# Patient Record
Sex: Male | Born: 1935 | Race: White | Hispanic: No | Marital: Married | Smoking: Former smoker
Health system: Southern US, Community
[De-identification: ages and names within clinical notes are randomized; demographics above are authoritative.]

## PROBLEM LIST (undated history)

## (undated) DIAGNOSIS — C439 Malignant melanoma of skin, unspecified: Secondary | ICD-10-CM

## (undated) DIAGNOSIS — IMO0001 Reserved for inherently not codable concepts without codable children: Secondary | ICD-10-CM

## (undated) DIAGNOSIS — E119 Type 2 diabetes mellitus without complications: Secondary | ICD-10-CM

## (undated) DIAGNOSIS — K219 Gastro-esophageal reflux disease without esophagitis: Secondary | ICD-10-CM

## (undated) DIAGNOSIS — H547 Unspecified visual loss: Secondary | ICD-10-CM

## (undated) DIAGNOSIS — R600 Localized edema: Secondary | ICD-10-CM

## (undated) DIAGNOSIS — I10 Essential (primary) hypertension: Secondary | ICD-10-CM

## (undated) HISTORY — PX: LUNG SURGERY: SHX703

## (undated) HISTORY — PX: INGUINAL HERNIA REPAIR: SUR1180

## (undated) HISTORY — PX: EYE SURGERY: SHX253

---

## 1999-01-02 ENCOUNTER — Ambulatory Visit (HOSPITAL_COMMUNITY): Admission: RE | Admit: 1999-01-02 | Discharge: 1999-01-02 | Payer: Self-pay

## 1999-01-30 ENCOUNTER — Encounter: Payer: Self-pay | Admitting: Thoracic Surgery

## 1999-01-31 ENCOUNTER — Inpatient Hospital Stay (HOSPITAL_COMMUNITY): Admission: RE | Admit: 1999-01-31 | Discharge: 1999-02-04 | Payer: Self-pay | Admitting: Thoracic Surgery

## 1999-01-31 ENCOUNTER — Encounter: Payer: Self-pay | Admitting: Thoracic Surgery

## 1999-02-01 ENCOUNTER — Encounter: Payer: Self-pay | Admitting: Thoracic Surgery

## 1999-02-02 ENCOUNTER — Encounter: Payer: Self-pay | Admitting: Thoracic Surgery

## 1999-02-03 ENCOUNTER — Encounter: Payer: Self-pay | Admitting: Thoracic Surgery

## 1999-02-07 ENCOUNTER — Encounter: Payer: Self-pay | Admitting: Thoracic Surgery

## 1999-02-07 ENCOUNTER — Encounter: Admission: RE | Admit: 1999-02-07 | Discharge: 1999-02-07 | Payer: Self-pay | Admitting: Thoracic Surgery

## 1999-03-14 ENCOUNTER — Encounter: Payer: Self-pay | Admitting: Thoracic Surgery

## 1999-03-14 ENCOUNTER — Encounter: Admission: RE | Admit: 1999-03-14 | Discharge: 1999-03-14 | Payer: Self-pay | Admitting: Thoracic Surgery

## 1999-05-16 ENCOUNTER — Encounter: Payer: Self-pay | Admitting: Thoracic Surgery

## 1999-05-16 ENCOUNTER — Encounter: Admission: RE | Admit: 1999-05-16 | Discharge: 1999-05-16 | Payer: Self-pay | Admitting: Thoracic Surgery

## 1999-09-14 ENCOUNTER — Encounter: Payer: Self-pay | Admitting: Thoracic Surgery

## 1999-09-14 ENCOUNTER — Encounter: Admission: RE | Admit: 1999-09-14 | Discharge: 1999-09-14 | Payer: Self-pay | Admitting: Thoracic Surgery

## 2000-03-14 ENCOUNTER — Ambulatory Visit (HOSPITAL_BASED_OUTPATIENT_CLINIC_OR_DEPARTMENT_OTHER): Admission: RE | Admit: 2000-03-14 | Discharge: 2000-03-14 | Payer: Self-pay | Admitting: Surgery

## 2000-03-18 ENCOUNTER — Encounter: Payer: Self-pay | Admitting: Thoracic Surgery

## 2000-03-18 ENCOUNTER — Encounter: Admission: RE | Admit: 2000-03-18 | Discharge: 2000-03-18 | Payer: Self-pay | Admitting: Thoracic Surgery

## 2000-09-16 ENCOUNTER — Encounter: Payer: Self-pay | Admitting: Thoracic Surgery

## 2000-09-16 ENCOUNTER — Encounter: Admission: RE | Admit: 2000-09-16 | Discharge: 2000-09-16 | Payer: Self-pay | Admitting: Thoracic Surgery

## 2001-03-18 ENCOUNTER — Encounter: Payer: Self-pay | Admitting: Thoracic Surgery

## 2001-03-18 ENCOUNTER — Encounter: Admission: RE | Admit: 2001-03-18 | Discharge: 2001-03-18 | Payer: Self-pay | Admitting: Thoracic Surgery

## 2001-09-15 ENCOUNTER — Encounter: Payer: Self-pay | Admitting: Thoracic Surgery

## 2001-09-15 ENCOUNTER — Encounter: Admission: RE | Admit: 2001-09-15 | Discharge: 2001-09-15 | Payer: Self-pay | Admitting: Thoracic Surgery

## 2002-03-11 ENCOUNTER — Encounter: Payer: Self-pay | Admitting: Thoracic Surgery

## 2002-03-11 ENCOUNTER — Encounter: Admission: RE | Admit: 2002-03-11 | Discharge: 2002-03-11 | Payer: Self-pay | Admitting: Thoracic Surgery

## 2003-03-15 ENCOUNTER — Encounter: Admission: RE | Admit: 2003-03-15 | Discharge: 2003-03-15 | Payer: Self-pay | Admitting: Thoracic Surgery

## 2004-02-28 ENCOUNTER — Encounter: Admission: RE | Admit: 2004-02-28 | Discharge: 2004-02-28 | Payer: Self-pay | Admitting: Thoracic Surgery

## 2005-03-13 ENCOUNTER — Encounter: Admission: RE | Admit: 2005-03-13 | Discharge: 2005-03-13 | Payer: Self-pay | Admitting: Thoracic Surgery

## 2006-03-18 ENCOUNTER — Ambulatory Visit: Payer: Self-pay | Admitting: Thoracic Surgery

## 2006-03-18 ENCOUNTER — Encounter: Admission: RE | Admit: 2006-03-18 | Discharge: 2006-03-18 | Payer: Self-pay | Admitting: Thoracic Surgery

## 2010-04-30 ENCOUNTER — Other Ambulatory Visit: Payer: Self-pay | Admitting: Dermatology

## 2010-10-04 ENCOUNTER — Other Ambulatory Visit: Payer: Self-pay | Admitting: Dermatology

## 2013-02-16 ENCOUNTER — Other Ambulatory Visit: Payer: Self-pay | Admitting: Dermatology

## 2014-02-16 ENCOUNTER — Other Ambulatory Visit: Payer: Self-pay | Admitting: Dermatology

## 2014-05-12 ENCOUNTER — Other Ambulatory Visit: Payer: Self-pay | Admitting: Dermatology

## 2015-07-17 ENCOUNTER — Other Ambulatory Visit: Payer: Self-pay | Admitting: Urology

## 2015-07-24 ENCOUNTER — Encounter (HOSPITAL_COMMUNITY): Payer: Self-pay | Admitting: Emergency Medicine

## 2015-07-24 ENCOUNTER — Emergency Department (HOSPITAL_COMMUNITY): Payer: Medicare Other

## 2015-07-24 ENCOUNTER — Observation Stay (HOSPITAL_COMMUNITY)
Admission: EM | Admit: 2015-07-24 | Discharge: 2015-07-26 | Disposition: A | Discharge status: Home / Self Care | Payer: Medicare Other | Attending: Internal Medicine | Admitting: Internal Medicine

## 2015-07-24 DIAGNOSIS — I10 Essential (primary) hypertension: Secondary | ICD-10-CM | POA: Diagnosis not present

## 2015-07-24 DIAGNOSIS — I2510 Atherosclerotic heart disease of native coronary artery without angina pectoris: Secondary | ICD-10-CM | POA: Insufficient documentation

## 2015-07-24 DIAGNOSIS — E119 Type 2 diabetes mellitus without complications: Secondary | ICD-10-CM | POA: Insufficient documentation

## 2015-07-24 DIAGNOSIS — Z85820 Personal history of malignant melanoma of skin: Secondary | ICD-10-CM | POA: Diagnosis not present

## 2015-07-24 DIAGNOSIS — R0602 Shortness of breath: Secondary | ICD-10-CM | POA: Diagnosis present

## 2015-07-24 DIAGNOSIS — K5900 Constipation, unspecified: Secondary | ICD-10-CM | POA: Diagnosis not present

## 2015-07-24 DIAGNOSIS — Z87891 Personal history of nicotine dependence: Secondary | ICD-10-CM | POA: Diagnosis not present

## 2015-07-24 DIAGNOSIS — R0600 Dyspnea, unspecified: Secondary | ICD-10-CM | POA: Diagnosis present

## 2015-07-24 DIAGNOSIS — D509 Iron deficiency anemia, unspecified: Principal | ICD-10-CM | POA: Insufficient documentation

## 2015-07-24 DIAGNOSIS — D649 Anemia, unspecified: Secondary | ICD-10-CM | POA: Insufficient documentation

## 2015-07-24 HISTORY — DX: Type 2 diabetes mellitus without complications: E11.9

## 2015-07-24 HISTORY — DX: Malignant melanoma of skin, unspecified: C43.9

## 2015-07-24 HISTORY — DX: Essential (primary) hypertension: I10

## 2015-07-24 LAB — CBC WITH DIFFERENTIAL/PLATELET
BASOS PCT: 0 %
Basophils Absolute: 0 10*3/uL (ref 0.0–0.1)
EOS PCT: 2 %
Eosinophils Absolute: 0.2 10*3/uL (ref 0.0–0.7)
HEMATOCRIT: 27.6 % — AB (ref 39.0–52.0)
HEMOGLOBIN: 8 g/dL — AB (ref 13.0–17.0)
LYMPHS PCT: 18 %
Lymphs Abs: 1.7 10*3/uL (ref 0.7–4.0)
MCH: 21.6 pg — ABNORMAL LOW (ref 26.0–34.0)
MCHC: 29 g/dL — ABNORMAL LOW (ref 30.0–36.0)
MCV: 74.4 fL — AB (ref 78.0–100.0)
MONOS PCT: 8 %
Monocytes Absolute: 0.8 10*3/uL (ref 0.1–1.0)
NEUTROS PCT: 72 %
Neutro Abs: 6.8 10*3/uL (ref 1.7–7.7)
PLATELETS: 269 10*3/uL (ref 150–400)
RBC: 3.71 MIL/uL — AB (ref 4.22–5.81)
RDW: 16.7 % — ABNORMAL HIGH (ref 11.5–15.5)
WBC: 9.5 10*3/uL (ref 4.0–10.5)

## 2015-07-24 LAB — POC OCCULT BLOOD, ED: FECAL OCCULT BLD: NEGATIVE

## 2015-07-24 LAB — LIPID PANEL
CHOL/HDL RATIO: 3.5 ratio
CHOLESTEROL: 150 mg/dL (ref 0–200)
HDL: 43 mg/dL (ref >40)
LDL Cholesterol: 93 mg/dL (ref 0–99)
Triglycerides: 68 mg/dL (ref <150)
VLDL: 14 mg/dL (ref 0–40)

## 2015-07-24 LAB — COMPREHENSIVE METABOLIC PANEL
ALBUMIN: 3.2 g/dL — AB (ref 3.5–5.0)
ALT: 18 U/L (ref 17–63)
ANION GAP: 6 (ref 5–15)
AST: 24 U/L (ref 15–41)
Alkaline Phosphatase: 88 U/L (ref 38–126)
BUN: 17 mg/dL (ref 6–20)
CHLORIDE: 110 mmol/L (ref 101–111)
CO2: 22 mmol/L (ref 22–32)
Calcium: 9.6 mg/dL (ref 8.9–10.3)
Creatinine, Ser: 0.92 mg/dL (ref 0.61–1.24)
GFR calc non Af Amer: >60 mL/min (ref >60)
GLUCOSE: 117 mg/dL — AB (ref 65–99)
Potassium: 4 mmol/L (ref 3.5–5.1)
SODIUM: 138 mmol/L (ref 135–145)
Total Bilirubin: 0.4 mg/dL (ref 0.3–1.2)
Total Protein: 6.7 g/dL (ref 6.5–8.1)

## 2015-07-24 LAB — GLUCOSE, CAPILLARY: GLUCOSE-CAPILLARY: 143 mg/dL — AB (ref 65–99)

## 2015-07-24 LAB — TROPONIN I
Troponin I: <0.03 ng/mL (ref <0.03)
Troponin I: <0.03 ng/mL (ref <0.03)

## 2015-07-24 LAB — FERRITIN: Ferritin: 5 ng/mL — ABNORMAL LOW (ref 24–336)

## 2015-07-24 LAB — BRAIN NATRIURETIC PEPTIDE: B Natriuretic Peptide: 293 pg/mL — ABNORMAL HIGH (ref 0.0–100.0)

## 2015-07-24 LAB — D-DIMER, QUANTITATIVE: D-Dimer, Quant: 2.16 ug/mL-FEU — ABNORMAL HIGH (ref 0.00–0.50)

## 2015-07-24 MED ORDER — PANTOPRAZOLE SODIUM 40 MG PO TBEC
40.0000 mg | DELAYED_RELEASE_TABLET | Freq: Every day | ORAL | Status: DC
  Administered 2015-07-25 – 2015-07-26 (×2): 40 mg via ORAL
  Filled 2015-07-24 (×2): qty 1

## 2015-07-24 MED ORDER — MOMETASONE FURO-FORMOTEROL FUM 200-5 MCG/ACT IN AERO
2.0000 | INHALATION_SPRAY | Freq: Two times a day (BID) | RESPIRATORY_TRACT | Status: DC
  Administered 2015-07-25: 2 via RESPIRATORY_TRACT
  Filled 2015-07-24 (×3): qty 8.8

## 2015-07-24 MED ORDER — DOXAZOSIN MESYLATE 8 MG PO TABS
4.0000 mg | ORAL_TABLET | Freq: Every day | ORAL | Status: DC
  Administered 2015-07-25 – 2015-07-26 (×2): 4 mg via ORAL
  Filled 2015-07-24 (×2): qty 1

## 2015-07-24 MED ORDER — SODIUM CHLORIDE 0.9% FLUSH
3.0000 mL | Freq: Two times a day (BID) | INTRAVENOUS | Status: DC
  Administered 2015-07-24 – 2015-07-26 (×5): 3 mL via INTRAVENOUS

## 2015-07-24 MED ORDER — LOSARTAN POTASSIUM 50 MG PO TABS
100.0000 mg | ORAL_TABLET | Freq: Every day | ORAL | Status: DC
Start: 2015-07-25 — End: 2015-07-26
  Administered 2015-07-25 – 2015-07-26 (×2): 100 mg via ORAL
  Filled 2015-07-24 (×2): qty 2

## 2015-07-24 MED ORDER — INSULIN GLARGINE 100 UNIT/ML ~~LOC~~ SOLN
10.0000 [IU] | Freq: Every day | SUBCUTANEOUS | Status: DC
  Administered 2015-07-24: 10 [IU] via SUBCUTANEOUS
  Filled 2015-07-24 (×2): qty 0.1

## 2015-07-24 MED ORDER — IOPAMIDOL (ISOVUE-370) INJECTION 76%
INTRAVENOUS | Status: AC
  Administered 2015-07-24: 75 mL via INTRAVENOUS
  Filled 2015-07-24: qty 100

## 2015-07-24 MED ORDER — ATORVASTATIN CALCIUM 40 MG PO TABS
40.0000 mg | ORAL_TABLET | Freq: Every day | ORAL | Status: DC
  Administered 2015-07-24 – 2015-07-25 (×2): 40 mg via ORAL
  Filled 2015-07-24 (×2): qty 1

## 2015-07-24 MED ORDER — FUROSEMIDE 10 MG/ML IJ SOLN: 20.0000 mg | Freq: Once | INTRAMUSCULAR | Status: DC

## 2015-07-24 MED ORDER — ENOXAPARIN SODIUM 40 MG/0.4ML ~~LOC~~ SOLN
40.0000 mg | SUBCUTANEOUS | Status: DC
  Administered 2015-07-24 – 2015-07-25 (×2): 40 mg via SUBCUTANEOUS
  Filled 2015-07-24 (×2): qty 0.4

## 2015-07-24 MED ORDER — INSULIN ASPART 100 UNIT/ML ~~LOC~~ SOLN
0.0000 [IU] | Freq: Three times a day (TID) | SUBCUTANEOUS | Status: DC
Start: 2015-07-24 — End: 2015-07-26
  Administered 2015-07-25: 5 [IU] via SUBCUTANEOUS
  Administered 2015-07-25: 2 [IU] via SUBCUTANEOUS
  Administered 2015-07-25 – 2015-07-26 (×2): 5 [IU] via SUBCUTANEOUS
  Administered 2015-07-26: 3 [IU] via SUBCUTANEOUS

## 2015-07-24 MED ORDER — AMLODIPINE BESYLATE 10 MG PO TABS
10.0000 mg | ORAL_TABLET | Freq: Every day | ORAL | Status: DC
  Administered 2015-07-25 – 2015-07-26 (×2): 10 mg via ORAL
  Filled 2015-07-24 (×2): qty 1

## 2015-07-24 MED ORDER — INSULIN ASPART 100 UNIT/ML ~~LOC~~ SOLN
0.0000 [IU] | Freq: Every day | SUBCUTANEOUS | Status: DC
Start: 2015-07-24 — End: 2015-07-26
  Administered 2015-07-24 – 2015-07-25 (×2): 3 [IU] via SUBCUTANEOUS

## 2015-07-24 NOTE — ED Notes (Signed)
Chaperoned Carmelina Paddock', RN with a rectal examination and collection of stool on patient; daughter stepped outside of room

## 2015-07-24 NOTE — H&P (Signed)
Date: 07/24/2015               Patient Name:  Martin Leblanc MRN: [id number]  DOB: 1929-05-04 Age / Sex: 82 y.o., male   PCP: No primary care provider on file.         Medical Service: Internal Medicine Teaching Service         Attending Physician: Dr. Oval Linsey, MD    First Contact: Melissa Montane, MS4 Pager: [phone]  Second Contact: Dr. Arcelia Jew Pager: [phone]       After Hours (After 5p/  First Contact Pager: [phone]  weekends / holidays): Second Contact Pager: [phone]   Chief Complaint: "I feel short of breath"  History of Present Illness: [name] is an 82yo man with PMHx of HTN, type 2 DM, and malignant melanoma treated 25 years ago who presents today with shortness of breath. He reports worsening SOB over the last 1.5 years and that he has noticed he has to limit his activity level. He describes that even taking a bath recently caused him to have SOB and that "it was a lot of work." He also reports a "fullness" in his chest that occurred at 3AM the night before, which woke him from sleep. He describes this discomfort as more pressure-like than pain, 3/10 in severity, substernal, and non-radiating. He notes experiencing this chest fullness intermittently over the past 6 months. He reports he took aspirin the night before, which he believes helped alleviate the pain.  He states that he currently feels completely fine and is no longer experiencing this discomfort.  He denies associated nausea or diaphoresis. He denies any orthopnea.  Meds: No current facility-administered medications for this encounter.   Current Outpatient Prescriptions  Medication Sig Dispense Refill  . amLODipine-benazepril (LOTREL) 10-20 MG capsule Take 1 capsule by mouth daily.    Marland Kitchen antiseptic oral rinse (BIOTENE) LIQD 15 mLs by Mouth Rinse route as needed for dry mouth.    Marland Kitchen atorvastatin (LIPITOR) 20 MG tablet Take 20 mg by mouth daily.    Marland Kitchen doxazosin (CARDURA) 4 MG tablet Take 4 mg by mouth daily.      Marland Kitchen HUMALOG MIX 50/50 KWIKPEN (50-50) 100 UNIT/ML Kwikpen Inject 40-50 Units into the skin See admin instructions. Pt uses 40 units at breakfast and dinner and if BS is high, uses 50 units at bedtime    . Hypromellose (ARTIFICIAL TEARS OP) Place 1 drop into both eyes daily as needed (dry eyes).    Marland Kitchen losartan (COZAAR) 100 MG tablet Take 100 mg by mouth daily.    Marland Kitchen omeprazole (PRILOSEC) 20 MG capsule Take 20 mg by mouth daily.    . solifenacin (VESICARE) 10 MG tablet Take 10 mg by mouth daily.    Marland Kitchen Specialty Vitamins Products (PROSTATE PO) Take 1 capsule by mouth daily.    . SYMBICORT 160-4.5 MCG/ACT inhaler Inhale 1-2 puffs into the lungs 2 (two) times daily.  4    Allergies: Allergies as of 07/24/2015  . (No Known Allergies)   Past Medical History  Diagnosis Date  . Diabetes mellitus without complication ([place])   . Hypertension   . Malignant melanoma ([place])     s/p treatment 25 yrs ago    Family History:  Diabetes in maternal grandmother. Emphysema in father.  Social History:  Lives at home by himself. Retired, used to work as a Dealer. Previous smoker, 0.5 ppd for 20 years, quit 45 years ago. Denies any alcohol use, last drank  40+ years ago. Denies any illicit drug use.  Review of Systems: A complete ROS was negative except as per HPI.   Physical Exam: Blood pressure 119/73, pulse 89, temperature 98.8 F (37.1 C), temperature source Oral, resp. rate 18, SpO2 97 %. General: pleasant elderly man sitting up in bed, NAD HEENT: [place]/AT, EOMI, sclera anicteric, erythematous pharynx, mucus membranes dry CV: heart sounds distant, RRR, no m/g/r Pulm: mild crackles at bases, breaths non-labored on room air Abd: BS+, soft, obese, non-tender Ext: warm, 2+ pitting edema in lower extremities bilaterally Neuro: alert and oriented x 3  Labs:  Hgb 8.0, MCV 74 Cr 0.92 BNP 293 D-dimer 2.16 Trop neg x 1 FOBT neg Ferritin 5  EKG: Normal sinus rhythm. No ST or T wave changes.   CXR:  Increased masslike thickening along the right chest wall  CT Angio Chest: No PE. Right lower scarring, atelectasis, and small effusion. Based on prior x-rays most likely chronic. 3 cm rounded low-attenuation lesion in RLL, possible loculated pleural fluid. Emphysematous changes. Borderline mediastinal and hilar lymph nodes. CT chest in 3 months to re-evaluate lymph nodes and RLL lesion recommended.   Assessment & Plan by Problem:  Dyspnea on Exertion with Chest Discomfort: Mr. Polek presents with a 1.5 year history of worsening DOE that has acutely worsened over the past few weeks. Additionally, he is having chest discomfort and PND. He has 2+ edema in his lower extremities and mild crackles at his lung bases, but does not seem overtly volume overloaded. BNP only 293 and chest x-ray without pulmonary edema. I am concerned that his symptoms may be related to a cardiac etiology. He has cardiac risk factors including HTN and type 2 DM. His chest discomfort seems more pressure-like in nature which is concerning for CAD. Initial troponin negative and EKG without ischemic changes. His DOE could represent new onset heart failure as well. Will trend serial troponins and repeat an EKG in the morning. His symptoms could also be related to his iron deficiency anemia. I do not have any baseline hemoglobin on him to determine if he has dropped acutely but he denied any bleeding.  - Trend troponins - Repeat EKG in AM - Obtain Echocardiogram to assess LV function - Cardiac monitoring - Can consider giving Lasix 20 mg IV x 1 if becomes SOB - Continue home Amlodipine and Losartan - Continue home Lipitor - Trend weights and I&Os - Risk factor modification: check HbA1c and lipid panel   Iron Deficiency Anemia: Hemoglobin 8.0 on admission with MCV 74. His ferritin is 5. Reports he had "anemia as a teenager but then it went away." He reports previously being on iron supplementation but is not on any currently. He  reports he had a colonoscopy 5-6 years ago and had a few polyps removed which he believes were benign. Denies any melena, hematochezia, hematemesis, hemoptysis, or hematuria. Could be contributing to his DOE.  - Would benefit from repeat colonoscopy as outpatient - Can consider restarting oral iron therapy - CBC in AM  HTN: BPs stable in [id number] systolic. He is on Amlodipine-Benazepril combination pill and Losartan at home. - Will continue home Amlodipine and Losartan for tomorrow - Stop Benazepril. Unclear why on ACE and ARB.   Type 2 DM: Last A1c unknown. Glucose 117 on admission. He reports taking Humalog 50/50 mix 40 units BID with meals. He will occasionally take 50 units if blood sugar elevated.  - Check A1c - Place on Lantus 10 units QHS and sensitive  ISS  - Monitor blood sugars and adjust insulin accordingly   Diet: Heart healthy  VTE PPx: Lovenox SQ Dispo: Admit patient to Observation with expected length of stay less than 2 midnights.  Signed: Juliet Rude, MD 07/24/2015, 3:05 PM  Pager: [phone]

## 2015-07-24 NOTE — ED Notes (Addendum)
Patients Daughter Marisa Cyphers [phone]

## 2015-07-24 NOTE — ED Notes (Signed)
Patient present today with complaints of SOB. States he its been going on for 1 year and has been getting progressively worst. Patient states seen PCP32 months ago  and was given medications and no help. Patient speaking in full sentences. States feels a like SOB on arrival.

## 2015-07-24 NOTE — ED Notes (Signed)
Patient being transported at this time to Radiology Department

## 2015-07-24 NOTE — ED Provider Notes (Signed)
CSN: [id number]     Arrival date & time 07/24/15  0715 History   First MD Initiated Contact with Patient 07/24/15 [phone]     Chief Complaint  Patient presents with  . 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 The patient reports a one year history of progressively worsening shortness of breath which seems to be getting worse recently. His shortness of breath is worse with exertion. He endorses paroxysmal nocturnal dyspnea and some orthopnea. No significant lower extremity edema or changes in his weight. No fevers. Mild, chronic cough. He reports some chest "fullness" that occurred around 3 AM this morning, for which he took a full aspirin. No chest pain currently. He reports a distant history of smoking. He reports a chronic history of dry mouth. He denies any black or bloody stools, or any hematuria.  Patient is a 82 y.o. male presenting with shortness of breath. The history is provided by the patient.  Shortness of Breath   Past Medical History  Diagnosis Date  . Diabetes mellitus without complication ([place])   . Hypertension    No past surgical history on file. No family history on file. Social History  Substance Use Topics  . Smoking status: Former Research scientist (life sciences)  . Smokeless tobacco: None  . Alcohol Use: Yes    Review of Systems  Respiratory: Positive for shortness of breath.    10 Systems reviewed and are negative for acute change except as noted in the HPI.    Allergies  Review of patient's allergies indicates no known allergies.  Home Medications   Prior to Admission medications   Not on File   BP 114/64 mmHg  Pulse 86  Temp(Src) 98.8 F (37.1 C) (Oral)  Resp 17  SpO2 95% Physical Exam  Constitutional: He is  oriented to person, place, and time. He appears well-developed and well-nourished. No distress.  HENT:  Head: Normocephalic and atraumatic.  Very dry mouth and red soft palate  Eyes: Conjunctivae are normal.  L pupil irregular, R pupil round and reactive to light  Neck: Neck supple.  Cardiovascular: Normal rate and regular rhythm.   Murmur heard.  Systolic murmur is present with a grade of 3/6  Pulmonary/Chest: Effort normal and breath sounds normal. He has no wheezes.  Abdominal: Soft. Bowel sounds are normal. He exhibits no distension. There is no tenderness.  Musculoskeletal: He exhibits no edema.  Neurological: He is alert and oriented to person, place, and time.  Fluent speech  Skin: Skin is warm and dry.  Psychiatric: He has a normal mood and affect. Judgment normal.  Nursing note and vitals reviewed.   ED Course  Procedures (including critical care time) Labs Review Labs Reviewed  COMPREHENSIVE METABOLIC PANEL - Abnormal; Notable for the following:    Glucose, Bld 117 (*)    Albumin 3.2 (*)    All other components within normal limits  BRAIN NATRIURETIC PEPTIDE - Abnormal; Notable for the following:    B Natriuretic Peptide 293.0 (*)    All other components within normal limits  CBC WITH DIFFERENTIAL/PLATELET - Abnormal; Notable for the following:    RBC 3.71 (*)    Hemoglobin 8.0 (*)    HCT 27.6 (*)    MCV 74.4 (*)    MCH 21.6 (*)    MCHC 29.0 (*)  RDW 16.7 (*)    All other components within normal limits  D-DIMER, QUANTITATIVE (NOT AT [facility]) - Abnormal; Notable for the following:    D-Dimer, Quant 2.16 (*)    All other components within normal limits  TROPONIN I  POC OCCULT BLOOD, ED    Imaging Review Dg Chest 2 View  07/24/2015  CLINICAL DATA:  Shortness of breath. EXAM: CHEST  2 VIEW COMPARISON:  Chest radiograph 03/18/2006 FINDINGS: Along the RIGHT chest wall there is increase masslike thickening at site of prior post surgical change measuring 7.3 cm  compared to 2.7 cm. Small 2 cm locule of subcutaneous gas inferior to this chest wall expansion. Mild blunting of the RIGHT costophrenic angle. Cardiac silhouette is normal. Central hiatal hernia. Chronic bronchitic markings and basilar atelectasis. No pneumothorax. IMPRESSION: Increased masslike thickening along the RIGHT chest wall. Recommend CT of thorax with contrast for further characterization. Electronically Signed   By: Suzy Bouchard M.D.   On: 07/24/2015 08:21   Ct Angio Chest Pe W/cm &/or Wo Cm  07/24/2015  CLINICAL DATA:  Sudden onset of severe midline chest pain at 3:30 this morning. Chronic shortness of breath. History of melanoma. History of previous right-sided thoracic surgery. EXAM: CT ANGIOGRAPHY CHEST WITH CONTRAST TECHNIQUE: Multidetector CT imaging of the chest was performed using the standard protocol during bolus administration of intravenous contrast. Multiplanar CT image reconstructions and MIPs were obtained to evaluate the vascular anatomy. CONTRAST:  70 cc Isovue 370 COMPARISON:  Multiple prior chest x-rays. Prior chest CT from 01/02/1999 is not available. FINDINGS: Cardiovascular: The heart is normal in size for age. No pericardial effusion. There is tortuosity, ectasia and dense atherosclerotic calcification involving the thoracic aorta. No dissection or focal aneurysm. Calcifications also noted at the aortic valve and mitral valve annulus. Scattered coronary artery calcifications. The pulmonary arterial tree is fairly well opacified. No filling defects to suggest pulmonary embolism. Mediastinum/Nodes: Numerous small mediastinal and hilar lymph nodes. There is a larger 17 mm subcarinal node and borderline infrahilar lymph nodes bilaterally. 10 mm right hilar node on image 56. 9 mm infrahilar node on image number 51. Lungs/Pleura: Extensive pleural and parenchymal scarring changes in the right lower lobe likely related to prior surgery. There could be superimposed atelectasis or  infiltrate. There are also small bilateral pleural effusions. Rounded right lower lobe pulmonary lesion measures water attenuation and may be a loculated pleural fluid collection. Could not totally exclude the possibility of a necrotic nodule. It measures 32 mm on image number 75. Underline emphysematous changes and areas of pulmonary scarring. Upper Abdomen: No significant upper abdominal findings. Musculoskeletal: No significant bony findings. Probable postoperative scarring type changes involving the right chest wall musculature with fatty atrophy. This mainly involves the serratus anterior muscle. Review of the MIP images confirms the above findings. IMPRESSION: 1. No CT findings for pulmonary embolism. 2. Right lower scarring, atelectasis and small effusion. Based on prior chest x-rays most of this is likely chronic but could not exclude superimposed atelectasis or infiltrate. 3. 3 cm rounded low-attenuation lesion in the right lower lobe measures water attenuation and may be loculated pleural fluid. A necrotic nodule is also possible but less likely. 4. Emphysematous changes. 5. Borderline mediastinal and hilar lymph nodes. 6. Small to moderate-sized hiatal hernia. 7. Recommend short-term followup chest CT (3 months) to re-evaluate the lymph nodes and right lower lobe lesion. Electronically Signed   By: Marijo Sanes M.D.   On: 07/24/2015 09:57   I have personally reviewed  and evaluated these lab results as part of my medical decision-making.   EKG Interpretation   Date/Time:  Monday July 24 2015 07:26:09 EDT Ventricular Rate:  92 PR Interval:    QRS Duration: 89 QT Interval:  353 QTC Calculation: 437 R Axis:   59 Text Interpretation:  Sinus rhythm Borderline repolarization abnormality  No previous ECGs available Confirmed by [place] Welcome MD, Viliami Bracco [id number]) on  07/24/2015 7:33:09 AM Also confirmed by Ilyse Tremain MD, Michell Kader [id number]), editor  [place]  [facility], [place] ([id number])  on 07/24/2015 8:41:30 AM       MDM   Final diagnoses:  Exertional shortness of breath   Patient presents with 1 year history of progressively worsening shortness of breath. He was awake, alert, and comfortable on exam. Vital signs notable for O2 saturation 95% on room air. No abnormal lung sounds, systolic murmur noted. EKG without obvious ischemic changes. Chest x-ray shows increased masslike thickening along right chest wall. Because of his history of melanoma and smoking, as well as positive d-dimer, obtained CTA of chest to evaluate for mass or PE. Troponin negative. Hemoglobin 8, no previous available for comparison. Patient denies any bloody stools and Hemoccult negative.  CTA was negative for PE and showed scarring in the right lung. The patient does report history of previous lung surgery approximately 15 years ago. I informed him of lymphadenopathy and need for repeat CT scan in a few months. Regarding his shortness of breath, I am concerned about the exertional component of it as well as his symptoms of orthopnea and paroxysmal nocturnal dyspnea, despite his BNP less than 500. I'm concerned that his symptoms may represent CAD. I have discussed these concerns with the patient and he is in agreement to admission for further workup. I discussed with internal medicine teaching service, and patient admitted to Dr. Caroline More service for further care.  Sharlett Iles, MD 07/24/15 [phone]

## 2015-07-24 NOTE — ED Notes (Signed)
MD at bedside. 

## 2015-07-24 NOTE — ED Notes (Signed)
Patient sent to CT by another staff member.  

## 2015-07-24 NOTE — Care Management Note (Signed)
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  Action/Plan: Follow for disposition needs.   Expected Discharge Date:                  Expected Discharge Plan:  [place]  In-House Referral:     Discharge planning Services  CM Consult  Post Acute Care Choice:    Choice offered to:     DME Arranged:    DME Agency:     HH Arranged:    [place] Agency:     Status of Service:  In process, will continue to follow  If discussed at Long Length of Stay Meetings, dates discussed:    Additional Comments:  Fuller Mandril, RN 07/24/2015, 1:48 PM

## 2015-07-25 ENCOUNTER — Observation Stay (HOSPITAL_COMMUNITY): Payer: Medicare Other

## 2015-07-25 ENCOUNTER — Encounter (HOSPITAL_COMMUNITY): Payer: Self-pay | Admitting: *Deleted

## 2015-07-25 DIAGNOSIS — R0789 Other chest pain: Secondary | ICD-10-CM | POA: Diagnosis not present

## 2015-07-25 DIAGNOSIS — I10 Essential (primary) hypertension: Secondary | ICD-10-CM | POA: Diagnosis not present

## 2015-07-25 DIAGNOSIS — D649 Anemia, unspecified: Secondary | ICD-10-CM | POA: Diagnosis not present

## 2015-07-25 DIAGNOSIS — E119 Type 2 diabetes mellitus without complications: Secondary | ICD-10-CM | POA: Diagnosis not present

## 2015-07-25 DIAGNOSIS — Z85820 Personal history of malignant melanoma of skin: Secondary | ICD-10-CM | POA: Diagnosis not present

## 2015-07-25 DIAGNOSIS — R0609 Other forms of dyspnea: Secondary | ICD-10-CM | POA: Diagnosis not present

## 2015-07-25 DIAGNOSIS — D509 Iron deficiency anemia, unspecified: Secondary | ICD-10-CM | POA: Diagnosis not present

## 2015-07-25 DIAGNOSIS — Z794 Long term (current) use of insulin: Secondary | ICD-10-CM

## 2015-07-25 LAB — URINALYSIS, ROUTINE W REFLEX MICROSCOPIC
BILIRUBIN URINE: NEGATIVE
Glucose, UA: 500 mg/dL — AB
Hgb urine dipstick: NEGATIVE
KETONES UR: NEGATIVE mg/dL
NITRITE: NEGATIVE
Protein, ur: NEGATIVE mg/dL
SPECIFIC GRAVITY, URINE: 1.019 (ref 1.005–1.030)
pH: 6.5 (ref 5.0–8.0)

## 2015-07-25 LAB — URINE MICROSCOPIC-ADD ON

## 2015-07-25 LAB — CBC
HCT: 27.4 % — ABNORMAL LOW (ref 39.0–52.0)
HEMATOCRIT: 26.4 % — AB (ref 39.0–52.0)
HEMOGLOBIN: 7.6 g/dL — AB (ref 13.0–17.0)
HEMOGLOBIN: 8.1 g/dL — AB (ref 13.0–17.0)
MCH: 20.8 pg — AB (ref 26.0–34.0)
MCH: 21.5 pg — ABNORMAL LOW (ref 26.0–34.0)
MCHC: 28.8 g/dL — AB (ref 30.0–36.0)
MCHC: 29.6 g/dL — ABNORMAL LOW (ref 30.0–36.0)
MCV: 72.1 fL — ABNORMAL LOW (ref 78.0–100.0)
MCV: 72.7 fL — AB (ref 78.0–100.0)
PLATELETS: 261 10*3/uL (ref 150–400)
Platelets: 269 10*3/uL (ref 150–400)
RBC: 3.66 MIL/uL — ABNORMAL LOW (ref 4.22–5.81)
RBC: 3.77 MIL/uL — AB (ref 4.22–5.81)
RDW: 16.7 % — ABNORMAL HIGH (ref 11.5–15.5)
RDW: 17.2 % — ABNORMAL HIGH (ref 11.5–15.5)
WBC: 9.2 10*3/uL (ref 4.0–10.5)
WBC: 9.1 10*3/uL (ref 4.0–10.5)

## 2015-07-25 LAB — BASIC METABOLIC PANEL
Anion gap: 6 (ref 5–15)
BUN: 17 mg/dL (ref 6–20)
CHLORIDE: 107 mmol/L (ref 101–111)
CO2: 23 mmol/L (ref 22–32)
CREATININE: 0.89 mg/dL (ref 0.61–1.24)
Calcium: 9.1 mg/dL (ref 8.9–10.3)
GFR calc Af Amer: >60 mL/min (ref >60)
GFR calc non Af Amer: >60 mL/min (ref >60)
GLUCOSE: 184 mg/dL — AB (ref 65–99)
Potassium: 4.2 mmol/L (ref 3.5–5.1)
Sodium: 136 mmol/L (ref 135–145)

## 2015-07-25 LAB — GLUCOSE, CAPILLARY
GLUCOSE-CAPILLARY: 174 mg/dL — AB (ref 65–99)
GLUCOSE-CAPILLARY: 256 mg/dL — AB (ref 65–99)
GLUCOSE-CAPILLARY: 257 mg/dL — AB (ref 65–99)
GLUCOSE-CAPILLARY: 272 mg/dL — AB (ref 65–99)

## 2015-07-25 LAB — HEMOGLOBIN A1C
HEMOGLOBIN A1C: 8 % — AB (ref 4.8–5.6)
Mean Plasma Glucose: 183 mg/dL

## 2015-07-25 LAB — ABO/RH: ABO/RH(D): B NEG

## 2015-07-25 LAB — TROPONIN I

## 2015-07-25 LAB — PREPARE RBC (CROSSMATCH)

## 2015-07-25 MED ORDER — INSULIN GLARGINE 100 UNIT/ML ~~LOC~~ SOLN
12.0000 [IU] | Freq: Every day | SUBCUTANEOUS | Status: DC
  Administered 2015-07-25: 12 [IU] via SUBCUTANEOUS
  Filled 2015-07-25 (×2): qty 0.12

## 2015-07-25 MED ORDER — SODIUM CHLORIDE 0.9 % IV SOLN
Freq: Once | INTRAVENOUS | Status: AC
  Administered 2015-07-25: 17:00:00 via INTRAVENOUS

## 2015-07-25 MED ORDER — SENNOSIDES-DOCUSATE SODIUM 8.6-50 MG PO TABS
1.0000 | ORAL_TABLET | Freq: Every day | ORAL | Status: DC | PRN
  Administered 2015-07-25: 1 via ORAL
  Filled 2015-07-25: qty 1

## 2015-07-25 NOTE — Progress Notes (Signed)
Paged physician to request order for Milk of Magnesia for constipation, as pt takes this at home.  Physician did not return call.  Will report to PM nurse for same request to physicians.

## 2015-07-25 NOTE — H&P (Signed)
Internal Medicine Attending Admission Note Date: 07/25/2015  Patient name: Martin Leblanc Medical record number: [id number] Date of birth: 21-Sep-1929 Age: 82 y.o. Gender: male  I saw and evaluated the patient. I reviewed the resident's note and I agree with the resident's findings and plan as documented in the resident's note.  Chief Complaint(s): Progressive shortness of breath over 2 years.  History - key components related to admission:  Mr. Martin Leblanc is an 82 year old man with a history of diabetes, hypertension, and prior malignant melanoma who presents with a two-year history of progressive shortness of breath, particularly with exertion. He has recently had pressure-like substernal pain that is nonradiating and not associated with dizziness or diaphoresis. He had one episode of waking up in the middle of the night short of breath but denies any other similar episodes or orthopnea. He also denies melanic stools or bright red blood per rectum and had a colonoscopy 6-7 years ago per his report that was only notable for polyps that were presumably benign although we do not have records to confirm this. Finally, he has a history of a previous hamartoma of the right lower lobe which was removed in the remote past. Given the progression of his shortness of breath that occurs with minimal activity he presented to the emergency department for further evaluation and was admitted to the internal medicine teaching service.  Physical Exam - key components related to admission:  Filed Vitals:   07/24/15 1531 07/24/15 2024 07/25/15 0441 07/25/15 1135  BP: 127/72 116/57 127/69 118/61  Pulse: 90 91 91 90  Temp: 98.1 F (36.7 C) 98.4 F (36.9 C) 98.4 F (36.9 C) 98.4 F (36.9 C)  TempSrc: Oral Oral Oral Oral  Resp: 18 18 18 16   Height: 6' (1.829 m)     Weight: 233 lb (105.688 kg)  230 lb 14.4 oz (104.736 kg)   SpO2: 95% 93% 91% 92%   Gen.: Well-developed, well-nourished, man sitting comfortably in bed  eating breakfast in no acute distress. HEENT: No conjunctival pallor. Lungs: Right basilar inspiratory crackles without wheezes, rhonchi, or rales. Heart: Regular rate and rhythm without murmurs, rubs, or gallops. Extremities: 1+ pitting edema bilaterally.  Lab results:  Basic Metabolic Panel:  Recent Labs  07/24/15 0741 07/25/15 0318  NA 138 136  K 4.0 4.2  CL 110 107  CO2 22 23  GLUCOSE 117* 184*  BUN 17 17  CREATININE 0.92 0.89  CALCIUM 9.6 9.1   Liver Function Tests:  Recent Labs  07/24/15 0741  AST 24  ALT 18  ALKPHOS 88  BILITOT 0.4  PROT 6.7  ALBUMIN 3.2*   CBC:  Recent Labs  07/24/15 0741 07/25/15 0318  WBC 9.5 9.2  NEUTROABS 6.8  --   HGB 8.0* 7.6*  HCT 27.6* 26.4*  MCV 74.4* 72.1*  PLT 269 269   Cardiac Enzymes:  Recent Labs  07/24/15 0741 07/24/15 1900 07/25/15  TROPONINI <0.03 <0.03 <0.03   D-Dimer:  Recent Labs  07/24/15 0741  DDIMER 2.16*   CBG:  Recent Labs  07/24/15 1549 07/25/15 0608  GLUCAP 143* 174*   Hemoglobin A1C:  Recent Labs  07/24/15 1553  HGBA1C 8.0*   Fasting Lipid Panel:  Recent Labs  07/24/15 1553  CHOL 150  HDL 43  LDLCALC 93  TRIG 68  CHOLHDL 3.5   Anemia Panel:  Recent Labs  07/24/15 1553  FERRITIN 5*   Misc. Labs:  BNP 293 Fecal occult blood negative  Imaging results:  Dg Chest  2 View  07/24/2015  CLINICAL DATA:  Shortness of breath. EXAM: CHEST  2 VIEW COMPARISON:  Chest radiograph 03/18/2006 FINDINGS: Along the RIGHT chest wall there is increase masslike thickening at site of prior post surgical change measuring 7.3 cm compared to 2.7 cm. Small 2 cm locule of subcutaneous gas inferior to this chest wall expansion. Mild blunting of the RIGHT costophrenic angle. Cardiac silhouette is normal. Central hiatal hernia. Chronic bronchitic markings and basilar atelectasis. No pneumothorax. IMPRESSION: Increased masslike thickening along the RIGHT chest wall. Recommend CT of thorax with  contrast for further characterization. Electronically Signed   By: Suzy Bouchard M.D.   On: 07/24/2015 08:21   Ct Angio Chest Pe W/cm &/or Wo Cm  07/24/2015  CLINICAL DATA:  Sudden onset of severe midline chest pain at 3:30 this morning. Chronic shortness of breath. History of melanoma. History of previous right-sided thoracic surgery. EXAM: CT ANGIOGRAPHY CHEST WITH CONTRAST TECHNIQUE: Multidetector CT imaging of the chest was performed using the standard protocol during bolus administration of intravenous contrast. Multiplanar CT image reconstructions and MIPs were obtained to evaluate the vascular anatomy. CONTRAST:  70 cc Isovue 370 COMPARISON:  Multiple prior chest x-rays. Prior chest CT from 01/02/1999 is not available. FINDINGS: Cardiovascular: The heart is normal in size for age. No pericardial effusion. There is tortuosity, ectasia and dense atherosclerotic calcification involving the thoracic aorta. No dissection or focal aneurysm. Calcifications also noted at the aortic valve and mitral valve annulus. Scattered coronary artery calcifications. The pulmonary arterial tree is fairly well opacified. No filling defects to suggest pulmonary embolism. Mediastinum/Nodes: Numerous small mediastinal and hilar lymph nodes. There is a larger 17 mm subcarinal node and borderline infrahilar lymph nodes bilaterally. 10 mm right hilar node on image 56. 9 mm infrahilar node on image number 51. Lungs/Pleura: Extensive pleural and parenchymal scarring changes in the right lower lobe likely related to prior surgery. There could be superimposed atelectasis or infiltrate. There are also small bilateral pleural effusions. Rounded right lower lobe pulmonary lesion measures water attenuation and may be a loculated pleural fluid collection. Could not totally exclude the possibility of a necrotic nodule. It measures 32 mm on image number 75. Underline emphysematous changes and areas of pulmonary scarring. Upper Abdomen: No  significant upper abdominal findings. Musculoskeletal: No significant bony findings. Probable postoperative scarring type changes involving the right chest wall musculature with fatty atrophy. This mainly involves the serratus anterior muscle. Review of the MIP images confirms the above findings. IMPRESSION: 1. No CT findings for pulmonary embolism. 2. Right lower scarring, atelectasis and small effusion. Based on prior chest x-rays most of this is likely chronic but could not exclude superimposed atelectasis or infiltrate. 3. 3 cm rounded low-attenuation lesion in the right lower lobe measures water attenuation and may be loculated pleural fluid. A necrotic nodule is also possible but less likely. 4. Emphysematous changes. 5. Borderline mediastinal and hilar lymph nodes. 6. Small to moderate-sized hiatal hernia. 7. Recommend short-term followup chest CT (3 months) to re-evaluate the lymph nodes and right lower lobe lesion. Electronically Signed   By: Marijo Sanes M.D.   On: 07/24/2015 09:57   Other results:  EKG: Normal sinus rhythm at 92 bpm, normal axis, first-degree AV block, no significant Q waves, no LVH by voltage, inferolateral T wave flattening, no comparisons immediately available.  Assessment & Plan by Problem:  Mr. Rama is an 82 year old man with a history of diabetes, hypertension, and prior malignant melanoma who presents with a two-year  history of progressive shortness of breath, particularly with exertion. Given the prolonged nature of his dyspnea on exertion and underlying microcytic anemia that is proven to be iron deficient in etiology our main concern is that his dyspnea is secondary to symptomatic anemia given a hemoglobin level just north of 7.5. The differential includes coronary ischemia or an anginal equivalent related to an underlying fixed coronary defect in the setting of anemia, so-called demand ischemia. Thus, he is likely to respond to a blood transfusion from a symptomatic  standpoint. More importantly, we need to figure out the cause of the iron deficiency and treat that. If the patient does not respond to a blood transfusion with an increase in his hemoglobin then a significant fixed coronary obstruction needs to be considered and possibly intervened upon.  Plan  1) Symptomatic anemia: We will transfuse with 1 unit of packed red blood cells and assess his symptoms with an increase in his hemoglobin. If he remains symptomatic we will then consider an underlying significant/symptomatic fixed coronary lesion and the symptoms being an anginal equivalent.  2) Iron deficiency anemia: We will initiate iron therapy at discharge. He will require a repeat colonoscopy to assure he is not losing blood secondary to a colon adenocarcinoma given his age and iron deficiency. We will also obtain a urinalysis to assure he is not losing blood occultly through the urinary tract.  3) Disposition: I anticipate we will have a better idea of his underlying mechanism for dyspnea on exertion after he receives the blood transfusion. Other than the urinalysis, the above evaluation can be finished as an outpatient. Therefore I anticipate he will be stable for discharge home tomorrow if he response symptomatically to the transfusion.

## 2015-07-25 NOTE — Progress Notes (Signed)
   Subjective: No acute events overnight. Reports his breathing has not improved. Denies any chest pain.  Objective: Vital signs in last 24 hours: Filed Vitals:   07/24/15 1430 07/24/15 1531 07/24/15 2024 07/25/15 0441  BP:  127/72 116/57 127/69  Pulse:  90 91 91  Temp:  98.1 F (36.7 C) 98.4 F (36.9 C) 98.4 F (36.9 C)  TempSrc:  Oral Oral Oral  Resp:  18 18 18   Height:  6' (1.829 m)    Weight:  233 lb (105.688 kg)  230 lb 14.4 oz (104.736 kg)  SpO2: 97% 95% 93% 91%   Physical Exam General: elderly man sitting up eating breakfast, NAD HEENT: [place]/AT, EOMI, sclera anicteric, mucus membranes moist CV: RRR, no m/g/r Pulm: CTA bilaterally, breaths non-labored Abd: BS+, soft, obese, non-tender Ext: 1-2+ pitting edema in lower extremities Neuro: alert and oriented x 3  Assessment/Plan:  Iron Deficiency Anemia Causing Dyspnea: His dyspnea is less likely to be cardiac in nature given his normal EKG and negative troponins. His peripheral swelling is most likely venous insufficiency. Will still get an echocardiogram to rule out heart failure. He does have significant lung disease with his scarring from previous surgery and possible emphysematous changes from prior smoking. I think what is most concerning is his iron deficiency anemia. Do not know his baseline Hgb but his hemoglobin is currently 7.4 (8.0 on admission) and given the very slow progressive nature of his symptoms this is the most likely cause for his worsening dyspnea. Will transfuse him with 1 unit PRBCs once his echo results are back. - f/u echo - Transfuse 1 unit PRBCs (essentially getting iron replacement from blood so feraheme not indicated) - Oral iron therapy as outpatient  Iron Deficiency Anemia: Hgb 7.4 this morning, 8.0 on admission. Ferritin 5. No active bleeding. Likely cause for his dyspnea as above. He will need repeat colonoscopy as his last one was 5-6 years ago and he did have polyps on that one.  - Transfuse 1  unit PRBCs  - f/u post transfusion H&H - Start oral iron therapy upon discharge  - Colonoscopy as outpatient   HTN: BPs stable in 120s.  - Continue home Amlodipine and Losartan  Type 2 DM: His A1c is 8.0. Blood sugar 174 this morning.  - Increase Lantus to 12 units QHS - Continue sensitive ISS  Diet: Heart healthy DVT Ppx: Lovenox SQ Dispo: Anticipated discharge in approximately 1-2 day(s).     Juliet Rude, MD 07/25/2015, 10:13 AM Pager: [phone]

## 2015-07-25 NOTE — Progress Notes (Signed)
Pt. Refusing bed alarm at this time. Pt. Verbalized understanding of pt. Fall safety plan.

## 2015-07-26 ENCOUNTER — Observation Stay (HOSPITAL_BASED_OUTPATIENT_CLINIC_OR_DEPARTMENT_OTHER): Payer: Medicare Other

## 2015-07-26 DIAGNOSIS — D649 Anemia, unspecified: Secondary | ICD-10-CM | POA: Diagnosis not present

## 2015-07-26 DIAGNOSIS — R0600 Dyspnea, unspecified: Secondary | ICD-10-CM | POA: Diagnosis not present

## 2015-07-26 LAB — ECHOCARDIOGRAM COMPLETE
AOPV: 0.27 m/s
AOVTI: 89.4 cm
AV Area VTI index: 0.41 cm2/m2
AV Area VTI: 0.95 cm2
AV peak Index: 0.41
AV vel: 0.95
AVAREAMEANV: 0.88 cm2
AVAREAMEANVIN: 0.38 cm2/m2
AVCELMEANRAT: 0.25
AVG: 34 mmHg
AVPG: 54 mmHg
AVPKVEL: 368 cm/s
CHL CUP AV VALUE AREA INDEX: 0.41
DOP CAL AO MEAN VELOCITY: 273 cm/s
FS: 38 % (ref 28–44)
Height: 72 in
IV/PV OW: 0.92
LA ID, A-P, ES: 45 mm
LA diam end sys: 45 mm
LA vol index: 34.8 mL/m2
LA vol: 81.4 mL
LADIAMINDEX: 1.92 cm/m2
LAVOLA4C: 73 mL
LV TDI E'LATERAL: 7.94
LV e' LATERAL: 7.94 cm/s
LVOT SV: 85 mL
LVOT VTI: 24.6 cm
LVOT area: 3.46 cm2
LVOT peak grad rest: 4 mmHg
LVOT peak vel: 101 cm/s
LVOTD: 21 mm
LVOTVTI: 0.28 cm
MV pk E vel: 0.9 m/s
PW: 12 mm — AB (ref 0.6–1.1)
RV LATERAL S' VELOCITY: 16 cm/s
RV TAPSE: 23.9 mm
Valve area: 0.95 cm2
Weight: 3712 oz

## 2015-07-26 LAB — CBC
HCT: 29.4 % — ABNORMAL LOW (ref 39.0–52.0)
HEMOGLOBIN: 8.7 g/dL — AB (ref 13.0–17.0)
MCH: 21.5 pg — ABNORMAL LOW (ref 26.0–34.0)
MCHC: 29.6 g/dL — AB (ref 30.0–36.0)
MCV: 72.8 fL — ABNORMAL LOW (ref 78.0–100.0)
PLATELETS: 262 10*3/uL (ref 150–400)
RBC: 4.04 MIL/uL — ABNORMAL LOW (ref 4.22–5.81)
RDW: 16.9 % — AB (ref 11.5–15.5)
WBC: 9.5 10*3/uL (ref 4.0–10.5)

## 2015-07-26 LAB — TYPE AND SCREEN
ABO/RH(D): B NEG
Antibody Screen: NEGATIVE
Unit division: 0

## 2015-07-26 LAB — GLUCOSE, CAPILLARY
GLUCOSE-CAPILLARY: 252 mg/dL — AB (ref 65–99)
GLUCOSE-CAPILLARY: 202 mg/dL — AB (ref 65–99)
GLUCOSE-CAPILLARY: 280 mg/dL — AB (ref 65–99)

## 2015-07-26 MED ORDER — CARVEDILOL 3.125 MG PO TABS: 3.1250 mg | ORAL_TABLET | Freq: Two times a day (BID) | ORAL | Status: DC

## 2015-07-26 MED ORDER — POLYETHYLENE GLYCOL 3350 17 G PO PACK
17.0000 g | PACK | Freq: Every day | ORAL | Status: DC | PRN
  Administered 2015-07-26: 17 g via ORAL
  Filled 2015-07-26: qty 1

## 2015-07-26 MED ORDER — SENNOSIDES-DOCUSATE SODIUM 8.6-50 MG PO TABS: 2.0000 | ORAL_TABLET | Freq: Every day | ORAL | Status: DC | PRN

## 2015-07-26 MED ORDER — MAGNESIUM HYDROXIDE 400 MG/5ML PO SUSP: 15.0000 mL | Freq: Every day | ORAL | Status: DC | PRN

## 2015-07-26 MED ORDER — SENNOSIDES-DOCUSATE SODIUM 8.6-50 MG PO TABS
2.0000 | ORAL_TABLET | Freq: Every day | ORAL | Status: DC | PRN
  Administered 2015-07-26: 2 via ORAL
  Filled 2015-07-26: qty 2

## 2015-07-26 MED ORDER — FERROUS SULFATE 325 (65 FE) MG PO TABS: 325.0000 mg | ORAL_TABLET | Freq: Every day | ORAL | Status: DC

## 2015-07-26 NOTE — Progress Notes (Signed)
  Echocardiogram 2D Echocardiogram has been performed.  Martin Leblanc 07/26/2015, 11:25 AM

## 2015-07-26 NOTE — Discharge Summary (Signed)
Name: Martin Leblanc MRN: [id number] DOB: September 19, 1929 82 y.o. PCP: No primary care provider on file.  Date of Admission: 07/24/2015  7:17 AM Date of Discharge: 07/26/15 Attending Physician: Dr. Eppie Gibson  Discharge Diagnosis: Principal Problem Symptomatic Anemia Active Problems Iron Deficiency Anemia HTN Type 2 DM   Discharge Medications:   Medication List    STOP taking these medications        losartan 100 MG tablet  Commonly known as:  COZAAR      TAKE these medications        amLODipine-benazepril 10-20 MG capsule  Commonly known as:  LOTREL  Take 1 capsule by mouth daily.     antiseptic oral rinse Liqd  15 mLs by Mouth Rinse route as needed for dry mouth.     ARTIFICIAL TEARS OP  Place 1 drop into both eyes daily as needed (dry eyes).     atorvastatin 20 MG tablet  Commonly known as:  LIPITOR  Take 20 mg by mouth daily.     carvedilol 3.125 MG tablet  Commonly known as:  COREG  Take 1 tablet (3.125 mg total) by mouth 2 (two) times daily with a meal.     doxazosin 4 MG tablet  Commonly known as:  CARDURA  Take 4 mg by mouth daily.     ferrous sulfate 325 (65 FE) MG tablet  Take 1 tablet (325 mg total) by mouth daily with breakfast.     HUMALOG MIX 50/50 KWIKPEN (50-50) 100 UNIT/ML Kwikpen  Generic drug:  Insulin Lispro Prot & Lispro  Inject 40-50 Units into the skin See admin instructions. Pt uses 40 units at breakfast and dinner and if BS is high, uses 50 units at bedtime     omeprazole 20 MG capsule  Commonly known as:  PRILOSEC  Take 20 mg by mouth daily.     PROSTATE PO  Take 1 capsule by mouth daily.     senna-docusate 8.6-50 MG tablet  Commonly known as:  Senokot-S  Take 2 tablets by mouth daily as needed for mild constipation.     solifenacin 10 MG tablet  Commonly known as:  VESICARE  Take 10 mg by mouth daily.     SYMBICORT 160-4.5 MCG/ACT inhaler  Generic drug:  budesonide-formoterol  Inhale 1-2 puffs into the lungs 2 (two) times daily.         Disposition and follow-up:   Mr.Martin Leblanc was [facility] in Good condition.  At the hospital follow up visit please address:  1.  Iron Deficiency Anemia: Is patient taking oral iron? Needs outpatient colonoscopy.  HTN: Discharged on combination Amlodipine-Benazepril. Losartan stopped as on both ACE and ARB. Will need BP recheck. DM: A1c 8.0. Needs close diabetes follow up.  2.  Labs / imaging needed at time of follow-up: CBC  3.  Pending labs/ test needing follow-up: None   Follow-up Appointments: Follow-up Information    Follow up with [facility].   Contact information:   [place] [id number] [address] Hospital Course by problem list:   Symptomatic Anemia: Patient presented with a 1.5 year history of progressively worsening dyspnea on exertion. Initially there was concern for ACS given his age and also complaint of "chest fullness." His EKG did not show any ischemic changes and troponins were negative x 3. An echocardiogram was checked to rule out heart failure as he had some peripheral edema and  crackles at his lung bases which showed a normal EF 60-65% and grade 2 diastolic dysfunction. After cardiac causes were deemed less likely his dyspnea was attributed to his anemia. His hemoglobin was noted to be 8.0 on admission with no known baseline. His Hgb dropped to 7.4 the following day without any evidence of bleeding. FOBT was negative. He was transfused 1 unit PRBCs and reported his breathing had improved. He will need an outpatient colonoscopy to make sure he does not have a GI malignancy.   HTN: BPs remained stable during hospitalization. His ACE was stopped as he was noted to be on both an ACE and ARB. Upon discharge, noted that he would do better on combination pill so Losartan was discontinued and he was discharged on Amlodipine-Benazepril combination pill.   Type 2 DM: A1c noted to be 8.0.  He was on Lantus and sliding scale insulin during his hospitalization. He was discharged on his home regimen of Humalog 50/50 40-50 units twice daily with meals. Will need follow up with his PCP to get his blood sugars under better control.  Discharge Vitals:   BP 131/71 mmHg  Pulse 93  Temp(Src) 97.8 F (36.6 C) (Oral)  Resp 18  Ht 6' (1.829 m)  Wt 232 lb (105.235 kg)  BMI 31.46 kg/m2  SpO2 95% Physical Exam General: elderly man sitting up in bed, NAD HEENT: [place]/AT, EOMI, sclera anicteric, mucus membranes moist CV: RRR, no m/g/r Pulm: Crackles at right lung base, breaths non-labored Abd: BS+, soft, obese, non-tender Ext: 1-2+ pitting edema in lower extremities Neuro: alert and oriented x 3  Pertinent Labs, Studies, and Procedures:  Hgb 8.0> 7.6> 8.1> 8.7 (post transfusion) Ferritin 5 HbA1c 8.0 D-dimer 2.16 BNP 293 Troponins neg x 3  CXR 07/24/15: Increased masslike thickening along right chest wall.  CT Angio Chest 07/24/15: Neg for PE. Right lower scarring, atelectasis, and small effusion. 3 cm rounded low-attenuation lesion in the right lower lobe measures water attenuation and may be loculated pleural fluid. A necrotic nodule is also possible but less likely. Emphysematous changes. Borderline mediastinal and hilar lymph nodes. Recommended follow up in 3 months with chest CT to follow up. Echo 07/26/15:  Study Conclusions - Left ventricle: The cavity size was normal. There was mild  concentric hypertrophy. Systolic function was normal. The  estimated ejection fraction was in the range of 60% to 65%. Wall  motion was normal; there were no regional wall motion  abnormalities. Features are consistent with a pseudonormal left  ventricular filling pattern, with concomitant abnormal relaxation  and increased filling pressure (grade 2 diastolic dysfunction).  Doppler parameters are consistent with high ventricular filling  pressure. - Aortic valve: Severe diffuse thickening and  calcification. Valve  mobility was restricted. There was moderate to severe stenosis.  There was mild regurgitation. Valve area (VTI): 0.95 cm^2. Valve  area (Vmax): 0.95 cm^2. Valve area (Vmean): 0.88 cm^2. - Mitral valve: Calcified annulus. Mildly thickened, mildly  calcified leaflets . - Left atrium: The atrium was mildly dilated.  Discharge Instructions: Discharge Instructions    Diet - low sodium heart healthy    Complete by:  As directed      Discharge instructions    Complete by:  As directed   It was a pleasure taking care of you, Mr. Metcalf. You were hospitalized for shortness of breath which was likely due to your anemia. Your echocardiogram showed that you do have some mild diastolic heart failure as well. It will be important for  you to start taking your new medication Coreg (Carvedilol) 3.125 mg twice daily to help your heart. Also start taking Iron 325 mg daily for your anemia. You will need another colonoscopy as an outpatient. Please follow up with Dr. Forde Dandy in the next 1-2 weeks.   Take care, Dr. Arcelia Jew     Increase activity slowly    Complete by:  As directed            Signed: Juliet Rude, MD 07/28/2015, 11:43 AM   Pager: [phone]

## 2015-07-26 NOTE — Care Management Obs Status (Signed)
[facility] NOTIFICATION   Patient Details  Name: Martin Leblanc MRN: [id number] Date of Birth: [birth year]   Medicare Observation Status Notification Given:  Yes    Royston Bake, RN 07/26/2015, 2:24 PM

## 2015-07-26 NOTE — Progress Notes (Signed)
Internal Medicine Attending  Date: 07/26/2015  Patient name: Martin Leblanc Medical record number: [id number] Date of birth: 08-12-1929 Age: 82 y.o. Gender: male  I saw and evaluated the patient. I reviewed the resident's note by Dr. Arcelia Jew and I agree with the resident's findings and plans as documented in her progress note.  When seen on rounds this morning [name] had just returned from a long walk down the hall. He states that his breathing is improved although not back to baseline. We also discussed other possible causes of his dyspnea and clarified his goals. He was hopeful to be symptomatically improved without the need for any invasive intervention. He felt his dyspnea had improved enough that he's stable to go home. We will follow-up on the results of the echocardiogram to be done today and consider empiric therapy for coronary artery disease with the addition of the beta blocker should it be necessary. He will need outpatient follow-up to include a colonoscopy given his iron deficiency anemia without obvious explanation at this point. I agree with the plan to discharge home today with outpatient follow-up with his primary care provider Dr. Forde Dandy.

## 2015-07-26 NOTE — Progress Notes (Signed)
Pt. Requesting milk of mag to help him have a bowel movement. On call for [facility] paged.

## 2015-07-26 NOTE — Progress Notes (Addendum)
   Subjective: No acute events overnight. Patient complaining about constipation. States he took Miralax this morning but nothing has happened yet. Reports he is unsure if his breathing is any better compared to yesterday after the blood transfusion.   Objective: Vital signs in last 24 hours: Filed Vitals:   07/25/15 1539 07/25/15 1825 07/25/15 2054 07/26/15 0611  BP: 117/60 121/59 115/59 124/66  Pulse: 89 87 91 90  Temp: 98.1 F (36.7 C) 97.8 F (36.6 C) 98.7 F (37.1 C) 98.7 F (37.1 C)  TempSrc: Oral Oral Oral Oral  Resp: 18 18 18 18   Height:      Weight:    232 lb (105.235 kg)  SpO2: 92% 93% 93% 93%   Physical Exam General: elderly man sitting up in bed, NAD HEENT: [place]/AT, EOMI, sclera anicteric, mucus membranes moist CV: RRR, no m/g/r Pulm: Crackles at right lung base, breaths non-labored Abd: BS+, soft, obese, non-tender Ext: 1-2+ pitting edema in lower extremities Neuro: alert and oriented x 3  Assessment/Plan:  Symptomatic Anemia: Patient unsure if breathing has improved after 1 unit PRBC transfusion yesterday.  I am concerned his dyspnea could represent anginal equivalent. He is not interested in pursuing aggressive treatment (i.e. Cardiac cath), but is amenable to medical treatment. Will wait for his echo to come back and if normal then will not add any new medical treatments, but will let his PCP know to consider CAD if his symptoms reoccur. If echo comes back abnormal, will add on beta blocker and ASA to his medication regimen.  - f/u echo  Iron Deficiency Anemia: Hgb improved to 8.7 this morning after 1 unit PRBC transfusion yesterday.  He will need repeat colonoscopy to ensure he is not having bleeding from a colon cancer. - Start oral iron therapy upon discharge  - Colonoscopy as outpatient   HTN: BPs stable in 120s.  - Continue home Amlodipine and Losartan  Type 2 DM: His A1c is 8.0. Blood sugar 202 this morning.  - Continue Lantus to 12 units QHS -  Continue sensitive ISS  Diet: Heart healthy DVT PPx: Lovenox SQ Dispo: Discharge possibly today or tomorrow     Juliet Rude, MD 07/26/2015, 7:34 AM Pager: [phone]

## 2015-07-26 NOTE — Progress Notes (Signed)
Orders received for pt discharge.  Discharge summary printed and reviewed with pt.  Explained medication regimen, and pt had no further questions at this time.  IV removed and site remains clean, dry, intact.  Telemetry removed.  Pt in stable condition and awaiting transport. 

## 2015-07-31 NOTE — Anesthesia Preprocedure Evaluation (Addendum)
Anesthesia Evaluation  Patient identified by MRN, date of birth, ID band Patient awake    Reviewed: Allergy & Precautions, H&P , Patient's Chart, lab work & pertinent test results, reviewed documented beta blocker date and time   Airway Mallampati: II  TM Distance: >3 FB Neck ROM: full    Dental no notable dental hx.    Pulmonary former smoker,    Pulmonary exam normal breath sounds clear to auscultation       Cardiovascular hypertension,  Rhythm:regular Rate:Normal     Neuro/Psych    GI/Hepatic   Endo/Other  diabetes  Renal/GU      Musculoskeletal   Abdominal   Peds  Hematology   Anesthesia Other Findings   Reproductive/Obstetrics                            Anesthesia Physical Anesthesia Plan  ASA: III  Anesthesia Plan:    Post-op Pain Management:    Induction: Intravenous  Airway Management Planned: LMA  Additional Equipment:   Intra-op Plan:   Post-operative Plan:   Informed Consent: I have reviewed the patients History and Physical, chart, labs and discussed the procedure including the risks, benefits and alternatives for the proposed anesthesia with the patient or authorized representative who has indicated his/her understanding and acceptance.   Dental Advisory Given and Dental advisory given  Plan Discussed with: CRNA and Surgeon  Anesthesia Plan Comments: (Discussed GA with LMA, possible sore throat, potential need to switch to ETT, N/V, pulmonary aspiration. Questions answered. )        Anesthesia Quick Evaluation

## 2015-08-03 ENCOUNTER — Encounter (HOSPITAL_COMMUNITY): Admission: RE | Admit: 2015-08-03 | Payer: Medicare Other | Source: Ambulatory Visit

## 2015-09-04 ENCOUNTER — Ambulatory Visit: Payer: Medicare Other | Admitting: Cardiology

## 2015-09-07 ENCOUNTER — Encounter (HOSPITAL_COMMUNITY)
Admission: RE | Admit: 2015-09-07 | Discharge: 2015-09-07 | Disposition: A | Discharge status: Home / Self Care | Payer: Medicare Other | Source: Ambulatory Visit | Attending: Urology | Admitting: Urology

## 2015-09-07 ENCOUNTER — Encounter (HOSPITAL_COMMUNITY): Payer: Self-pay

## 2015-09-07 DIAGNOSIS — Z01812 Encounter for preprocedural laboratory examination: Secondary | ICD-10-CM | POA: Diagnosis not present

## 2015-09-07 HISTORY — DX: Localized edema: R60.0

## 2015-09-07 HISTORY — DX: Unspecified visual loss: H54.7

## 2015-09-07 HISTORY — DX: Gastro-esophageal reflux disease without esophagitis: K21.9

## 2015-09-07 HISTORY — DX: Reserved for inherently not codable concepts without codable children: IMO0001

## 2015-09-07 LAB — BASIC METABOLIC PANEL
Anion gap: 6 (ref 5–15)
BUN: 20 mg/dL (ref 6–20)
CHLORIDE: 108 mmol/L (ref 101–111)
CO2: 25 mmol/L (ref 22–32)
CREATININE: 0.95 mg/dL (ref 0.61–1.24)
Calcium: 9.9 mg/dL (ref 8.9–10.3)
GFR calc Af Amer: >60 mL/min (ref >60)
GFR calc non Af Amer: >60 mL/min (ref >60)
GLUCOSE: 109 mg/dL — AB (ref 65–99)
Potassium: 4.9 mmol/L (ref 3.5–5.1)
SODIUM: 139 mmol/L (ref 135–145)

## 2015-09-07 LAB — CBC
HCT: 34.5 % — ABNORMAL LOW (ref 39.0–52.0)
Hemoglobin: 10.5 g/dL — ABNORMAL LOW (ref 13.0–17.0)
MCH: 24.2 pg — AB (ref 26.0–34.0)
MCHC: 30.4 g/dL (ref 30.0–36.0)
MCV: 79.7 fL (ref 78.0–100.0)
PLATELETS: 338 10*3/uL (ref 150–400)
RBC: 4.33 MIL/uL (ref 4.22–5.81)
RDW: 21.7 % — AB (ref 11.5–15.5)
WBC: 9.3 10*3/uL (ref 4.0–10.5)

## 2015-09-07 NOTE — Patient Instructions (Addendum)
Martin Leblanc  09/07/2015   Your procedure is scheduled on: 09-18-15   Report to [facility]  Entrance take [facility]  elevators to 3rd floor to  [place] at   1:30 PM.  Call this number if you have problems the morning of surgery [phone]   Remember: ONLY 1 PERSON MAY GO WITH YOU TO SHORT STAY TO GET  READY MORNING OF [place].  Do not eat food or drink liquids :After Midnight.,Exception, may have Clear Liquids 12 midnight to 0930 AM, then nothing.   CLEAR LIQUID DIET   Foods Allowed                                                                     Foods Excluded  Coffee and tea, regular and decaf                             liquids that you cannot  Plain Jell-O in any flavor                                             see through such as: Fruit ices (not with fruit pulp)                                     milk, soups, orange juice  Iced Popsicles                                    All solid food Carbonated beverages, regular and diet                                    Cranberry, grape and apple juices Sports drinks like Gatorade Lightly seasoned clear broth or consume(fat free) Sugar, honey syrup  _____________________________________________________________________       Take these medicines the morning of surgery with A SIP OF WATER: Amlodipine. Atorvastatin. Carvedilol. Doxazosin. Omeprazole. Vesicare. Inhalers-usual. Night before- Insulin(1/2 usual PM dose)-none AM of. DO NOT TAKE ANY DIABETIC MEDICATIONS DAY OF YOUR SURGERY                               You may not have any metal on your body including hair pins and              piercings  Do not wear jewelry, make-up, lotions, powders or perfumes, deodorant             Do not wear nail polish.  Do not shave  48 hours prior to surgery.              Men may shave face and neck.   Do not bring valuables to the hospital. [facility] IS NOT  RESPONSIBLE   FOR  VALUABLES.  Contacts, dentures or bridgework may not be worn into surgery.  Leave suitcase in the car. After surgery it may be brought to your room.     Patients discharged the day of surgery will not be allowed to drive home.  Name and phone number of your driver: Martin Leblanc in law [phone] or [name]  Special Instructions: N/A              Please read over the following fact sheets you were given: _____________________________________________________________________             [facility] - Preparing for Surgery Before surgery, you can play an important role.  Because skin is not sterile, your skin needs to be as free of germs as possible.  You can reduce the number of germs on your skin by washing with CHG (chlorahexidine gluconate) soap before surgery.  CHG is an antiseptic cleaner which kills germs and bonds with the skin to continue killing germs even after washing. Please DO NOT use if you have an allergy to CHG or antibacterial soaps.  If your skin becomes reddened/irritated stop using the CHG and inform your nurse when you arrive at Short Stay. Do not shave (including legs and underarms) for at least 48 hours prior to the first CHG shower.  You may shave your face/neck. Please follow these instructions carefully:  1.  Shower with CHG Soap the night before surgery and the  morning of Surgery.  2.  If you choose to wash your hair, wash your hair first as usual with your  normal  shampoo.  3.  After you shampoo, rinse your hair and body thoroughly to remove the  shampoo.                           4.  Use CHG as you would any other liquid soap.  You can apply chg directly  to the skin and wash                       Gently with a scrungie or clean washcloth.  5.  Apply the CHG Soap to your body ONLY FROM THE NECK DOWN.   Do not use on face/ open                           Wound or open sores. Avoid contact with eyes, ears mouth and genitals (private parts).                        Wash face,  Genitals (private parts) with your normal soap.             6.  Wash thoroughly, paying special attention to the area where your surgery  will be performed.  7.  Thoroughly rinse your body with warm water from the neck down.  8.  DO NOT shower/wash with your normal soap after using and rinsing off  the CHG Soap.                9.  Pat yourself dry with a clean towel.            10.  Wear clean pajamas.            11.  Place clean sheets on your bed the night of your first shower and do not  sleep with pets. Day of Surgery : Do not apply any lotions/deodorants the morning of surgery.  Please wear clean clothes to the hospital/surgery center.  FAILURE TO FOLLOW THESE INSTRUCTIONS MAY RESULT IN THE CANCELLATION OF YOUR SURGERY PATIENT SIGNATURE_________________________________  NURSE SIGNATURE__________________________________  ________________________________________________________________________

## 2015-09-07 NOTE — Pre-Procedure Instructions (Addendum)
EKG/Echo/ CXR 07-24-15 Epic. Hgb A1C =8.0 (07-24-15 Epic)

## 2015-09-11 ENCOUNTER — Encounter (INDEPENDENT_AMBULATORY_CARE_PROVIDER_SITE_OTHER): Payer: Self-pay

## 2015-09-11 ENCOUNTER — Ambulatory Visit (INDEPENDENT_AMBULATORY_CARE_PROVIDER_SITE_OTHER): Payer: Medicare Other | Admitting: Cardiology

## 2015-09-11 ENCOUNTER — Encounter: Payer: Self-pay | Admitting: Cardiology

## 2015-09-11 VITALS — BP 116/60 | HR 86 | Ht 72.0 in | Wt 228.8 lb

## 2015-09-11 DIAGNOSIS — D509 Iron deficiency anemia, unspecified: Secondary | ICD-10-CM

## 2015-09-11 DIAGNOSIS — I2510 Atherosclerotic heart disease of native coronary artery without angina pectoris: Secondary | ICD-10-CM

## 2015-09-11 DIAGNOSIS — I350 Nonrheumatic aortic (valve) stenosis: Secondary | ICD-10-CM | POA: Diagnosis not present

## 2015-09-11 DIAGNOSIS — I440 Atrioventricular block, first degree: Secondary | ICD-10-CM

## 2015-09-11 DIAGNOSIS — I700 Atherosclerosis of aorta: Secondary | ICD-10-CM

## 2015-09-11 DIAGNOSIS — I10 Essential (primary) hypertension: Secondary | ICD-10-CM

## 2015-09-11 DIAGNOSIS — I2584 Coronary atherosclerosis due to calcified coronary lesion: Secondary | ICD-10-CM

## 2015-09-11 MED ORDER — AMLODIPINE BESY-BENAZEPRIL HCL 5-10 MG PO CAPS: 1.0000 | ORAL_CAPSULE | Freq: Every day | ORAL | 11 refills | Status: DC

## 2015-09-11 NOTE — Progress Notes (Signed)
Cardiology Office Note    Date:  09/11/2015   ID:  Martin Leblanc, DOB 07-23-1929, MRN [id number]  PCP:  Sheela Stack, MD  Cardiologist:   Candee Furbish, MD   No chief complaint on file.   History of Present Illness:  Martin Leblanc is a 82 y.o. male here for evaluation shortness of breath, diastolic dysfunction. Has a past history of diabetic nephropathy, hypertension, peripheral vascular disease, hyperlipidemia. His father died at age 97 with CAD. He has a history of COPD with extensive pleural and parenchymal scarring in the right lower lobe suggestive related to prior surgery. Underlying emphysematous changes.  He also has aortic stenosis which is moderate to severe with mild regurgitation.  - Left ventricle: The cavity size was normal. There was mild   concentric hypertrophy. Systolic function was normal. The   estimated ejection fraction was in the range of 60% to 65%. Wall   motion was normal; there were no regional wall motion   abnormalities. Features are consistent with a pseudonormal left   ventricular filling pattern, with concomitant abnormal relaxation   and increased filling pressure (grade 2 diastolic dysfunction).   Doppler parameters are consistent with high ventricular filling   pressure. - Aortic valve: Severe diffuse thickening and calcification. Valve   mobility was restricted. There was moderate to severe stenosis.   There was mild regurgitation. Valve area (VTI): 0.95 cm^2. Valve   area (Vmax): 0.95 cm^2. Valve area (Vmean): 0.88 cm^2. - Mitral valve: Calcified annulus. Mildly thickened, mildly   calcified leaflets . - Left atrium: The atrium was mildly dilated.   Past Medical History:  Diagnosis Date  . Diabetes mellitus without complication (HCC)    Insulin use  . Edema extremities    right greater than left feet.  Marland Kitchen GERD (gastroesophageal reflux disease)   . Hypertension   . Malignant melanoma ([place])    s/p treatment 25 yrs ago-left eye  .  Shortness of breath dyspnea    recent had hospital visit 7'17 [facility]- slightly improved.  . Sight impaired    left eye- hx melanoma/radiation    Past Surgical History:  Procedure Laterality Date  . EYE SURGERY Left    laser eye surgery, after radiation for melanoma tx  . INGUINAL HERNIA REPAIR     early 2000s    Current Medications: Outpatient Medications Prior to Visit  Medication Sig Dispense Refill  . antiseptic oral rinse (BIOTENE) LIQD 15 mLs by Mouth Rinse route as needed for dry mouth.    Marland Kitchen atorvastatin (LIPITOR) 20 MG tablet Take 20 mg by mouth daily.    Marland Kitchen doxazosin (CARDURA) 4 MG tablet Take 4 mg by mouth daily.    . ferrous sulfate 325 (65 FE) MG tablet Take 1 tablet (325 mg total) by mouth daily with breakfast. 30 tablet 3  . HUMALOG MIX 50/50 KWIKPEN (50-50) 100 UNIT/ML Kwikpen Inject 40-50 Units into the skin See admin instructions. Pt uses 40 units at breakfast and dinner and if BS is high, uses 50 units at bedtime    . Hypromellose (ARTIFICIAL TEARS OP) Place 1 drop into both eyes daily as needed (dry eyes).    Marland Kitchen omeprazole (PRILOSEC) 20 MG capsule Take 20 mg by mouth daily.    Marland Kitchen senna-docusate (SENOKOT-S) 8.6-50 MG tablet Take 2 tablets by mouth daily as needed for mild constipation. 30 tablet 0  . solifenacin (VESICARE) 10 MG tablet Take 10 mg by mouth daily.    Marland Kitchen Specialty Vitamins Products (  PROSTATE PO) Take 1 capsule by mouth daily.    . SYMBICORT 160-4.5 MCG/ACT inhaler Inhale 1-2 puffs into the lungs 2 (two) times daily as needed (shortness of breath).   4  . amLODipine-benazepril (LOTREL) 10-20 MG capsule Take 1 capsule by mouth daily.    . carvedilol (COREG) 3.125 MG tablet Take 1 tablet (3.125 mg total) by mouth 2 (two) times daily with a meal. 60 tablet 1   No facility-administered medications prior to visit.      Allergies:   Review of patient's allergies indicates no known allergies.   Social History   Social History  . Marital status: Married     Spouse name: N/A  . Number of children: 4  . Years of education: N/A   Social History Main Topics  . Smoking status: Former Smoker    Packs/day: 0.50    Years: 20.00    Types: Cigarettes    Quit date: 09/06/1973  . Smokeless tobacco: Never Used     Comment: quit 45 years ago  . Alcohol use No  . Drug use: No  . Sexual activity: Not Currently   Other Topics Concern  . 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   ROS:   Please see the history of present illness.    ROS All other systems reviewed and are negative.   PHYSICAL EXAM:   VS:  BP 116/60   Pulse 86   Ht 6' (1.829 m)   Wt 228 lb 12.8 oz (103.8 kg)   BMI 31.03 kg/m    GEN: Well nourished, well developed, in no acute distress  HEENT: normal  Neck: no JVD, radiation of AV murmur to carotids, or masses Cardiac: RRR; 3/6 S murmur RUSB,no rubs, or gallops,no edema  Respiratory:  clear to auscultation bilaterally, normal work of breathing GI: soft, nontender, nondistended, + BS, protuberant abdomen MS: no deformity or atrophy  Skin: warm and dry, no rash Neuro:  Alert and Oriented x 3, Strength and sensation are intact Psych: euthymic mood, full affect  Wt Readings from Last 3 Encounters:  09/11/15 228 lb 12.8 oz (103.8 kg)  09/07/15 230 lb (104.3 kg)  07/26/15 232 lb (105.2 kg)      Studies/Labs Reviewed:   EKG:  EKG is ordered today. 09/11/15-sinus rhythm, first-degree AV block, 276 ms, PVC noted, no specific ST segment changes. 07/25/15-sinus rhythm with first degree AV block of 278 ms, no other significant abnormalities. Personally viewed  CT scan of chest from July 2017 personally reviewed-LAD coronary calcification noted. Significant calcified aortic valve. Aortic atherosclerosis  noted as well  Recent Labs: 07/24/2015: ALT 18; B Natriuretic Peptide 293.0 09/07/2015: BUN 20; Creatinine, Ser 0.95; Hemoglobin 10.5; Platelets 338; Potassium 4.9; Sodium 139   Lipid Panel    Component Value Date/Time   CHOL 150 07/24/2015 1553   TRIG 68 07/24/2015 1553   HDL 43 07/24/2015 1553   CHOLHDL 3.5 07/24/2015 1553   VLDL 14 07/24/2015 1553   LDLCALC 93 07/24/2015 1553    Additional studies/ records that were reviewed today include:  As above, office notes, EKG, echo, CT scan, labs    ASSESSMENT:    1. Essential hypertension   2. Aortic stenosis   3. First degree AV block  4. Aortic atherosclerosis ([place])   5. Coronary artery calcification   6. Iron deficiency anemia      PLAN:  In order of problems listed above:  Preoperative cardiovascular evaluation  - He has moderate aortic stenosis on echocardiogram based upon velocities and gradient. Personally viewed echocardiogram on degree with moderate range. Normal ejection fraction. Certainly has shortness of breath with activity which is multifactorial from deconditioning, emphysematous changes on CT, perhaps a contribution as well from aortic stenosis, obesity.  - Based upon our interview today, candid discussion about potential risks with surgery/anesthesia, he is still willing to proceed with bladder biopsy under general anesthesia and wishes to have this procedure done. I do believe that he may proceed from a cardiovascular standpoint given his moderate overall aortic stenosis with excellent ejection fraction. I did discuss with him and his daughter, nurse at surgery center, that she is of at least moderate cardiovascular risk. If he had exhibited severe aortic stenosis on echocardiogram, this would place him at high risk for surgery. Based upon his echocardiogram and physical exam findings, he is not quite at the severe range yet.  - To optimize him prior to his surgery, I will decrease his antihypertensives. I will  decrease his amlodipine/benazepril from 10/20 to 5/10mg . His blood pressure today was in the 110 range. I will also discontinue his newly started carvedilol 3.125 mg twice a day because of his first-degree AV block of 278 ms. I do not want to have a worsening conduction disorder exhibited.  Aortic stenosis moderate  - Personally reviewed echo as above. We will follow. I will be repeating echocardiogram likely next year or sooner if symptoms worsen.  - We discussed potential TAVR in the future as a possibility.  Emphysematous changes on CT scan  - He is seeing pulmonary medicine tomorrow.  - Former smoker, quit 40 years ago.  Diabetes type 2  - Hemoglobin A1c 8. Working with Dr. Forde Dandy.  First-degree AV block  - As above. Stopping Coreg.  Prior anemia, iron deficiency-ferritin 5  - Required 1 unit transfusion.  - May need colonoscopy in the future.  Coronary artery calcification/aortic atherosclerosis  - Continue prevention efforts.  - Continue statin.  We will continue to follow.  If assistance is needed, please let us know.     Medication Adjustments/Labs and Tests Ordered: Current medicines are reviewed at length with the patient today.  Concerns regarding medicines are outlined above.  Medication changes, Labs and Tests ordered today are listed in the Patient Instructions below. Patient Instructions  Medication Instructions:  Please decrease Amlodipine-benezapril to 5-10 mg a day. Please stop Carvedilol. Continue all other medications as listed.  Follow-Up: Follow up in 6 months with Dr. Marlou Porch.  You will receive a letter in the mail 2 months before you are due.  Please call us when you receive this letter to schedule your follow up appointment.  If you need a refill on your cardiac medications before your next appointment, please call your pharmacy.  Thank you for choosing [facility]!!        Signed, Candee Furbish, MD  09/11/2015 4:33 PM    [address], [place], [place]  [address] Phone: [phone]; Fax: [phone]

## 2015-09-11 NOTE — Patient Instructions (Addendum)
Medication Instructions:  Please decrease Amlodipine-benezapril to 5-10 mg a day. Please stop Carvedilol. Continue all other medications as listed.  Follow-Up: Follow up in 6 months with Dr. Marlou Porch.  You will receive a letter in the mail 2 months before you are due.  Please call us when you receive this letter to schedule your follow up appointment.  If you need a refill on your cardiac medications before your next appointment, please call your pharmacy.  Thank you for choosing [facility]!!

## 2015-09-12 ENCOUNTER — Ambulatory Visit (INDEPENDENT_AMBULATORY_CARE_PROVIDER_SITE_OTHER): Payer: Medicare Other | Admitting: Emergency Medicine

## 2015-09-12 ENCOUNTER — Encounter: Payer: Self-pay | Admitting: Emergency Medicine

## 2015-09-12 VITALS — BP 118/62 | HR 87 | Ht 72.0 in | Wt 229.0 lb

## 2015-09-12 DIAGNOSIS — Z87891 Personal history of nicotine dependence: Secondary | ICD-10-CM | POA: Diagnosis not present

## 2015-09-12 DIAGNOSIS — R938 Abnormal findings on diagnostic imaging of other specified body structures: Secondary | ICD-10-CM

## 2015-09-12 DIAGNOSIS — R9389 Abnormal findings on diagnostic imaging of other specified body structures: Secondary | ICD-10-CM | POA: Insufficient documentation

## 2015-09-12 DIAGNOSIS — R0600 Dyspnea, unspecified: Secondary | ICD-10-CM

## 2015-09-12 DIAGNOSIS — R911 Solitary pulmonary nodule: Secondary | ICD-10-CM

## 2015-09-12 DIAGNOSIS — R0602 Shortness of breath: Secondary | ICD-10-CM

## 2015-09-12 NOTE — Assessment & Plan Note (Signed)
RLL pleural disease and atelectatic change likely following remote VATS. The rounded area at the R base appears to be fluid density, suspect it is trapped effusion surrounded by scar. Could consider PET to further eval but I think best plan will be repeat Ct chest to look for interval stability. Will arrange for this

## 2015-09-12 NOTE — Patient Instructions (Addendum)
We will perform pulmonary function testing for evaluation of your breathing prior to general anesthesia. Please stop Symbicort for now Please use albuterol 2 puffs if needed for shortness of breath instead of the Symbicort Walking oximetry today We will repeat your CT scan of the chest in November 2017 To assess for interval change Based on our evaluation today you are at a moderately increased risk for general anesthesia but this does not preclude having your bladder procedure done. Follow with Dr Lamonte Sakai in November after your Ct scan to review the results.

## 2015-09-12 NOTE — Progress Notes (Signed)
Subjective:  Patient ID: Martin Leblanc, male    DOB: 1930-01-01, 82 y.o.   MRN: [id number]  HPI 82 year old former smoker with diabetes, hypertension, peripheral vascular disease and moderate aortic stenosis. Also carries a history of COPD although no Pulmonary function testing available at this time. Has Anemia. Also carries a history of malignant melanoma resected 25 years ago. He underwent R VATS surgical resection of benign hamartoma w Dr Arlyce Dice in 2001. Finally he is also currently under evaluation for a bladder lesion by Dr Alinda Money, planning for biopsy on 8/28. Dr Marlou Porch has evaluated him and deems him moderate cardiac risk for surgery. He is referred to me for pulm risk stratification and also for evaluation of abnormal CT scan chest. He has symbicort that he uses prn - about 2x a week. He has SOB after about 30 feet. He has dry mouth, cough that is non-productive. CT scan was reviewed by me, shows a rounded fluid density lesion and pleural basilar scarring.    Review of Systems  Constitutional: Negative for fever and unexpected weight change.  HENT: Negative for congestion, dental problem, ear pain, nosebleeds, postnasal drip, rhinorrhea, sinus pressure, sneezing, sore throat and trouble swallowing.   Eyes:  Negative for redness and itching.  Respiratory: Positive for cough and shortness of breath. Negative for chest tightness and wheezing.   Cardiovascular: Negative for palpitations and leg swelling.  Gastrointestinal: Negative for nausea and vomiting.  Genitourinary: Negative for dysuria.  Musculoskeletal: Negative for joint swelling.  Skin: Negative for rash.  Neurological: Negative for headaches.  Hematological: Does not bruise/bleed easily.  Psychiatric/Behavioral: Negative for dysphoric mood. The patient is not nervous/anxious.    Past Medical History:  Diagnosis Date  . Diabetes mellitus without complication (HCC)    Insulin use  . Edema extremities    right greater than left feet.  Marland Kitchen GERD (gastroesophageal reflux disease)   . Hypertension   . Malignant melanoma ([place])    s/p treatment 25 yrs ago-left eye  . Shortness of breath dyspnea    recent had hospital visit 7'17 [facility]- slightly improved.  . Sight impaired    left eye- hx melanoma/radiation     Family History  Problem Relation Age of Onset  . Diabetes Maternal Grandmother   . Emphysema Father      Social History   Social History  . Marital status: Married    Spouse name: N/A  . Number of children: 4  . Years of education: N/A   Occupational History  . Not on file.   Social History Main Topics  . Smoking status: Former Smoker    Packs/day: 0.50    Years: 20.00    Types: Cigarettes    Quit date: 09/06/1973  . Smokeless tobacco: Never Used     Comment: quit 35 years ago  . Alcohol use No  . Drug use: No  . Sexual activity: Not Currently   Other Topics Concern  . 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 amLODipine-benazepril (LOTREL) 5-10 MG capsule Take 1 capsule by mouth daily. 30  capsule 11  . antiseptic oral rinse (BIOTENE) LIQD 15 mLs by Mouth Rinse route as needed for dry mouth.    Marland Kitchen atorvastatin (LIPITOR) 20 MG tablet Take 20 mg by  mouth daily.    Marland Kitchen doxazosin (CARDURA) 4 MG tablet Take 4 mg by mouth daily.    . ferrous sulfate 325 (65 FE) MG tablet Take 1 tablet (325 mg total) by mouth daily with breakfast. 30 tablet 3  . HUMALOG MIX 50/50 KWIKPEN (50-50) 100 UNIT/ML Kwikpen Inject 40-50 Units into the skin See admin instructions. Pt uses 40 units at breakfast and dinner and if BS is high, uses 50 units at bedtime    . Hypromellose (ARTIFICIAL TEARS OP) Place 1 drop into both eyes daily as needed (dry eyes).    Marland Kitchen omeprazole (PRILOSEC) 20 MG capsule Take 20 mg by mouth daily.    Marland Kitchen senna-docusate (SENOKOT-S) 8.6-50 MG tablet Take 2 tablets by mouth daily as needed for mild constipation. 30 tablet 0  . solifenacin (VESICARE) 10 MG tablet Take 10 mg by mouth daily.    Marland Kitchen Specialty Vitamins Products (PROSTATE PO) Take 1 capsule by mouth daily.    . SYMBICORT 160-4.5 MCG/ACT inhaler Inhale 1-2 puffs into the lungs 2 (two) times daily as needed (shortness of breath).   4   No facility-administered medications prior to visit.         Objective:   Physical Exam Vitals:   09/12/15 0856 09/12/15 0857  BP:  118/62  Pulse:  87  SpO2:  93%  Weight: 229 lb (103.9 kg)   Height: 6' (1.829 m)    Gen: Pleasant, obese man, in no distress,  normal affect  ENT: No lesions,  mouth clear,  oropharynx clear, no postnasal drip  Neck: No JVD, no TMG, no carotid bruits  Lungs: No use of accessory muscles, bibasilar insp mild crackles, no wheeze  Cardiovascular: RRR, heart sounds normal, no murmur or gallops, no peripheral edema  Musculoskeletal: No deformities, no cyanosis or clubbing  Neuro: alert, non focal  Skin: Warm, no lesions or rashes      07/23/01 --  COMPARISON:  Multiple prior chest x-rays. Prior chest CT from 01/02/1999 is not  available.  FINDINGS: Cardiovascular: The heart is normal in size for age. No pericardial effusion. There is tortuosity, ectasia and dense atherosclerotic calcification involving the thoracic aorta. No dissection or focal aneurysm. Calcifications also noted at the aortic valve and mitral valve annulus.  Scattered coronary artery calcifications.  The pulmonary arterial tree is fairly well opacified. No filling defects to suggest pulmonary embolism.  Mediastinum/Nodes: Numerous small mediastinal and hilar lymph nodes. There is a larger 17 mm subcarinal node and borderline infrahilar lymph nodes bilaterally. 10 mm right hilar node on image 56. 9 mm infrahilar node on image number 51.  Lungs/Pleura: Extensive pleural and parenchymal scarring changes in the right lower lobe likely related to prior surgery. There could be superimposed atelectasis or infiltrate. There are also small bilateral pleural effusions.  Rounded right lower lobe pulmonary lesion measures water attenuation and may be a loculated pleural fluid collection. Could not totally exclude the possibility of a necrotic nodule. It measures 32 mm on image number 75.  Underline emphysematous changes and areas of pulmonary scarring.  Upper Abdomen: No significant upper abdominal findings.  Musculoskeletal: No significant bony findings. Probable postoperative scarring type changes involving the right chest wall musculature with fatty atrophy. This mainly involves the serratus anterior muscle.  Review of the MIP images confirms the above findings.  IMPRESSION: 1. No CT findings for pulmonary embolism. 2. Right lower scarring, atelectasis and small effusion. Based on prior chest x-rays most of this is likely chronic but could  not exclude superimposed atelectasis or infiltrate. 3. 3 cm rounded low-attenuation lesion in the right lower lobe measures water attenuation and may be loculated pleural fluid.  A necrotic nodule is also possible but less likely. 4. Emphysematous changes. 5. Borderline mediastinal and hilar lymph nodes. 6. Small to moderate-sized hiatal hernia. 7. Recommend short-term followup chest CT (3 months) to re-evaluate the lymph nodes and right lower lobe lesion     Assessment & Plan:  Exertional shortness of breath Suspect that the patient does have COPD superimposed on his other issues. He has been using symbicort prn, which I will stop for now. Order albuterol prn instead and plan for new maintenance meds as we go forward. Will arrange for PFT to quantify obstructive disease, as pre-=op eval for his bladder sgy.   Abnormal CT scan, chest RLL pleural disease and atelectatic change likely following remote VATS. The rounded area at the R base appears to be fluid density, suspect it is trapped effusion surrounded by scar. Could consider PET to further eval but I think best plan will be repeat Ct chest to look for interval stability. Will arrange for this  Baltazar Apo, MD, PhD 09/12/2015, 1:18 PM  Pulmonary and Critical Care [phone] or if no answer [phone]

## 2015-09-12 NOTE — Assessment & Plan Note (Signed)
Suspect that the patient does have COPD superimposed on his other issues. He has been using symbicort prn, which I will stop for now. Order albuterol prn instead and plan for new maintenance meds as we go forward. Will arrange for PFT to quantify obstructive disease, as pre-=op eval for his bladder sgy.

## 2015-09-13 ENCOUNTER — Other Ambulatory Visit: Payer: Self-pay | Admitting: *Deleted

## 2015-09-13 ENCOUNTER — Ambulatory Visit (INDEPENDENT_AMBULATORY_CARE_PROVIDER_SITE_OTHER): Payer: Medicare Other | Admitting: Emergency Medicine

## 2015-09-13 DIAGNOSIS — Z87891 Personal history of nicotine dependence: Secondary | ICD-10-CM

## 2015-09-13 DIAGNOSIS — R0600 Dyspnea, unspecified: Secondary | ICD-10-CM

## 2015-09-13 LAB — PULMONARY FUNCTION TEST
DL/VA % pred: 62 %
DL/VA: 2.91 ml/min/mmHg/L
DLCO UNC % PRED: 53 %
DLCO UNC: 18.08 ml/min/mmHg
DLCO cor % pred: 56 %
DLCO cor: 19.19 ml/min/mmHg
FEF 25-75 POST: 2.11 L/s
FEF 25-75 Pre: 1.75 L/sec
FEF2575-%CHANGE-POST: 20 %
FEF2575-%PRED-POST: 113 %
FEF2575-%Pred-Pre: 93 %
FEV1-%CHANGE-POST: 3 %
FEV1-%PRED-POST: 101 %
FEV1-%PRED-PRE: 98 %
FEV1-POST: 2.87 L
FEV1-PRE: 2.78 L
FEV1FVC-%Change-Post: 0 %
FEV1FVC-%Pred-Pre: 96 %
FEV6-%Change-Post: 3 %
FEV6-%PRED-POST: 110 %
FEV6-%PRED-PRE: 107 %
FEV6-POST: 4.14 L
FEV6-PRE: 4 L
FEV6FVC-%CHANGE-POST: 0 %
FEV6FVC-%PRED-PRE: 105 %
FEV6FVC-%Pred-Post: 105 %
FVC-%CHANGE-POST: 3 %
FVC-%PRED-PRE: 101 %
FVC-%Pred-Post: 105 %
FVC-POST: 4.22 L
FVC-PRE: 4.08 L
PRE FEV6/FVC RATIO: 98 %
Post FEV1/FVC ratio: 68 %
Post FEV6/FVC ratio: 98 %
Pre FEV1/FVC ratio: 68 %

## 2015-09-13 MED ORDER — ALBUTEROL SULFATE HFA 108 (90 BASE) MCG/ACT IN AERS: 2.0000 | INHALATION_SPRAY | RESPIRATORY_TRACT | 5 refills | Status: AC | PRN

## 2015-09-15 NOTE — H&P (Signed)
CC: BPH  HPI: Martin Leblanc is a 82 year-old male established patient who is here for follow up regarding further evaluation of BPH and lower urinary tract symptoms.   He continues to have stable symptoms overall and continues to take doxazosin 4 mg, Vesicare 10 mg, and intermittently Cialis 5 mg.   He states that he has had continued pneumaturia intermittently. I had previously asked him to notify me if this was occurring but he stated that it was not causing him any significant bother.  He was recently hospitalized with chest pain and SOB.  He has been evaluated by Dr. Marlou Porch.  He has moderate aortic stenosis and is at moderate risk for surgery.  He also has seen Dr. Lamonte Sakai and undergone PFTs.  He likely has a component of COPD and is taking albuterol.    ALLERGIES: No Allergies    MEDICATIONS: Amlodipine Besy-Benazepril HCl - 10-20 MG Oral Capsule Oral  Atorvastatin Calcium 40 MG Oral Tablet Oral  BD Pen Needle Nano U/F 32G X 4 MM Miscellaneous Does Not Apply  Cialis 5 MG Oral Tablet 0 Oral  Doxycycline Hyclate 100 MG Oral Capsule 0 Oral Twice daily  FreeStyle Lancets Miscellaneous Does Not Apply  HumaLOG Mix 50/50 KwikPen (50-50) 100 UNIT/ML Subcutaneous Suspension Pen-injector Subcutaneous  VESIcare 10 MG Oral Tablet 0 Oral     GU PSH: None     PSH Notes: Eye Surgery, Lung Surgery   NON-GU PSH: Eye Surgery Procedure, Left Lung Surgery (Unspecified) - 2008    GU PMH: BPH w/LUTS, Benign prostatic hyperplasia (BPH) with straining on urination - 09/13/2014 Elevated PSA, Elevated prostate specific antigen (PSA) - 08/18/2013      PMH Notes:  1) Elevated PSA: His PSA increased to 4.96 prompting a prostate biopsy in February 2010 which was negative for malignancy.   Feb 2010: Biopsy - Chronic inflammation, Vol 95.2 cc   2) BPH/LUTS: His baseline symptoms include nocturia 2-3 times per night, weak stream, and urge incontinence.   Current treatment: Doxazosin 4 mg, Vesicare  10 mg, Cialis 5 mg     NON-GU PMH: Bacteriuria, Bacteriuria, asymptomatic - 2014 Personal history of other diseases of the circulatory system, History of hypertension - 2014 Personal history of other endocrine, nutritional and metabolic disease, History of hypercholesterolemia - 2014, History of diabetes mellitus, - 2014    FAMILY HISTORY: Emphysema - Father Heart Disease - Father, Mother    Notes: 2 daughters, 2 sons   SOCIAL HISTORY: Marital Status: Married Patient does not smoke anymore.  Has not smoked since 06/21/1985.  Does not drink anymore.  Does not use drugs. Drinks 2 caffeinated drinks per day. Has not had a blood transfusion.     Notes: Former smoker, Alcohol Use, Marital History - Currently Married, Occupation:, Tobacco Use   REVIEW OF SYSTEMS:    GU Review Male:   Patient denies frequent urination, hard to postpone urination, burning/ pain with urination, get up at night to urinate, leakage of urine, stream starts and stops, trouble starting your streams, and have to strain to urinate .  Gastrointestinal (Upper):   Patient denies nausea and vomiting.  Gastrointestinal (Lower):   Patient reports constipation. Patient denies diarrhea.  Constitutional:   Patient denies fever, night sweats, weight loss, and fatigue.  Skin:   Patient denies skin rash/ lesion and itching.  Eyes:   Patient denies blurred vision and double vision.  Ears/ Nose/ Throat:   Patient denies sore throat and sinus problems.  Hematologic/Lymphatic:  Patient denies swollen glands and easy bruising.  Cardiovascular:   Patient denies leg swelling and chest pains.  Respiratory:   Patient reports shortness of breath. Patient denies cough.  Endocrine:   Patient denies excessive thirst.  Musculoskeletal:   Patient reports joint pain. Patient denies back pain.  Neurological:   Patient denies headaches and dizziness.  Psychologic:   Patient denies depression and anxiety.     MULTI-SYSTEM PHYSICAL  EXAMINATION:    Constitutional: Well-nourished. No physical deformities. Normally developed. Good grooming.  Neck: Neck symmetrical, not swollen. Normal tracheal position.  Respiratory: No labored breathing, no use of accessory muscles.   Cardiovascular: Normal temperature, normal extremity pulses, no swelling, no varicosities.  Lymphatic: No enlargement of neck, axillae, groin.  Skin: No paleness, no jaundice, no cyanosis. No lesion, no ulcer, no rash.  Neurologic / Psychiatric: Oriented to time, oriented to place, oriented to person. No depression, no anxiety, no agitation.  Gastrointestinal: No mass, no tenderness, no rigidity, non obese abdomen.  Eyes: Normal conjunctivae. Normal eyelids.  Ears, Nose, Mouth, and Throat: Left ear no scars, no lesions, no masses. Right ear no scars, no lesions, no masses. Nose no scars, no lesions, no masses. Normal hearing. Normal lips.  Musculoskeletal: Normal gait and station of he    ASSESSMENT:      ICD-10 Details  1 GU:   BPH w/LUTS - N40.1   2   Straining to void - R39.16         Notes:   1. Left-sided bladder lesion with chronic bacteriuria and pneumaturia: He will undergo CT imaging of the abdomen and pelvis for further evaluation with IV and oral contrast. I will also schedule for cystoscopy, left retrograde pyelography, and bladder biopsy as well as a possible intraoperative cystogram for further evaluation. He will be treated with appropriate preoperative antibiotics pending his culture results. We reviewed the potential risks and complications as well as expected recovery process of this procedure. He gives informed consent.

## 2015-09-18 ENCOUNTER — Ambulatory Visit (HOSPITAL_COMMUNITY): Payer: Medicare Other | Admitting: Anesthesiology

## 2015-09-18 ENCOUNTER — Encounter (HOSPITAL_COMMUNITY)
Admission: RE | Disposition: A | Discharge status: Home / Self Care | Payer: Self-pay | Source: Ambulatory Visit | Attending: Urology

## 2015-09-18 ENCOUNTER — Ambulatory Visit (HOSPITAL_COMMUNITY)
Admission: RE | Admit: 2015-09-18 | Discharge: 2015-09-18 | Disposition: A | Discharge status: Home / Self Care | Payer: Medicare Other | Source: Ambulatory Visit | Attending: Urology | Admitting: Urology

## 2015-09-18 ENCOUNTER — Encounter (HOSPITAL_COMMUNITY): Payer: Self-pay | Admitting: Registered Nurse

## 2015-09-18 DIAGNOSIS — R3989 Other symptoms and signs involving the genitourinary system: Secondary | ICD-10-CM | POA: Diagnosis not present

## 2015-09-18 DIAGNOSIS — N3020 Other chronic cystitis without hematuria: Secondary | ICD-10-CM | POA: Insufficient documentation

## 2015-09-18 DIAGNOSIS — Z794 Long term (current) use of insulin: Secondary | ICD-10-CM | POA: Diagnosis not present

## 2015-09-18 DIAGNOSIS — Z79899 Other long term (current) drug therapy: Secondary | ICD-10-CM | POA: Insufficient documentation

## 2015-09-18 DIAGNOSIS — E7800 Pure hypercholesterolemia, unspecified: Secondary | ICD-10-CM | POA: Diagnosis not present

## 2015-09-18 DIAGNOSIS — I350 Nonrheumatic aortic (valve) stenosis: Secondary | ICD-10-CM | POA: Diagnosis not present

## 2015-09-18 DIAGNOSIS — I10 Essential (primary) hypertension: Secondary | ICD-10-CM | POA: Diagnosis not present

## 2015-09-18 DIAGNOSIS — R8271 Bacteriuria: Secondary | ICD-10-CM | POA: Diagnosis not present

## 2015-09-18 DIAGNOSIS — Z87891 Personal history of nicotine dependence: Secondary | ICD-10-CM | POA: Diagnosis not present

## 2015-09-18 DIAGNOSIS — E119 Type 2 diabetes mellitus without complications: Secondary | ICD-10-CM | POA: Insufficient documentation

## 2015-09-18 DIAGNOSIS — N401 Enlarged prostate with lower urinary tract symptoms: Secondary | ICD-10-CM | POA: Diagnosis not present

## 2015-09-18 DIAGNOSIS — N329 Bladder disorder, unspecified: Secondary | ICD-10-CM | POA: Diagnosis present

## 2015-09-18 DIAGNOSIS — R3916 Straining to void: Secondary | ICD-10-CM | POA: Insufficient documentation

## 2015-09-18 HISTORY — PX: CYSTOSCOPY W/ RETROGRADES: SHX1426

## 2015-09-18 HISTORY — PX: CYSTOSCOPY WITH BIOPSY: SHX5122

## 2015-09-18 LAB — GLUCOSE, CAPILLARY
GLUCOSE-CAPILLARY: 180 mg/dL — AB (ref 65–99)
Glucose-Capillary: 140 mg/dL — ABNORMAL HIGH (ref 65–99)

## 2015-09-18 SURGERY — CYSTOSCOPY, WITH BIOPSY
Anesthesia: General

## 2015-09-18 MED ORDER — FENTANYL CITRATE (PF) 100 MCG/2ML IJ SOLN
INTRAMUSCULAR | Status: AC
  Filled 2015-09-18: qty 2

## 2015-09-18 MED ORDER — ONDANSETRON HCL 4 MG/2ML IJ SOLN: 4.0000 mg | Freq: Once | INTRAMUSCULAR | Status: DC | PRN

## 2015-09-18 MED ORDER — FENTANYL CITRATE (PF) 100 MCG/2ML IJ SOLN: 25.0000 ug | INTRAMUSCULAR | Status: DC | PRN

## 2015-09-18 MED ORDER — DIATRIZOATE MEGLUMINE 30 % UR SOLN
URETHRAL | Status: AC
  Filled 2015-09-18: qty 100

## 2015-09-18 MED ORDER — DIATRIZOATE MEGLUMINE 30 % UR SOLN
URETHRAL | Status: DC | PRN
  Administered 2015-09-18: 100 mL

## 2015-09-18 MED ORDER — PHENYLEPHRINE HCL 10 MG/ML IJ SOLN
INTRAMUSCULAR | Status: DC | PRN
  Administered 2015-09-18: 80 ug via INTRAVENOUS

## 2015-09-18 MED ORDER — ONDANSETRON HCL 4 MG/2ML IJ SOLN
INTRAMUSCULAR | Status: AC
  Filled 2015-09-18: qty 2

## 2015-09-18 MED ORDER — PROPOFOL 10 MG/ML IV BOLUS
INTRAVENOUS | Status: AC
  Filled 2015-09-18: qty 20

## 2015-09-18 MED ORDER — CIPROFLOXACIN IN D5W 400 MG/200ML IV SOLN
400.0000 mg | INTRAVENOUS | Status: AC
  Administered 2015-09-18: 400 mg via INTRAVENOUS

## 2015-09-18 MED ORDER — EPHEDRINE SULFATE 50 MG/ML IJ SOLN
INTRAMUSCULAR | Status: AC
  Filled 2015-09-18: qty 1

## 2015-09-18 MED ORDER — PHENAZOPYRIDINE HCL 100 MG PO TABS
100.0000 mg | ORAL_TABLET | Freq: Three times a day (TID) | ORAL | 0 refills | Status: DC | PRN
Start: 2015-09-18 — End: 2015-11-08

## 2015-09-18 MED ORDER — SODIUM CHLORIDE 0.9 % IR SOLN
Status: DC | PRN
  Administered 2015-09-18: 1000 mL via INTRAVESICAL

## 2015-09-18 MED ORDER — ONDANSETRON HCL 4 MG/2ML IJ SOLN
INTRAMUSCULAR | Status: DC | PRN
  Administered 2015-09-18: 4 mg via INTRAVENOUS

## 2015-09-18 MED ORDER — FENTANYL CITRATE (PF) 100 MCG/2ML IJ SOLN
INTRAMUSCULAR | Status: DC | PRN
  Administered 2015-09-18 (×4): 25 ug via INTRAVENOUS

## 2015-09-18 MED ORDER — MEPERIDINE HCL 50 MG/ML IJ SOLN: 6.2500 mg | INTRAMUSCULAR | Status: DC | PRN

## 2015-09-18 MED ORDER — SODIUM CHLORIDE 0.9 % IJ SOLN
INTRAMUSCULAR | Status: AC
  Filled 2015-09-18: qty 10

## 2015-09-18 MED ORDER — LIDOCAINE HCL (CARDIAC) 10 MG/ML IV SOLN
INTRAVENOUS | Status: DC | PRN
Start: 2015-09-18 — End: 2015-09-18
  Administered 2015-09-18: 100 mg via INTRAVENOUS

## 2015-09-18 MED ORDER — STERILE WATER FOR IRRIGATION IR SOLN
Status: DC | PRN
  Administered 2015-09-18: 3000 mL

## 2015-09-18 MED ORDER — CIPROFLOXACIN HCL 500 MG PO TABS: 500.0000 mg | ORAL_TABLET | Freq: Two times a day (BID) | ORAL | 0 refills | Status: DC

## 2015-09-18 MED ORDER — PHENAZOPYRIDINE HCL 100 MG PO TABS
100.0000 mg | ORAL_TABLET | Freq: Once | ORAL | Status: AC
  Administered 2015-09-18: 100 mg via ORAL
  Filled 2015-09-18 (×2): qty 1

## 2015-09-18 MED ORDER — PROPOFOL 10 MG/ML IV BOLUS
INTRAVENOUS | Status: DC | PRN
  Administered 2015-09-18: 150 mg via INTRAVENOUS

## 2015-09-18 MED ORDER — PHENYLEPHRINE 40 MCG/ML (10ML) SYRINGE FOR IV PUSH (FOR BLOOD PRESSURE SUPPORT)
PREFILLED_SYRINGE | INTRAVENOUS | Status: AC
  Filled 2015-09-18: qty 10

## 2015-09-18 MED ORDER — LACTATED RINGERS IV SOLN
INTRAVENOUS | Status: DC | PRN
  Administered 2015-09-18: 14:00:00 via INTRAVENOUS

## 2015-09-18 MED ORDER — LIDOCAINE HCL (CARDIAC) 20 MG/ML IV SOLN
INTRAVENOUS | Status: AC
  Filled 2015-09-18: qty 5

## 2015-09-18 SURGICAL SUPPLY — 18 items
BAG URINE DRAINAGE (UROLOGICAL SUPPLIES) IMPLANT
BAG URO CATCHER STRL LF (MISCELLANEOUS) ×4 IMPLANT
CATH INTERMIT  6FR 70CM (CATHETERS) ×4 IMPLANT
CLOTH BEACON ORANGE TIMEOUT ST (SAFETY) ×4 IMPLANT
ELECT REM PT RETURN 9FT ADLT (ELECTROSURGICAL) ×4
ELECTRODE REM PT RTRN 9FT ADLT (ELECTROSURGICAL) ×2 IMPLANT
EVACUATOR MICROVAS BLADDER (UROLOGICAL SUPPLIES) IMPLANT
GLOVE BIOGEL M STRL SZ7.5 (GLOVE) ×4 IMPLANT
GOWN STRL REUS W/TWL LRG LVL3 (GOWN DISPOSABLE) ×8 IMPLANT
GUIDEWIRE STR DUAL SENSOR (WIRE) ×4 IMPLANT
LOOP CUT BIPOLAR 24F LRG (ELECTROSURGICAL) IMPLANT
MANIFOLD NEPTUNE II (INSTRUMENTS) ×4 IMPLANT
PACK CYSTO (CUSTOM PROCEDURE TRAY) ×4 IMPLANT
SET ASPIRATION TUBING (TUBING) IMPLANT
SYR 50ML LL SCALE MARK (SYRINGE) ×4 IMPLANT
SYRINGE IRR TOOMEY STRL 70CC (SYRINGE) IMPLANT
TUBING CONNECTING 10 (TUBING) ×3 IMPLANT
TUBING CONNECTING 10' (TUBING) ×1

## 2015-09-18 NOTE — Anesthesia Procedure Notes (Signed)
Procedure Name: LMA Insertion Date/Time: 09/18/2015 2:28 PM Performed by: Maxwell Caul Pre-anesthesia Checklist: Patient identified, Emergency Drugs available, Suction available and Patient being monitored Patient Re-evaluated:Patient Re-evaluated prior to inductionOxygen Delivery Method: Circle system utilized Preoxygenation: Pre-oxygenation with 100% oxygen Intubation Type: IV induction LMA: LMA inserted and LMA with gastric port inserted LMA Size: 5.0 Number of attempts: 1 Placement Confirmation: positive ETCO2 and breath sounds checked- equal and bilateral Tube secured with: Tape Dental Injury: Teeth and Oropharynx as per pre-operative assessment

## 2015-09-18 NOTE — Transfer of Care (Signed)
Immediate Anesthesia Transfer of Care Note  Patient: Martin Leblanc  Procedure(s) Performed: Procedure(s): CYSTOSCOPY WITH BLADDER BIOPSY (N/A) CYSTOSCOPY WITH RETROGRADE PYELOGRAM (Left)  Patient Location: PACU  Anesthesia Type:General  Level of Consciousness:  sedated, patient cooperative and responds to stimulation  Airway & Oxygen Therapy:Patient Spontanous Breathing and Patient connected to face mask oxgen  Post-op Assessment:  Report given to PACU RN and Post -op Vital signs reviewed and stable  Post vital signs:  Reviewed and stable  Last Vitals:  Vitals:   09/18/15 1308 09/18/15 1512  BP: 119/69   Pulse: 95 85  Resp: 18 10  Temp: 123XX123 C     Complications: No apparent anesthesia complications

## 2015-09-18 NOTE — Anesthesia Postprocedure Evaluation (Signed)
Anesthesia Post Note  Patient: Martin Leblanc  Procedure(s) Performed: Procedure(s) (LRB): CYSTOSCOPY WITH BLADDER BIOPSY (N/A) CYSTOSCOPY WITH RETROGRADE PYELOGRAM (Left)  Patient location during evaluation: PACU Anesthesia Type: General Level of consciousness: sedated Pain management: satisfactory to patient Vital Signs Assessment: post-procedure vital signs reviewed and stable Respiratory status: spontaneous breathing Cardiovascular status: stable Anesthetic complications: no    Last Vitals:  Vitals:   09/18/15 1544 09/18/15 1635  BP: 128/62 136/66  Pulse: 83 93  Resp:  18  Temp: 36.5 C 36.6 C    Last Pain:  Vitals:   09/18/15 1635  TempSrc:   PainSc: 3                  Marquita Lias EDWARD

## 2015-09-18 NOTE — Op Note (Signed)
Preoperative diagnosis: Bladder lesion, recurrent bacteriuria, pneumaturia  Postoperative diagnosis: Same as above  Procedures: 1.  Cystoscopy 2.  Left retrograde pyelography with interpretation 3.  Cystogram with interpretation 4.  Bladder biopsies 4   Surgeon: Pryor Curia. M.D.  Anesthesia: General   Complications: None  EBL: Minimal  Intraoperative findings: Left retrograde pyelography was performed with a 6 French ureteral catheter and Omnipaque contrast.  No filling defects or other abnormalities were noted of the ureter or left renal collecting system.  No fistula was noted.  A cystogram was performed with 350 cc of contrast was instilled via the cystoscope allowing for adequate distention of the bladder.  Trabeculation was noted without evidence of extravasation or fistulous communication.  A post drainage film was obtained without extravasation.  Indication: Martin Leblanc is an 82 year old gentleman who has had persistent pneumaturia and recurrent bacteriuria.  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  However, his symptoms have persisted.  He fortunately has not developed systemic symptoms and not had febrile infections.  He presents today for further evaluation with endoscopy and imaging studies to rule out a possible fistula and to biopsy the abnormal erythematous area noted on office cystoscopy recently.  The potential risks, complications, and expected recovery processes of this procedure have been discussed in detail and he has given informed consent.  Description of procedure: The patient was taken to the operating room and a general anesthetic was administered.  He was given preoperative antibiotics, placed in the dorsal lithotomy position, and prepped and draped in the usual sterile fashion.  Preoperative timeout was performed.  Cystourethroscopy was performed with a 21 French cystoscope sheath.   A 30 and 70 lens were used to fully inspect the bladder.  There continued to be an erythematous area overlying the left side of the bladder extending from the left hemitrigone, surrounding the left ureteral orifice and extending toward the left lateral bladder wall.  This area measured 3-4 cm in total.  No definite tumors were identified.  No other mucosal pathology was identified aside from mild to moderate trabeculation.  A 6 French ureteral catheter was then used to intubate the left ureteral orifice.  A retrograde pyelogram was performed with findings as dictated above.  No abnormalities were noted.  Attention then returned to the bladder.  The bladder was then filled to 350 cc with contrast.  Periodic images were obtained under fluoroscopy.  Findings are as dictated above.  Specifically, no fistulous communication was noted.  Attention then returned to the bladder.  A cold cup biopsy forceps was used to perform multiple biopsies of the erythematous area of the bladder.  Once these representative samples were biopsied, hemostasis was attained with a Bugbee cautery electrode.  The bladder was emptied and again filled and reinspected.  Hemostasis appeared excellent.  The procedure was ended.  He tolerated the procedure well without complications.  He was able to be transferred to the recovery unit in satisfactory condition.

## 2015-09-18 NOTE — Discharge Instructions (Addendum)
1. You may see some blood in the urine and may have some burning with urination for 48-72 hours. You also may notice that you have to urinate more frequently or urgently after your procedure which is normal.  2. You should call should you develop an inability urinate, fever > 101, persistent nausea and vomiting that prevents you from eating or drinking to stay hydrated.  General Anesthesia, Adult, Care After Refer to this sheet in the next few weeks. These instructions provide you with information on caring for yourself after your procedure. Your health care provider may also give you more specific instructions. Your treatment has been planned according to current medical practices, but problems sometimes occur. Call your health care provider if you have any problems or questions after your procedure. WHAT TO EXPECT AFTER THE PROCEDURE After the procedure, it is typical to experience:  Sleepiness.  Nausea and vomiting. HOME CARE INSTRUCTIONS  For the first 24 hours after general anesthesia:  Have a responsible person with you.  Do not drive a car. If you are alone, do not take public transportation.  Do not drink alcohol.  Do not take medicine that has not been prescribed by your health care provider.  Do not sign important papers or make important decisions.  You may resume a normal diet and activities as directed by your health care provider.  Change bandages (dressings) as directed.  If you have questions or problems that seem related to general anesthesia, call the hospital and ask for the anesthetist or anesthesiologist on call. SEEK MEDICAL CARE IF:  You have nausea and vomiting that continue the day after anesthesia.  You develop a rash. SEEK IMMEDIATE MEDICAL CARE IF:   You have difficulty breathing.  You have chest pain.  You have any allergic problems.   This information is not intended to replace advice given to you by your health care provider. Make sure you  discuss any questions you have with your health care provider.   Document Released: 04/15/2000 Document Revised: 01/28/2014 Document Reviewed: 05/08/2011 Elsevier Interactive Patient Education [employer].

## 2015-10-28 ENCOUNTER — Other Ambulatory Visit: Payer: Self-pay | Admitting: Internal Medicine

## 2015-10-30 NOTE — Telephone Encounter (Signed)
d/c'd from hospital (teaching service) 07/26/15, pt's pcp is Reynold Bowen, MD

## 2015-11-01 ENCOUNTER — Other Ambulatory Visit: Payer: Self-pay | Admitting: Surgery

## 2015-11-01 DIAGNOSIS — K6389 Other specified diseases of intestine: Secondary | ICD-10-CM

## 2015-11-03 ENCOUNTER — Other Ambulatory Visit: Payer: Self-pay | Admitting: Surgery

## 2015-11-06 ENCOUNTER — Ambulatory Visit
Admission: RE | Admit: 2015-11-06 | Discharge: 2015-11-06 | Disposition: A | Discharge status: Home / Self Care | Payer: Medicare Other | Source: Ambulatory Visit | Attending: Surgery | Admitting: Surgery

## 2015-11-06 DIAGNOSIS — K6389 Other specified diseases of intestine: Secondary | ICD-10-CM

## 2015-11-06 MED ORDER — IOPAMIDOL (ISOVUE-300) INJECTION 61%
125.0000 mL | Freq: Once | INTRAVENOUS | Status: AC | PRN
  Administered 2015-11-06: 125 mL via INTRAVENOUS

## 2015-11-07 ENCOUNTER — Telehealth: Payer: Self-pay | Admitting: Oncology

## 2015-11-07 ENCOUNTER — Telehealth: Payer: Self-pay | Admitting: *Deleted

## 2015-11-07 NOTE — Telephone Encounter (Signed)
Oncology Nurse Navigator Documentation  Oncology Nurse Navigator Flowsheets 11/07/2015  Navigator Location [facility]  Navigator Encounter Type Introductory phone call  Abnormal Finding Date 11/06/2015  Confirmed Diagnosis Date 11/07/2015  Spoke with daughter, Martin Leblanc and provided new patient appointment for 11/10/15 at 0830/0845 with Dr. Burr Medico in [facility] [place]. Informed of location of [place], valet service, and registration process. Reminded to bring photo ID, insurance cards and a current medication list, including supplements. She verbalizes understanding. He will be seen by medical oncology, dietician and physical therapy. Message to HIM to enter appointment into EPIC. Daughter has copy of his pathology report and will be faxing it to [facility] later today. She has already faxed it to [facility] and they have the colonoscopy report from GI in [facility]. Message to HIM to request the colonoscopy report and CEA results from [place].

## 2015-11-07 NOTE — Progress Notes (Deleted)
[place] [facility]  [phone] Fax:([phone]) [phone]  Clinic New Consult Note   Patient Care Team: Reynold Bowen, MD as PCP - General (Endocrinology) 11/07/2015  CHIEF COMPLAINTS/PURPOSE OF CONSULTATION:  ***  HISTORY OF PRESENTING ILLNESS:  Martin Leblanc 82 y.o. male is here because of ***  MEDICAL HISTORY:  Past Medical History:  Diagnosis Date  . Diabetes mellitus without complication (HCC)    Insulin use  . Edema extremities    right greater than left feet.  Marland Kitchen GERD (gastroesophageal reflux disease)   . Hypertension   . Malignant melanoma ([place])    s/p treatment 25 yrs ago-left eye  . Shortness of breath dyspnea    recent had hospital visit 7'17 [facility]- slightly improved.  . Sight impaired    left eye- hx melanoma/radiation    SURGICAL HISTORY: Past Surgical History:  Procedure Laterality Date  . CYSTOSCOPY W/ RETROGRADES Left 09/18/2015   Procedure: CYSTOSCOPY WITH RETROGRADE PYELOGRAM;  Surgeon: Raynelle Bring, MD;  Location: [facility];  Service: Urology;  Laterality: Left;  . CYSTOSCOPY WITH BIOPSY N/A 09/18/2015   Procedure: CYSTOSCOPY WITH BLADDER BIOPSY;  Surgeon: Raynelle Bring, MD;  Location: [facility];  Service: Urology;  Laterality: N/A;  . EYE SURGERY Left    laser eye surgery, after radiation for melanoma tx  . INGUINAL HERNIA REPAIR     early 2000s    SOCIAL HISTORY: Social History   Social History  . Marital status: Married    Spouse name: N/A  . Number of children: 4  . Years of education: N/A   Occupational History  . Not on file.   Social History Main Topics  . Smoking status: Former Smoker    Packs/day: 0.50    Years: 20.00    Types: Cigarettes    Quit date: 09/06/1973  . Smokeless tobacco: Never Used     Comment: quit 45 years ago  . Alcohol use No  . Drug use: No  . Sexual activity: Not Currently   Other Topics Concern  . 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 Diabetes Maternal Grandmother   . Emphysema Father     ALLERGIES:  has No Known Allergies.  MEDICATIONS:  Current Outpatient Prescriptions  Medication Sig Dispense Refill  . albuterol (PROVENTIL HFA;VENTOLIN HFA) 108 (90 Base) MCG/ACT inhaler Inhale 2 puffs into the lungs every 4 (four) hours as needed for wheezing or shortness of breath. 1 Inhaler 5  . amLODipine-benazepril (LOTREL) 5-10 MG capsule Take 1 capsule by mouth daily. 30 capsule 11  . antiseptic oral rinse (BIOTENE) LIQD 15 mLs by Mouth Rinse route as needed for dry mouth.    Marland Kitchen atorvastatin (LIPITOR) 20 MG tablet Take 20 mg by mouth daily.    . ciprofloxacin (CIPRO) 500 MG tablet Take 1 tablet (500 mg total) by mouth 2 (two) times daily. 6 tablet 0  . doxazosin (CARDURA) 4 MG tablet Take 4 mg by mouth daily.    . ferrous sulfate 325 (65 FE) MG tablet Take 1 tablet (325 mg total) by mouth daily with breakfast. 30 tablet 3  . HUMALOG MIX 50/50 KWIKPEN (50-50) 100 UNIT/ML Kwikpen Inject 40-50 Units into the skin See admin instructions. Pt uses 40 units at breakfast and dinner and if BS is high,  uses 50 units at bedtime    . Hypromellose (ARTIFICIAL TEARS OP) Place 1 drop into both eyes daily as needed (dry eyes).    Marland Kitchen omeprazole (PRILOSEC) 20 MG capsule Take 20 mg by mouth daily.    . phenazopyridine (PYRIDIUM) 100 MG tablet Take 1 tablet (100 mg total) by mouth 3 (three) times daily as needed for pain (for burning). 20 tablet 0  . senna-docusate (SENOKOT-S) 8.6-50 MG tablet Take 2 tablets by mouth daily as needed for mild constipation. 30 tablet 0  . solifenacin (VESICARE) 10 MG tablet Take 10 mg by mouth daily.    Marland Kitchen Specialty Vitamins Products (PROSTATE PO) Take 1 capsule by mouth daily.    . SYMBICORT 160-4.5 MCG/ACT inhaler Inhale 1-2 puffs into the lungs 2 (two) times daily as needed  (shortness of breath).   4   No current facility-administered medications for this visit.     REVIEW OF SYSTEMS:   Constitutional: Denies fevers, chills or abnormal night sweats Eyes: Denies blurriness of vision, double vision or watery eyes Ears, nose, mouth, throat, and face: Denies mucositis or sore throat Respiratory: Denies cough, dyspnea or wheezes Cardiovascular: Denies palpitation, chest discomfort or lower extremity swelling Gastrointestinal:  Denies nausea, heartburn or change in bowel habits Skin: Denies abnormal skin rashes Lymphatics: Denies new lymphadenopathy or easy bruising Neurological:Denies numbness, tingling or new weaknesses Behavioral/Psych: Mood is stable, no new changes  All other systems were reviewed with the patient and are negative.  PHYSICAL EXAMINATION: ECOG PERFORMANCE STATUS: {CHL ONC ECOG PS:[phone]}  There were no vitals filed for this visit. There were no vitals filed for this visit.  GENERAL:alert, no distress and comfortable SKIN: skin color, texture, turgor are normal, no rashes or significant lesions EYES: normal, conjunctiva are pink and non-injected, sclera clear OROPHARYNX:no exudate, no erythema and lips, buccal mucosa, and tongue normal  NECK: supple, thyroid normal size, non-tender, without nodularity LYMPH:  no palpable lymphadenopathy in the cervical, axillary or inguinal LUNGS: clear to auscultation and percussion with normal breathing effort HEART: regular rate & rhythm and no murmurs and no lower extremity edema ABDOMEN:abdomen soft, non-tender and normal bowel sounds Musculoskeletal:no cyanosis of digits and no clubbing  PSYCH: alert & oriented x 3 with fluent speech NEURO: no focal motor/sensory deficits  LABORATORY DATA:  I have reviewed the data as listed CBC Latest Ref Rng & Units 09/07/2015 07/26/2015 07/25/2015  WBC 4.0 - 10.5 K/uL 9.3 9.5 9.1  Hemoglobin 13.0 - 17.0 g/dL 10.5(L) 8.7(L) 8.1(L)  Hematocrit 39.0 - 52.0 %  34.5(L) 29.4(L) 27.4(L)  Platelets 150 - 400 K/uL 338 262 261    @cmpl @  RADIOGRAPHIC STUDIES: I have personally reviewed the radiological images as listed and agreed with the findings in the report. Ct Abdomen Pelvis W Contrast  Result Date: 11/06/2015 CLINICAL DATA:  Colonoscopy, cecal mass EXAM: CT ABDOMEN AND PELVIS WITH CONTRAST TECHNIQUE: Multidetector CT imaging of the abdomen and pelvis was performed using the standard protocol following bolus administration of intravenous contrast. CONTRAST:  164mL ISOVUE-300 IOPAMIDOL (ISOVUE-300) INJECTION 61% COMPARISON:  CT chest 07/24/2015 FINDINGS: *Lower chest: There is bilateral small pleural effusion. There is intercostal herniation of the lung parenchyma in right chest wall laterally please see axial image 10. A nodular mass right lower lobe posteriorly has increased in size from prior exam measures 4.8 cm highly suspicious for metastatic disease. Hiatal hernia is noted. There is thickening of distal esophageal wall. Neoplastic process cannot be excluded. Please see axial image 15. Hepatobiliary: Innumerable  nodular lesions are noted within liver consistent with metastatic disease. Pancreas: Enhanced pancreas is unremarkable. Spleen: Enhanced spleen is unremarkable. There is perihepatic and perisplenic ascites. There is a diaphragmatic nodule in left anterior diaphragm measures 1.5 cm suspicious for metastatic disease. Adrenals/Urinary Tract: No adrenal gland mass. No hydronephrosis or hydroureter. There is a probable cyst within right kidney anteriorly measures 3 cm. Stomach/Bowel: There is perihepatic and perisplenic ascites. Perigastric ascites is noted. There is focal thickening of distal gastric wall anteriorly measures 1.9 cm. This is highly suspicious for a gastric mass. Correlation with endoscopy is recommended. No small bowel obstruction. Normal appendix is partially visualized. There is irregular thickening of cecal wall. Cecal mass cannot be  excluded. No colonic obstruction. Vascular/Lymphatic: A retroperitoneal lymph node aortocaval region measures 1.4 cm suspicious for metastatic disease. There is nodular omental thickening highly suspicious for omental metastatic disease and omental caking the largest deposit measures 1.9 cm. No aortic aneurysm. Atherosclerotic calcifications of abdominal aorta and iliac arteries are noted. Reproductive: Enlarged prostate gland. The prostate gland measures 6.3 by 5.2 cm. Other: There is a right inguinal scrotal canal hernia containing fat measures 3.2 cm. Small nonspecific bilateral inguinal lymph nodes are noted. Musculoskeletal: Sagittal images of the spine shows degenerative changes thoracolumbar spine. Degenerative changes are noted pubic symphysis. IMPRESSION: 1. There is intercostal herniation of the lung parenchyma in right chest wall laterally measures about 6 cm without evidence of acute complication. Progression in size of nodular mass in right lower lobe pleural based measures 4.8 cm suspicious for metastatic disease. 2. There is thickening of distal esophageal wall. Neoplastic process cannot be excluded. 3. Innumerable nodular lesions are noted throughout the liver suspicious for metastatic disease. 4. There is focal thickening of distal gastric wall axial image 32 highly suspicious for a gastric mass. 5. There is abdominal and pelvic ascites. Nodular thickening of the omentum highly suspicious for metastatic disease and omental caking. Irregular thickening of cecal wall suspicious for a mass please see axial image 53 6. No distal colonic obstruction. 7. Enlarged prostate gland with indentation of urinary bladder base. 8. Degenerative changes thoracolumbar spine. Electronically Signed   By: Lahoma Crocker M.D.   On: 11/06/2015 17:20    ASSESSMENT & PLAN:  *** No orders of the defined types were placed in this encounter.   All questions were answered. The patient knows to call the clinic with any  problems, questions or concerns. I spent {CHL ONC TIME VISIT - [id number] counseling the patient face to face. The total time spent in the appointment was {CHL ONC TIME VISIT - [id number] and more than 50% was on counseling.     Truitt Merle, MD 11/07/2015 11:10 PM

## 2015-11-07 NOTE — Telephone Encounter (Signed)
Lt mess regarding scheduling appt for new pt.

## 2015-11-08 ENCOUNTER — Emergency Department (HOSPITAL_COMMUNITY): Payer: Medicare Other

## 2015-11-08 ENCOUNTER — Telehealth: Payer: Self-pay | Admitting: Hematology

## 2015-11-08 ENCOUNTER — Telehealth: Payer: Self-pay | Admitting: *Deleted

## 2015-11-08 ENCOUNTER — Encounter (HOSPITAL_COMMUNITY): Payer: Self-pay | Admitting: Family Medicine

## 2015-11-08 ENCOUNTER — Other Ambulatory Visit: Payer: Self-pay | Admitting: *Deleted

## 2015-11-08 ENCOUNTER — Inpatient Hospital Stay (HOSPITAL_COMMUNITY): Payer: Medicare Other

## 2015-11-08 ENCOUNTER — Inpatient Hospital Stay (HOSPITAL_COMMUNITY)
Admission: EM | Admit: 2015-11-08 | Discharge: 2015-11-16 | DRG: 175 | Disposition: A | Discharge status: Skilled Nursing Facility | Payer: Medicare Other | Attending: Internal Medicine | Admitting: Internal Medicine

## 2015-11-08 DIAGNOSIS — C787 Secondary malignant neoplasm of liver and intrahepatic bile duct: Secondary | ICD-10-CM | POA: Diagnosis present

## 2015-11-08 DIAGNOSIS — Z66 Do not resuscitate: Secondary | ICD-10-CM | POA: Diagnosis not present

## 2015-11-08 DIAGNOSIS — Z87891 Personal history of nicotine dependence: Secondary | ICD-10-CM

## 2015-11-08 DIAGNOSIS — E876 Hypokalemia: Secondary | ICD-10-CM | POA: Diagnosis present

## 2015-11-08 DIAGNOSIS — R0602 Shortness of breath: Secondary | ICD-10-CM | POA: Diagnosis present

## 2015-11-08 DIAGNOSIS — M7989 Other specified soft tissue disorders: Secondary | ICD-10-CM | POA: Diagnosis not present

## 2015-11-08 DIAGNOSIS — I82401 Acute embolism and thrombosis of unspecified deep veins of right lower extremity: Secondary | ICD-10-CM | POA: Diagnosis present

## 2015-11-08 DIAGNOSIS — Z7189 Other specified counseling: Secondary | ICD-10-CM | POA: Diagnosis not present

## 2015-11-08 DIAGNOSIS — J9601 Acute respiratory failure with hypoxia: Secondary | ICD-10-CM | POA: Diagnosis not present

## 2015-11-08 DIAGNOSIS — C799 Secondary malignant neoplasm of unspecified site: Secondary | ICD-10-CM | POA: Diagnosis not present

## 2015-11-08 DIAGNOSIS — I2602 Saddle embolus of pulmonary artery with acute cor pulmonale: Secondary | ICD-10-CM | POA: Diagnosis not present

## 2015-11-08 DIAGNOSIS — I480 Paroxysmal atrial fibrillation: Secondary | ICD-10-CM | POA: Diagnosis present

## 2015-11-08 DIAGNOSIS — Z79899 Other long term (current) drug therapy: Secondary | ICD-10-CM

## 2015-11-08 DIAGNOSIS — R0600 Dyspnea, unspecified: Secondary | ICD-10-CM | POA: Diagnosis not present

## 2015-11-08 DIAGNOSIS — D508 Other iron deficiency anemias: Secondary | ICD-10-CM | POA: Diagnosis present

## 2015-11-08 DIAGNOSIS — I350 Nonrheumatic aortic (valve) stenosis: Secondary | ICD-10-CM | POA: Diagnosis present

## 2015-11-08 DIAGNOSIS — J449 Chronic obstructive pulmonary disease, unspecified: Secondary | ICD-10-CM | POA: Diagnosis present

## 2015-11-08 DIAGNOSIS — K5900 Constipation, unspecified: Secondary | ICD-10-CM | POA: Diagnosis present

## 2015-11-08 DIAGNOSIS — Z85820 Personal history of malignant melanoma of skin: Secondary | ICD-10-CM | POA: Diagnosis not present

## 2015-11-08 DIAGNOSIS — C786 Secondary malignant neoplasm of retroperitoneum and peritoneum: Secondary | ICD-10-CM | POA: Diagnosis present

## 2015-11-08 DIAGNOSIS — Z794 Long term (current) use of insulin: Secondary | ICD-10-CM

## 2015-11-08 DIAGNOSIS — R188 Other ascites: Secondary | ICD-10-CM | POA: Insufficient documentation

## 2015-11-08 DIAGNOSIS — C189 Malignant neoplasm of colon, unspecified: Secondary | ICD-10-CM | POA: Diagnosis not present

## 2015-11-08 DIAGNOSIS — Z515 Encounter for palliative care: Secondary | ICD-10-CM | POA: Diagnosis not present

## 2015-11-08 DIAGNOSIS — R918 Other nonspecific abnormal finding of lung field: Secondary | ICD-10-CM | POA: Diagnosis not present

## 2015-11-08 DIAGNOSIS — R180 Malignant ascites: Secondary | ICD-10-CM | POA: Diagnosis present

## 2015-11-08 DIAGNOSIS — K6389 Other specified diseases of intestine: Secondary | ICD-10-CM

## 2015-11-08 DIAGNOSIS — C7801 Secondary malignant neoplasm of right lung: Secondary | ICD-10-CM | POA: Diagnosis present

## 2015-11-08 DIAGNOSIS — N400 Enlarged prostate without lower urinary tract symptoms: Secondary | ICD-10-CM | POA: Diagnosis present

## 2015-11-08 DIAGNOSIS — Z01818 Encounter for other preprocedural examination: Secondary | ICD-10-CM

## 2015-11-08 DIAGNOSIS — K219 Gastro-esophageal reflux disease without esophagitis: Secondary | ICD-10-CM | POA: Diagnosis present

## 2015-11-08 DIAGNOSIS — Z833 Family history of diabetes mellitus: Secondary | ICD-10-CM | POA: Diagnosis not present

## 2015-11-08 DIAGNOSIS — R5383 Other fatigue: Secondary | ICD-10-CM | POA: Diagnosis not present

## 2015-11-08 DIAGNOSIS — I10 Essential (primary) hypertension: Secondary | ICD-10-CM | POA: Diagnosis present

## 2015-11-08 DIAGNOSIS — Z86711 Personal history of pulmonary embolism: Secondary | ICD-10-CM | POA: Diagnosis not present

## 2015-11-08 DIAGNOSIS — H547 Unspecified visual loss: Secondary | ICD-10-CM | POA: Diagnosis present

## 2015-11-08 DIAGNOSIS — Z825 Family history of asthma and other chronic lower respiratory diseases: Secondary | ICD-10-CM | POA: Diagnosis not present

## 2015-11-08 DIAGNOSIS — I4891 Unspecified atrial fibrillation: Secondary | ICD-10-CM | POA: Diagnosis not present

## 2015-11-08 DIAGNOSIS — I2782 Chronic pulmonary embolism: Secondary | ICD-10-CM

## 2015-11-08 DIAGNOSIS — K639 Disease of intestine, unspecified: Secondary | ICD-10-CM | POA: Diagnosis not present

## 2015-11-08 DIAGNOSIS — I2699 Other pulmonary embolism without acute cor pulmonale: Secondary | ICD-10-CM | POA: Diagnosis present

## 2015-11-08 DIAGNOSIS — E119 Type 2 diabetes mellitus without complications: Secondary | ICD-10-CM | POA: Diagnosis present

## 2015-11-08 DIAGNOSIS — C182 Malignant neoplasm of ascending colon: Secondary | ICD-10-CM | POA: Diagnosis present

## 2015-11-08 LAB — CBC WITH DIFFERENTIAL/PLATELET
BASOS ABS: 0 10*3/uL (ref 0.0–0.1)
Basophils Relative: 0 %
EOS PCT: 0 %
Eosinophils Absolute: 0 10*3/uL (ref 0.0–0.7)
HEMATOCRIT: 30.5 % — AB (ref 39.0–52.0)
Hemoglobin: 9.6 g/dL — ABNORMAL LOW (ref 13.0–17.0)
LYMPHS ABS: 1 10*3/uL (ref 0.7–4.0)
LYMPHS PCT: 10 %
MCH: 23.7 pg — AB (ref 26.0–34.0)
MCHC: 31.5 g/dL (ref 30.0–36.0)
MCV: 75.3 fL — AB (ref 78.0–100.0)
Monocytes Absolute: 0.9 10*3/uL (ref 0.1–1.0)
Monocytes Relative: 9 %
NEUTROS ABS: 8.3 10*3/uL — AB (ref 1.7–7.7)
Neutrophils Relative %: 81 %
PLATELETS: 402 10*3/uL — AB (ref 150–400)
RBC: 4.05 MIL/uL — AB (ref 4.22–5.81)
RDW: 15.9 % — ABNORMAL HIGH (ref 11.5–15.5)
WBC: 10.3 10*3/uL (ref 4.0–10.5)

## 2015-11-08 LAB — COMPREHENSIVE METABOLIC PANEL
ALK PHOS: 104 U/L (ref 38–126)
ALT: 24 U/L (ref 17–63)
AST: 36 U/L (ref 15–41)
Albumin: 2.9 g/dL — ABNORMAL LOW (ref 3.5–5.0)
Anion gap: 9 (ref 5–15)
BILIRUBIN TOTAL: 0.8 mg/dL (ref 0.3–1.2)
BUN: 12 mg/dL (ref 6–20)
CALCIUM: 8.9 mg/dL (ref 8.9–10.3)
CHLORIDE: 105 mmol/L (ref 101–111)
CO2: 22 mmol/L (ref 22–32)
CREATININE: 0.9 mg/dL (ref 0.61–1.24)
Glucose, Bld: 86 mg/dL (ref 65–99)
Potassium: 4.1 mmol/L (ref 3.5–5.1)
Sodium: 136 mmol/L (ref 135–145)
TOTAL PROTEIN: 6.9 g/dL (ref 6.5–8.1)

## 2015-11-08 LAB — APTT: APTT: 31 s (ref 24–36)

## 2015-11-08 LAB — LIPASE, BLOOD: LIPASE: 13 U/L (ref 11–51)

## 2015-11-08 LAB — PROTIME-INR
INR: 1.18
Prothrombin Time: 15.1 seconds (ref 11.4–15.2)

## 2015-11-08 LAB — I-STAT TROPONIN, ED: TROPONIN I, POC: 0.02 ng/mL (ref 0.00–0.08)

## 2015-11-08 LAB — CBG MONITORING, ED: Glucose-Capillary: 90 mg/dL (ref 65–99)

## 2015-11-08 MED ORDER — CHLORHEXIDINE GLUCONATE CLOTH 2 % EX PADS: 6.0000 | MEDICATED_PAD | Freq: Once | CUTANEOUS | Status: DC

## 2015-11-08 MED ORDER — IOPAMIDOL (ISOVUE-370) INJECTION 76%
100.0000 mL | Freq: Once | INTRAVENOUS | Status: AC | PRN
  Administered 2015-11-08: 100 mL via INTRAVENOUS

## 2015-11-08 MED ORDER — DILTIAZEM HCL 25 MG/5ML IV SOLN
10.0000 mg | Freq: Once | INTRAVENOUS | Status: DC
  Filled 2015-11-08: qty 5

## 2015-11-08 MED ORDER — ALVIMOPAN 12 MG PO CAPS: 12.0000 mg | ORAL_CAPSULE | Freq: Once | ORAL | Status: DC

## 2015-11-08 MED ORDER — CEFOTETAN DISODIUM 2 G IJ SOLR: 2.0000 g | INTRAMUSCULAR | Status: DC

## 2015-11-08 MED ORDER — HEPARIN (PORCINE) IN NACL 100-0.45 UNIT/ML-% IJ SOLN
1500.0000 [IU]/h | INTRAMUSCULAR | Status: DC
  Administered 2015-11-08 (×2): 1500 [IU]/h via INTRAVENOUS
  Filled 2015-11-08 (×2): qty 250

## 2015-11-08 MED ORDER — HEPARIN BOLUS VIA INFUSION
3000.0000 [IU] | Freq: Once | INTRAVENOUS | Status: AC
  Administered 2015-11-08: 3000 [IU] via INTRAVENOUS
  Filled 2015-11-08: qty 3000

## 2015-11-08 NOTE — Telephone Encounter (Signed)
Oncology Nurse Navigator Documentation  Oncology Nurse Navigator Flowsheets 11/08/2015  Navigator Location [facility]  Referral date to RadOnc/MedOnc -  Navigator Encounter Type Telephone  Telephone Incoming Call;Symptom Mgt--ascites causing discomfort  Abnormal Finding Date -  Confirmed Diagnosis Date -  Interventions Other--encouraged daughter to call PCP or GI physician who is aware of his condition. Paracentesis would provide the most immediate relief, but needs to be ordered by provider that has already seen him. If he can wait till Friday, we can order it after he is seen by MD to be done that day in [place].  Time Spent with Patient 43  Daughter reported via VM his ascites is causing discomfort and asking for suggestions. Called back and left above instructions on her VM.

## 2015-11-08 NOTE — Telephone Encounter (Signed)
Spoke with HIM dept at [facility] to fax requested records (path, colonoscopy, and CEA)

## 2015-11-08 NOTE — Consult Note (Signed)
Name: Martin Leblanc MRN: [id number] DOB: 01-Apr-1929    ADMISSION DATE:  11/08/2015 CONSULTATION DATE:  11/08/2015  REFERRING MD :  Dr. Eulas Post [facility]  CHIEF COMPLAINT:  SOB  BRIEF PATIENT DESCRIPTION: 82 year old male with recently diagnosed widely metastatic cancer now presenting with pulmonary embolism.  SIGNIFICANT EVENTS  10/18 admit  STUDIES:  Colonoscopy [facility]) 10/9 > Large tumor in cecum, obvious carcinoma. Patho consistent with adenocarcinoma. CT abd/pelvis 10/16 > There is intercostal herniation of the lung parenchyma in right chest wall laterally measures about 6 cm without evidence of acute complication. Progression in size of nodular mass in right lower lobe pleural based measures 4.8 cm suspicious for metastatic disease. There is thickening of distal esophageal wall. Neoplastic process cannot be excluded. Innumerable nodular lesions are noted throughout the liver suspicious for metastatic disease. There is focal thickening of distal gastric wall axial image 32 highly suspicious for a gastric mass. There is abdominal and pelvic ascites. Nodular thickening of the omentum highly suspicious for metastatic disease and omental caking. Irregular thickening of cecal wall suspicious for a mass please see axial image 53. CTA chest 10/18 > Pulmonary embolus noted within both main pulmonary arteries, extending into all lobes of both lungs. Positive for acute PE with CT evidence of right heart strain (RV/LV Ratio = 1.1). RLL 4.9cm mass, innumerable liver mets.    HISTORY OF PRESENT ILLNESS:  82 year old male with past medical history as below, which is significant for diabetes, remote malignant melanoma, hypertension, and COPD (followed by Dr. Lamonte Sakai). Recent admission for GIB with symptomatic anemia, which raised concern for intestinal cancer. He underwent colonoscopy at [facility] 10/9, which found large tumor in cecum and tested positive for adenocarcinoma. Staging CT scan 10/16 unfortunately  demonstrated metastatic cancer of unknown primary in lung, liver, nodular thickening of omentum with ascites, and gastric/esophageal/cecal masses. As far as I can tell he has not yet seen an oncologist. 10/18 he presented to [facility] ED with complaints of SOB x 3 days. Workup positive for PE with R heart strain. PCCM to see in consultation.   PAST MEDICAL HISTORY :   has a past medical history of Diabetes mellitus without complication ([place]); Edema extremities; GERD (gastroesophageal reflux disease); Hypertension; Malignant melanoma ([place]); Shortness of breath dyspnea; and Sight impaired.  has a past surgical history that includes Inguinal hernia repair; Eye surgery (Left); Cystoscopy with biopsy (N/A, 09/18/2015); Cystoscopy w/ retrogrades (Left, 09/18/2015); and Lung surgery. Prior to Admission medications   Medication Sig Start Date End Date Taking? Authorizing Provider  albuterol (PROVENTIL HFA;VENTOLIN HFA) 108 (90 Base) MCG/ACT inhaler Inhale 2 puffs into the lungs every 4 (four) hours as needed for wheezing or shortness of breath. 09/13/15  Yes Collene Gobble, MD  amLODipine-benazepril (LOTREL) 5-10 MG capsule Take 1 capsule by mouth daily. 09/11/15  Yes Jerline Pain, MD  antiseptic oral rinse (BIOTENE) LIQD 15 mLs by Mouth Rinse route as needed for dry mouth.   Yes Historical Provider, MD  atorvastatin (LIPITOR) 20 MG tablet Take 20 mg by mouth daily.   Yes Historical Provider, MD  CIALIS 5 MG tablet Take 5 mg by mouth at bedtime as needed for urinary retention. 09/04/15  Yes Historical Provider, MD  docusate sodium (COLACE) 100 MG capsule Take 100 mg by mouth daily as needed for mild constipation.   Yes Historical Provider, MD  doxazosin (CARDURA) 4 MG tablet Take 4 mg by mouth daily.   Yes Historical Provider, MD  ferrous sulfate 325 (65 FE)  MG tablet Take 1 tablet (325 mg total) by mouth daily with breakfast. 07/26/15  Yes Carly J Rivet, MD  HUMALOG MIX 50/50 KWIKPEN (50-50) 100 UNIT/ML Kwikpen Inject  40-50 Units into the skin See admin instructions. Pt uses 40 units at breakfast and dinner and if BS is high, uses 50 units at bedtime 07/14/15  Yes Historical Provider, MD  Hypromellose (ARTIFICIAL TEARS OP) Place 1 drop into both eyes daily as needed (dry eyes).   Yes Historical Provider, MD  losartan (COZAAR) 100 MG tablet Take 100 mg by mouth daily. 08/29/15  Yes Historical Provider, MD  omeprazole (PRILOSEC) 20 MG capsule Take 20 mg by mouth daily.   Yes Historical Provider, MD  solifenacin (VESICARE) 10 MG tablet Take 10 mg by mouth daily.   Yes Historical Provider, MD  Specialty Vitamins Products (PROSTATE PO) Take 1 capsule by mouth daily.   Yes Historical Provider, MD  ciprofloxacin (CIPRO) 500 MG tablet Take 1 tablet (500 mg total) by mouth 2 (two) times daily. Patient not taking: Reported on 11/08/2015 09/18/15   Raynelle Bring, MD  phenazopyridine (PYRIDIUM) 100 MG tablet Take 1 tablet (100 mg total) by mouth 3 (three) times daily as needed for pain (for burning). Patient not taking: Reported on 11/08/2015 09/18/15   Raynelle Bring, MD  senna-docusate (SENOKOT-S) 8.6-50 MG tablet Take 2 tablets by mouth daily as needed for mild constipation. Patient not taking: Reported on 11/08/2015 07/26/15   Juliet Rude, MD   No Known Allergies  FAMILY HISTORY:  family history includes Diabetes in his maternal grandmother; Emphysema in his father. SOCIAL HISTORY:  reports that he quit smoking about 42 years ago. His smoking use included Cigarettes. He has a 10.00 pack-year smoking history. He has never used smokeless tobacco. He reports that he does not drink alcohol or use drugs.  REVIEW OF SYSTEMS:  Bolds are positive  Constitutional: weight loss, gain, night sweats, Fevers, chills, fatigue .  HEENT: headaches, Sore throat, sneezing, nasal congestion, post nasal drip, Difficulty swallowing, Tooth/dental problems, visual complaints visual changes, ear ache CV:  chest pain, radiates: ,Orthopnea, PND,  swelling in lower extremities, dizziness, palpitations, syncope.  GI  heartburn, indigestion, abdominal pain, nausea, vomiting, diarrhea, change in bowel habits, loss of appetite, bloody stools.  Resp: cough, productive: , hemoptysis, dyspnea, chest pain, pleuritic.  Skin: rash or itching or icterus GU: dysuria, change in color of urine, urgency or frequency. flank pain, hematuria  MS: joint pain or swelling. decreased range of motion  Psych: change in mood or affect. depression or anxiety.  Neuro: difficulty with speech, weakness, numbness, ataxia    SUBJECTIVE:   VITAL SIGNS: Temp:  [98.4 F (36.9 C)] 98.4 F (36.9 C) (10/18 1850) Pulse Rate:  [44-112] 99 (10/18 2130) Resp:  [14-21] 20 (10/18 2130) BP: (108-119)/(65-73) 108/73 (10/18 2000) SpO2:  [85 %-95 %] 95 % (10/18 2130) Weight:  [99.8 kg (220 lb)] 99.8 kg (220 lb) (10/18 1858)  PHYSICAL EXAMINATION: General:  Obese male wild mild WOB Neuro:  Alert, oriented, non-focal HEENT:  [place]/AT, PERRL, no JVD Cardiovascular:  RRR, no MRG Lungs:  Clear Abdomen:  Soft, non-tender, non-distended Musculoskeletal:  No acute deformity or ROM limitation Skin:  Grossly intact   Recent Labs Lab 11/08/15 2000  NA 136  K 4.1  CL 105  CO2 22  BUN 12  CREATININE 0.90  GLUCOSE 86    Recent Labs Lab 11/08/15 2000  HGB 9.6*  HCT 30.5*  WBC 10.3  PLT 402*  Ct Angio Chest Pe W And/or Wo Contrast  Result Date: 11/08/2015 CLINICAL DATA:  Acute onset of worsening shortness of breath. Current history of metastatic cancer within the lung and liver. Initial encounter. EXAM: CT ANGIOGRAPHY CHEST WITH CONTRAST TECHNIQUE: Multidetector CT imaging of the chest was performed using the standard protocol during bolus administration of intravenous contrast. Multiplanar CT image reconstructions and MIPs were obtained to evaluate the vascular anatomy. CONTRAST:  100 mL of Isovue 370 IV contrast COMPARISON:  CT of the abdomen and pelvis from  11/06/2015, and CTA of the chest performed 07/24/2015 FINDINGS: Cardiovascular: Pulmonary embolus is noted within the right main pulmonary artery, extending into all lobes of the right lung, and also within the left main pulmonary artery, extending into all lobes of the left lung. The RV/LV ratio of 1.1 corresponds to right heart strain and concern for submassive pulmonary embolus. Calcification is noted at the aortic and mitral valves. Diffuse coronary artery calcifications are seen. Scattered calcification is seen along the aortic arch and descending thoracic aorta. The great vessels are grossly unremarkable in appearance. Mediastinum/Nodes: A prominent subcarinal node is noted, 1.4 cm in short axis. No pericardial effusion is seen. The thyroid gland is unremarkable in appearance. No axillary lymphadenopathy is appreciated. Lungs/Pleura: Scattered bilateral pulmonary nodules are seen, predominantly at the right lower lobe, the largest of which measures 4.9 cm in size. Underlying trace bilateral pleural effusions are noted, with bibasilar opacities likely reflecting atelectasis. A 5 mm pulmonary nodule is noted at the right upper lobe (image 37 of 92). A pulmonary nodule is also noted at the left upper lobe (image 20 of 92). No pneumothorax is seen. Upper Abdomen: Innumerable hypodense lesions are seen throughout the liver, compatible with metastatic disease. The visualized portions of the spleen are unremarkable. Moderate volume ascites is seen at the upper abdomen. Irregular wall thickening is noted at the patient's hiatal hernia, which could conceivably reflect underlying mass, or intraluminal contents. Musculoskeletal: No acute osseous abnormalities are identified. Mild degenerative change is noted at the lower cervical and upper thoracic spine. The visualized musculature is unremarkable in appearance. Review of the MIP images confirms the above findings. IMPRESSION: 1. Pulmonary embolus noted within both main  pulmonary arteries, extending into all lobes of both lungs. Positive for acute PE with CT evidence of right heart strain (RV/LV Ratio = 1.1) consistent with at least submassive (intermediate risk) PE. The presence of right heart strain has been associated with an increased risk of morbidity and mortality. Please activate Code PE by paging [phone]. 2. 4.9 cm mass at the right lower lobe. Scattered additional pulmonary nodules noted bilaterally, most prominent at the right lower lobe. On correlation with prior studies, this may reflect a bronchogenic primary malignancy with associated metastases. 3. Innumerable metastases noted throughout the liver. 4. Prominent subcarinal node, measuring 1.4 cm in short axis common may reflect metastatic disease. 5. Diffuse coronary artery calcifications seen. 6. Moderate ascites at the upper abdomen. 7. Irregular wall thickening again noted at the hiatal hernia. This could reflect underlying mass, given clinical concern, or simply intraluminal contents. However, it appears to be new from July, whereas the right lower lobe lung mass has gradually increased in size, and thus primary malignancy at the hiatal hernia is considered less likely. Critical Value/emergent results were called by telephone at the time of interpretation on 11/08/2015 at 9:21 pm to Dr. Shirlyn Goltz, who verbally acknowledged these results. Electronically Signed   By: Garald Balding M.D.   On:  11/08/2015 21:53   Dg Chest Port 1 View  Result Date: 11/08/2015 CLINICAL DATA:  Shortness of breath worsening today, history of cancer of the lung, colon and liver, diabetes mellitus, hypertension, melanoma EXAM: PORTABLE CHEST 1 VIEW COMPARISON:  Portable exam 1900 hours compared to 07/24/2015 FINDINGS: Normal heart size, mediastinal contours, and pulmonary vascularity. Atherosclerotic calcification aorta. Again identified peripheral opacity at the lower RIGHT chest question atelectasis versus scarring unchanged.  Small focus of gas at the inferior RIGHT chest projects beyond the costal margin, unchanged since prior chest radiograph but not localized on CT. RIGHT infrahilar opacity corresponding to rounded opacity on prior CT again seen. Remaining lungs clear. No pleural effusion or pneumothorax. IMPRESSION: Chronic opacity at the lateral lower RIGHT chest may represent scarring or chronic atelectasis, less likely chronic infiltrate. Rounded RIGHT infrahilar opacity appears unchanged since prior CT, was of fluid attenuation on prior CT question loculated fluid versus necrotic nodule. Aortic atherosclerosis. Remainder of exam unremarkable. Electronically Signed   By: Lavonia Dana M.D.   On: 11/08/2015 19:41    ASSESSMENT / PLAN:  Bilateral pulmonary emboli. Bilateral pulmonary arteries. Mr Mccloskey is hemodynamically stable and satting well (96%)on 4L [place]. Started on heparin infusion.  - Heparin infusion > transitioned to enoxaparin - Transition to oral anticoagulation, which he will need for foreseeable future. - Echocardiogram to better evaluate RV - Bilateral dopplers > R LE DVT based on prelim report 10/19 am - Supplemental O2 to keep sats > 92% - Not a candidate for thrombolysis of any kind at this point. If worsens would need to be sure no mets to brain before considering EKOS/full dose. His cecal mass would be a relative contraindication  Metastatic Cancer / Pulmonary nodules. Probable mets as opposed to primary lung CA - per primary team. Had planned multidisciplinary care meeting Friday.   Georgann Housekeeper, AGACNP-BC [facility] Pager [phone] or [phone] 11/08/2015 11:23 PM   Attending Note:  I have examined patient, reviewed labs, studies and notes. I have discussed the case with Jaclynn Guarneri, and I agree with the data and plans as amended above.   82 yo man, known to me from office, followed for COPD and an abnormal CT chest 07/2015. He has a hx remote R VATS for  hamartoma, also bladder lesion followed by Urology, biopsied and benign. He had a CT chest 07/2015 that showed R pleural changes and a RLL rounded opacity that was water opacity, suspected to be trapped pleural fluid and scar post VATS.  We had planned to follow this on CT for interval stability. Unfortunately since that time he has been admitted for GIB, found to have a cecal mass. Bx showed adenoCA on 10/9 (unclear if colon or lung markers were assessed on the path based on my read of the [facility] path report). Was planning for Oncology eval in [place] 10/20. CT abd showed metastatic disease on 10/16, enlargement of rounded R basilar nodule as well. Admitted with acute dyspnea and 10/17 CT-PA confirmed bilateral PE, R > L clot burden, with new scattered variably-sized pulmonary nodules. He was started on anticoagulation, has been hemodynamically stable, no refractory hypoxemia or resp distress, no evidence thus far of GIB with his anticoagulation. Doppler US this am 10/19 shows R LE DVT. He does not meet criteria for lysis. He will need MRI or CT brain before even considering it if he were to decline. Agree with standard anticoagulation, enoxaparin to be preferred in setting malignancy. If he had GIB necessitating  d/c anticoag then would need to consider IVC filter. Based on the shape and variable sizes of the pulmonary nodules these are almost certainly metastatic as opposed to primary lung CA.  The large RLL nodule could be approached for needle bx if deemed necessary by oncology to insure path identical to the cecal mass.    Baltazar Apo, MD, PhD 11/09/2015, 9:38 AM [facility] [phone] or if no answer [phone]

## 2015-11-08 NOTE — Telephone Encounter (Signed)
Oncology Nurse Navigator Documentation  Oncology Nurse Navigator Flowsheets 11/08/2015  Navigator Location [facility]  Referral date to RadOnc/MedOnc -  Navigator Encounter Type Telephone  Telephone Incoming Call;Outgoing Call  Abnormal Finding Date -  Confirmed Diagnosis Date -  Barriers/Navigation Needs Coordination of Care--provided fax # for FMLA form to be sent [phone])  Interventions Coordination of Care  Coordination of Care Appts--paracentesis at [facility] 10/20 at 10:00  Acuity Level 2  Time Spent with Patient 30  Daughter called to ask where to fax FMLA forms to. Made daughter aware that Dr. Burr Medico agreed to schedule a paracentesis for Friday at 10 am at [facility]. This was the only time they have open that day.

## 2015-11-08 NOTE — Progress Notes (Signed)
ANTICOAGULATION CONSULT NOTE - Initial Consult  Pharmacy Consult for heparin IV Indication: pulmonary embolus  No Known Allergies  Patient Measurements: Height: 5\' 11"  (180.3 cm) Weight: 220 lb (99.8 kg) IBW/kg (Calculated) : 75.3 Heparin Dosing Weight: 96 kg  Vital Signs: Temp: 98.4 F (36.9 C) (10/18 1850) BP: 108/73 (10/18 2000) Pulse Rate: 104 (10/18 2000)  Labs:  Recent Labs  11/08/15 2000  HGB 9.6*  HCT 30.5*  PLT 402*  CREATININE 0.90    Estimated Creatinine Clearance: 73.5 mL/min (by C-G formula based on SCr of 0.9 mg/dL).   Medical History: Past Medical History:  Diagnosis Date  . Diabetes mellitus without complication (HCC)    Insulin use  . Edema extremities    right greater than left feet.  Marland Kitchen GERD (gastroesophageal reflux disease)   . Hypertension   . Malignant melanoma ([place])    s/p treatment 25 yrs ago-left eye  . Shortness of breath dyspnea    recent had hospital visit 7'17 [facility]- slightly improved.  . Sight impaired    left eye- hx melanoma/radiation    Medications:   (Not in a hospital admission)  Assessment: 79 yoM with PMH DM, aortic stenosis, ocular melanoma and metastatic cancer of unknown primary with malignant ascites presents from home with shortness of breath.  CTa reveals submassive PE. Pharmacy to dose heparin IV for pulmonary embolism. Per MD, patient will likely need thoracentesis tomorrow AM.   Baseline INR, aPTT: pending  Prior anticoagulation: none  Significant events:  Today, 11/08/2015:  CBC: Hgb low, likely d/t malignancy; Plt wnl  No bleeding or infusion issues per nursing  CrCl: 73 ml/min  Goal of Therapy: Heparin level 0.3-0.7 units/ml Monitor platelets by anticoagulation protocol: Yes  Plan:  Heparin 3000 units IV bolus x 1  Heparin 1500 units/hr IV infusion  Check heparin level 8 hrs after start  Expect to hold heparin for 2-4 hrs prior to procedure tomorrow AM.  Bolus should not be a concern by  then due to short half-life.  Daily CBC, daily heparin level once stable  Monitor for signs of bleeding or thrombosis   Reuel Boom, PharmD Pager: [phone] 11/08/2015, 9:23 PM

## 2015-11-08 NOTE — ED Notes (Signed)
EKG given to Carter,MD., Hospitalist for review.

## 2015-11-08 NOTE — ED Notes (Signed)
Bed: [id number] Expected date:  Expected time:  Means of arrival:  Comments: EMS-ascites

## 2015-11-08 NOTE — ED Triage Notes (Signed)
Patient is from home and transported via [facility]. Patient is experiencing shortness of breath that got worse today. Patient has liver, colone, and lung cancer. He is scheduled for an paracentesis on Friday.

## 2015-11-08 NOTE — ED Provider Notes (Signed)
[facility] DEPT Provider Note   CSN: [id number] Arrival date & time: 11/08/15  1836     History   Chief Complaint Chief Complaint  Patient presents with  . Shortness of Breath    HPI Martin Leblanc is a 82 y.o. male.  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 His shortness of breath became persistent today and continued despite rest, in addition to malaise and dizziness, which prompted his visit to the ER. His loss of appetite and initially subtle abdominal distension prompted him to undergo a colonscopy in the recent past and a cecal mass was found. Subsequently a CT A/P on 11/06/15 revealed metastatic cancer of unknown primary in lung, liver, nodular thickening of omentum with ascites, and gastric/esophageal/cecal masses. He also endorses longstanding dry cough, constipation, and loss of appetite. He denies N/V/D, pain, fevers/chills, weight loss.    The history is provided by the patient and a relative. No language interpreter was used.    Past Medical History:  Diagnosis Date  . Diabetes mellitus without complication (HCC)    Insulin use  . Edema extremities    right greater than left feet.  Marland Kitchen GERD (gastroesophageal reflux disease)   . Hypertension   . Malignant melanoma ([place])    s/p treatment 25 yrs ago-left eye  . Shortness of breath dyspnea    recent had hospital visit 7'17 [facility]- slightly improved.  . Sight impaired    left eye- hx melanoma/radiation    Patient Active Problem List   Diagnosis Date Noted  . Ascites 11/08/2015  . Abnormal CT scan, chest 09/12/2015  . Symptomatic anemia   . Iron deficiency anemia   . Exertional shortness of breath 07/24/2015  . Dyspnea 07/24/2015    Past Surgical History:  Procedure Laterality Date  . CYSTOSCOPY W/ RETROGRADES Left 09/18/2015   Procedure: CYSTOSCOPY WITH  RETROGRADE PYELOGRAM;  Surgeon: Raynelle Bring, MD;  Location: [facility];  Service: Urology;  Laterality: Left;  . CYSTOSCOPY WITH BIOPSY N/A 09/18/2015   Procedure: CYSTOSCOPY WITH BLADDER BIOPSY;  Surgeon: Raynelle Bring, MD;  Location: [facility];  Service: Urology;  Laterality: N/A;  . EYE SURGERY Left    laser eye surgery, after radiation for melanoma tx  . INGUINAL HERNIA REPAIR     early 2000s  . LUNG SURGERY         Home Medications    Prior to Admission medications   Medication Sig Start Date End Date Taking? Authorizing Provider  albuterol (PROVENTIL HFA;VENTOLIN HFA) 108 (90 Base) MCG/ACT inhaler Inhale 2 puffs into the lungs every 4 (four) hours as needed for wheezing or shortness of breath. 09/13/15  Yes Collene Gobble, MD  amLODipine-benazepril (LOTREL) 5-10 MG capsule Take 1 capsule by mouth daily. 09/11/15  Yes Jerline Pain, MD  antiseptic oral rinse (BIOTENE) LIQD 15 mLs by Mouth Rinse route as needed for dry mouth.   Yes Historical Provider, MD  atorvastatin (LIPITOR) 20 MG tablet Take 20 mg by mouth daily.   Yes Historical Provider, MD  doxazosin (CARDURA) 4 MG tablet Take 4 mg by mouth daily.   Yes Historical Provider, MD  ferrous sulfate 325 (65 FE) MG tablet Take 1 tablet (325 mg total) by mouth daily with breakfast. 07/26/15  Yes Carly J Rivet, MD  HUMALOG MIX 50/50 KWIKPEN (50-50) 100 UNIT/ML Kwikpen Inject 40-50 Units  into the skin See admin instructions. Pt uses 40 units at breakfast and dinner and if BS is high, uses 50 units at bedtime 07/14/15  Yes Historical Provider, MD  Hypromellose (ARTIFICIAL TEARS OP) Place 1 drop into both eyes daily as needed (dry eyes).   Yes Historical Provider, MD  omeprazole (PRILOSEC) 20 MG capsule Take 20 mg by mouth daily.   Yes Historical Provider, MD  solifenacin (VESICARE) 10 MG tablet Take 10 mg by mouth daily.   Yes Historical Provider, MD  Specialty Vitamins Products (PROSTATE PO) Take 1 capsule by mouth daily.   Yes Historical Provider,  MD  ciprofloxacin (CIPRO) 500 MG tablet Take 1 tablet (500 mg total) by mouth 2 (two) times daily. Patient not taking: Reported on 11/08/2015 09/18/15   Raynelle Bring, MD  phenazopyridine (PYRIDIUM) 100 MG tablet Take 1 tablet (100 mg total) by mouth 3 (three) times daily as needed for pain (for burning). Patient not taking: Reported on 11/08/2015 09/18/15   Raynelle Bring, MD  senna-docusate (SENOKOT-S) 8.6-50 MG tablet Take 2 tablets by mouth daily as needed for mild constipation. Patient not taking: Reported on 11/08/2015 07/26/15   Juliet Rude, MD    Family History Family History  Problem Relation Age of Onset  . Diabetes Maternal Grandmother   . Emphysema Father     Social History Social History  Substance Use Topics  . Smoking status: Former Smoker    Packs/day: 0.50    Years: 20.00    Types: Cigarettes    Quit date: 09/06/1973  . Smokeless tobacco: Never Used     Comment: quit 45 years ago  . Alcohol use No     Allergies   Review of patient's allergies indicates no known allergies.   Review of Systems Review of Systems  Constitutional: Positive for activity change and appetite change. Negative for chills, diaphoresis, fatigue, fever and unexpected weight change.  Respiratory: Positive for cough and shortness of breath. Negative for chest tightness and wheezing.   Cardiovascular: Negative for chest pain and palpitations.  Gastrointestinal: Positive for abdominal distention and constipation. Negative for abdominal pain, diarrhea, nausea and vomiting.  Genitourinary: Negative for decreased urine volume, difficulty urinating and dysuria.  Neurological: Positive for dizziness and light-headedness.  All other systems reviewed and are negative.    Physical Exam Updated Vital Signs BP 116/65   Pulse 112   Temp 98.4 F (36.9 C)   Resp 20   Ht 5\' 11"  (1.803 m)   Wt 99.8 kg   SpO2 95%   BMI 30.68 kg/m   Physical Exam  Constitutional: He is oriented to person,  place, and time. He appears well-developed and well-nourished. He appears distressed.  HENT:  Head: Normocephalic and atraumatic.  Mouth/Throat: Oropharynx is clear and moist.  Eyes: EOM are normal. Pupils are equal, round, and reactive to light.  Neck: Normal range of motion. Neck supple.  Cardiovascular: Intact distal pulses.  Exam reveals no gallop and no friction rub.   Murmur (systolic) heard. Tachycardic rate, irregular rhythm  Pulmonary/Chest: Breath sounds normal. He is in respiratory distress. He has no wheezes. He has no rales.  Mildly tachypneic  Abdominal: He exhibits distension. He exhibits no mass. There is no tenderness. There is no rebound and no guarding. No hernia.  Tight, distended abdomen with decreased bowel sounds  Musculoskeletal: Normal range of motion. He exhibits edema (trace pitting edema of BLEs to mid calves). He exhibits no tenderness or deformity.  Neurological: He is alert and  oriented to person, place, and time. No cranial nerve deficit.  Skin: Skin is warm and dry. Capillary refill takes less than 2 seconds. No rash noted. He is not diaphoretic. No erythema. No pallor.  Psychiatric: He has a normal mood and affect. His behavior is normal. Thought content normal.  Nursing note and vitals reviewed.    ED Treatments / Results  Labs (all labs ordered are listed, but only abnormal results are displayed) Labs Reviewed  CBC WITH DIFFERENTIAL/PLATELET  COMPREHENSIVE METABOLIC PANEL  LIPASE, BLOOD  I-STAT TROPOININ, ED    EKG  EKG Interpretation  Date/Time:  Wednesday November 08 2015 18:48:05 EDT Ventricular Rate:  107 PR Interval:    QRS Duration: 79 QT Interval:  362 QTC Calculation: 483 R Axis:   67 Text Interpretation:  afib  Atrial premature complexes Nonspecific T abnormalities, lateral leads Borderline prolonged QT interval No significant change since last tracing Confirmed by YAO  MD, DAVID ([id number]) on 11/08/2015 6:56:14 PM        Radiology Dg Chest Port 1 View  Result Date: 11/08/2015 CLINICAL DATA:  Shortness of breath worsening today, history of cancer of the lung, colon and liver, diabetes mellitus, hypertension, melanoma EXAM: PORTABLE CHEST 1 VIEW COMPARISON:  Portable exam 1900 hours compared to 07/24/2015 FINDINGS: Normal heart size, mediastinal contours, and pulmonary vascularity. Atherosclerotic calcification aorta. Again identified peripheral opacity at the lower RIGHT chest question atelectasis versus scarring unchanged. Small focus of gas at the inferior RIGHT chest projects beyond the costal margin, unchanged since prior chest radiograph but not localized on CT. RIGHT infrahilar opacity corresponding to rounded opacity on prior CT again seen. Remaining lungs clear. No pleural effusion or pneumothorax. IMPRESSION: Chronic opacity at the lateral lower RIGHT chest may represent scarring or chronic atelectasis, less likely chronic infiltrate. Rounded RIGHT infrahilar opacity appears unchanged since prior CT, was of fluid attenuation on prior CT question loculated fluid versus necrotic nodule. Aortic atherosclerosis. Remainder of exam unremarkable. Electronically Signed   By: Lavonia Dana M.D.   On: 11/08/2015 19:41    Procedures Procedures (including critical care time)  Medications Ordered in ED Medications  diltiazem (CARDIZEM) injection 10 mg (not administered)     Initial Impression / Assessment and Plan / ED Course  I have reviewed the triage vital signs and the nursing notes.  Pertinent labs & imaging results that were available during my care of the patient were reviewed by me and considered in my medical decision making (see chart for details).  Clinical Course   Mr. Boike is an 82 yo man with PMH DM1, GERD, recently-discovered metastatic cancer of unknown origin who presents for progressive shortness of breath and abdominal distension from malignant ascites. Patient with new oxygen requirement  and mildly hypotensive with new onset afib/RVR. Stable on 2L [place]. Will obtain CTA chest to evaluate for VTE/PE, CXR, EKG, troponin, CBC, CMP. Admit and likely therapeutic/diagnostic paracentesis via IR tomorrow.   CTA revealed bilateral pulmonary emboli, will start Heparin gtt  Final Clinical Impressions(s) / ED Diagnoses   Final diagnoses:  None    New Prescriptions New Prescriptions   No medications on file     Asencion Partridge, MD 11/09/15 [id number]    Drenda Freeze, MD 11/10/15 [id number]

## 2015-11-08 NOTE — H&P (Signed)
History and Physical    Martin Leblanc [id number] DOB: 06-24-1929 DOA: 11/08/2015  PCP: Sheela Stack, MD  Cardiology: Marlou Porch Pulmonary: Len Blalock Surg: Ninfa Linden Oncology pending--Dr. Burr Medico  Patient coming from: Home  Chief Complaint: Progressive shortness of breath  HPI: Martin Leblanc is a 82 y.o. gentleman with a history melanoma affecting his left eye, HTN, DM, and GERD who presents to the ED accompanied by two daughters and his son-in-law.  He was brought in by EMS for progressive SOB.  He has an unfortunate back story that began in July with an admission to [facility] for chest pain and shortness of breath.  He was found to have iron deficiency anemia then, and his symptoms were attributed to this.  A CTA of the chest at that time was negative for PE but showed a pulmonary nodule, and follow-up imaging was recommended.  Echo in July also showed grade II diastolic dysfunction, and mod to severe AS with a valve area of 0.95cm2.  He ultimately followed up in clinic with both cardiology and pulmonary, but no alternative causes for his symptoms were identified.  He saw his urologist in late August for LUTS and a bladder mass was identified; however, biopsies from this lesion were reportedly benign.  He continued to have shortness of breath, and decreased appetite (though he denies significant weight loss).  He requested colonoscopy by his GI provider, which revealed a mass at the cecum.  He was referred to Dr. Ninfa Linden for possible hemicolectomy, but repeat imaging of his abdomen and pelvis on 10/16 showed progression in size of the RLL lung mass concerning for metastatic disease, innumerable liver lesions concerning for mets, focal thickening of the gastric wall, irregular thickening of the cecal wall, and ascites.  He was no longer considered a surgical candidate and was referred to oncology, with pending clinic appointment on Friday with Dr. Truitt Merle.  However, on the date of presentation to our ED,  he became acutely and profoundly more short of breath, prompting a 911 call by his daughter.  He was found to have mild hypoxia with new O2 requirement.  He was also found to be in atrial fibrillation with RVR.  He also admits to increased abdominal girth and pain in his legs for the past 2-3 days.  ED Course: Unfortunately, CTA of the chest tonight shows submassive PE with involvement of both main pulmonary arteries.  He also has 4.9 cm RLL mass with additional scattered pulmonary nodules bilaterally.  Innumerable liver lesions are again identified.  Moderate ascites present in the upper abdomen.  There is also irregular wall thickening at his hiatal hernia, concerning for underlying mass.  The patient was started on IV heparin per protocol.  I asked the ED attending to consult critical care.  The patient's atrial fibrillation spontaneously resolved without specific intervention.  Hospitalist asked to admit.  Review of Systems: CONSTIPATION.  As per HPI otherwise 10 point review of systems negative.    Past Medical History:  Diagnosis Date  . Diabetes mellitus without complication (HCC)    Insulin use  . Edema extremities    right greater than left feet.  Marland Kitchen GERD (gastroesophageal reflux disease)   . Hypertension   . Malignant melanoma ([place])    s/p treatment 25 yrs ago-left eye  . Shortness of breath dyspnea    recent had hospital visit 7'17 [facility]- slightly improved.  . Sight impaired    left eye- hx melanoma/radiation    Past Surgical History:  Procedure Laterality Date  . CYSTOSCOPY W/ RETROGRADES Left 09/18/2015   Procedure: CYSTOSCOPY WITH RETROGRADE PYELOGRAM;  Surgeon: Raynelle Bring, MD;  Location: [facility];  Service: Urology;  Laterality: Left;  . CYSTOSCOPY WITH BIOPSY N/A 09/18/2015   Procedure: CYSTOSCOPY WITH BLADDER BIOPSY;  Surgeon: Raynelle Bring, MD;  Location: [facility];  Service: Urology;  Laterality: N/A;  . EYE SURGERY Left    laser eye surgery, after radiation for melanoma  tx  . INGUINAL HERNIA REPAIR     early 2000s  . LUNG SURGERY       reports that he quit smoking about 42 years ago. His smoking use included Cigarettes. He has a 10.00 pack-year smoking history. He has never used smokeless tobacco. He reports that he does not drink alcohol or use drugs. He is married but separated from his wife.  Prior to becoming ill, he has been living independently.  He still drives.  Independent with ADLs.  No Known Allergies  Family History  Problem Relation Age of Onset  . Diabetes Maternal Grandmother   . Emphysema Father      Prior to Admission medications   Medication Sig Start Date End Date Taking? Authorizing Provider  albuterol (PROVENTIL HFA;VENTOLIN HFA) 108 (90 Base) MCG/ACT inhaler Inhale 2 puffs into the lungs every 4 (four) hours as needed for wheezing or shortness of breath. 09/13/15  Yes Collene Gobble, MD  amLODipine-benazepril (LOTREL) 5-10 MG capsule Take 1 capsule by mouth daily. 09/11/15  Yes Jerline Pain, MD  antiseptic oral rinse (BIOTENE) LIQD 15 mLs by Mouth Rinse route as needed for dry mouth.   Yes Historical Provider, MD  atorvastatin (LIPITOR) 20 MG tablet Take 20 mg by mouth daily.   Yes Historical Provider, MD  CIALIS 5 MG tablet Take 5 mg by mouth at bedtime as needed for urinary retention. 09/04/15  Yes Historical Provider, MD  docusate sodium (COLACE) 100 MG capsule Take 100 mg by mouth daily as needed for mild constipation.   Yes Historical Provider, MD  doxazosin (CARDURA) 4 MG tablet Take 4 mg by mouth daily.   Yes Historical Provider, MD  ferrous sulfate 325 (65 FE) MG tablet Take 1 tablet (325 mg total) by mouth daily with breakfast. 07/26/15  Yes Carly J Rivet, MD  HUMALOG MIX 50/50 KWIKPEN (50-50) 100 UNIT/ML Kwikpen Inject 40-50 Units into the skin See admin instructions. Pt uses 40 units at breakfast and dinner and if BS is high, uses 50 units at bedtime 07/14/15  Yes Historical Provider, MD  Hypromellose (ARTIFICIAL TEARS OP)  Place 1 drop into both eyes daily as needed (dry eyes).   Yes Historical Provider, MD  losartan (COZAAR) 100 MG tablet Take 100 mg by mouth daily. 08/29/15  Yes Historical Provider, MD  omeprazole (PRILOSEC) 20 MG capsule Take 20 mg by mouth daily.   Yes Historical Provider, MD  solifenacin (VESICARE) 10 MG tablet Take 10 mg by mouth daily.   Yes Historical Provider, MD  Specialty Vitamins Products (PROSTATE PO) Take 1 capsule by mouth daily.   Yes Historical Provider, MD    Physical Exam: Vitals:   11/08/15 2000 11/08/15 2130 11/08/15 2200 11/08/15 2300  BP: 108/73  124/68 120/70  Pulse: 104 99 101 97  Resp: 16 20 20 23   Temp:      SpO2: 94% 95% 93% 95%  Weight:      Height:          Constitutional: NAD, calm, comfortable Vitals:   11/08/15  2000 11/08/15 2130 11/08/15 2200 11/08/15 2300  BP: 108/73  124/68 120/70  Pulse: 104 99 101 97  Resp: 16 20 20 23   Temp:      SpO2: 94% 95% 93% 95%  Weight:      Height:       Eyes: Surgical pupil on the left, right pupil reactive, lids and conjunctiva are normal ENMT: Mucous membranes are moist. Posterior pharynx clear of any exudate or lesions. Normal dentition.  Neck: normal appearance, supple Respiratory: clear to auscultation bilaterally, no wheezing, no crackles. Normal respiratory effort. No accessory muscle use.  Cardiovascular: Normal rate, regular rhythm, no murmurs / rubs / gallops. No extremity edema. 2+ pedal pulses. GI: abdomen is distended but not firm.  He is nontender.  Bowel sounds are present. Musculoskeletal:  No joint deformity in upper and lower extremities. Good ROM, no contractures. Normal muscle tone. No calf tenderness.  No pain in either calf with flexion of his feet.  No palpable cords. Skin: no rashes, warm and dry Neurologic: No focal deficits. Psychiatric: Normal judgment and insight. Alert and oriented x 3. Normal mood.     Labs on Admission: I have personally reviewed following labs and imaging  studies  CBC:  Recent Labs Lab 11/08/15 2000  WBC 10.3  NEUTROABS 8.3*  HGB 9.6*  HCT 30.5*  MCV 75.3*  PLT [id number]*   Basic Metabolic Panel:  Recent Labs Lab 11/08/15 2000  NA 136  K 4.1  CL 105  CO2 22  GLUCOSE 86  BUN 12  CREATININE 0.90  CALCIUM 8.9   GFR: Estimated Creatinine Clearance: 73.5 mL/min (by C-G formula based on SCr of 0.9 mg/dL). Liver Function Tests:  Recent Labs Lab 11/08/15 2000  AST 36  ALT 24  ALKPHOS 104  BILITOT 0.8  PROT 6.9  ALBUMIN 2.9*    Recent Labs Lab 11/08/15 2000  LIPASE 13   Coagulation Profile:  Recent Labs Lab 11/08/15 1953  INR 1.18   CBG:  Recent Labs Lab 11/08/15 2009  GLUCAP 90    Radiological Exams on Admission: Ct Angio Chest Pe W And/or Wo Contrast  Result Date: 11/08/2015 CLINICAL DATA:  Acute onset of worsening shortness of breath. Current history of metastatic cancer within the lung and liver. Initial encounter. EXAM: CT ANGIOGRAPHY CHEST WITH CONTRAST TECHNIQUE: Multidetector CT imaging of the chest was performed using the standard protocol during bolus administration of intravenous contrast. Multiplanar CT image reconstructions and MIPs were obtained to evaluate the vascular anatomy. CONTRAST:  100 mL of Isovue 370 IV contrast COMPARISON:  CT of the abdomen and pelvis from 11/06/2015, and CTA of the chest performed 07/24/2015 FINDINGS: Cardiovascular: Pulmonary embolus is noted within the right main pulmonary artery, extending into all lobes of the right lung, and also within the left main pulmonary artery, extending into all lobes of the left lung. The RV/LV ratio of 1.1 corresponds to right heart strain and concern for submassive pulmonary embolus. Calcification is noted at the aortic and mitral valves. Diffuse coronary artery calcifications are seen. Scattered calcification is seen along the aortic arch and descending thoracic aorta. The great vessels are grossly unremarkable in appearance.  Mediastinum/Nodes: A prominent subcarinal node is noted, 1.4 cm in short axis. No pericardial effusion is seen. The thyroid gland is unremarkable in appearance. No axillary lymphadenopathy is appreciated. Lungs/Pleura: Scattered bilateral pulmonary nodules are seen, predominantly at the right lower lobe, the largest of which measures 4.9 cm in size. Underlying trace bilateral pleural effusions are  noted, with bibasilar opacities likely reflecting atelectasis. A 5 mm pulmonary nodule is noted at the right upper lobe (image 37 of 92). A pulmonary nodule is also noted at the left upper lobe (image 20 of 92). No pneumothorax is seen. Upper Abdomen: Innumerable hypodense lesions are seen throughout the liver, compatible with metastatic disease. The visualized portions of the spleen are unremarkable. Moderate volume ascites is seen at the upper abdomen. Irregular wall thickening is noted at the patient's hiatal hernia, which could conceivably reflect underlying mass, or intraluminal contents. Musculoskeletal: No acute osseous abnormalities are identified. Mild degenerative change is noted at the lower cervical and upper thoracic spine. The visualized musculature is unremarkable in appearance. Review of the MIP images confirms the above findings. IMPRESSION: 1. Pulmonary embolus noted within both main pulmonary arteries, extending into all lobes of both lungs. Positive for acute PE with CT evidence of right heart strain (RV/LV Ratio = 1.1) consistent with at least submassive (intermediate risk) PE. The presence of right heart strain has been associated with an increased risk of morbidity and mortality. Please activate Code PE by paging [phone]. 2. 4.9 cm mass at the right lower lobe. Scattered additional pulmonary nodules noted bilaterally, most prominent at the right lower lobe. On correlation with prior studies, this may reflect a bronchogenic primary malignancy with associated metastases. 3. Innumerable metastases  noted throughout the liver. 4. Prominent subcarinal node, measuring 1.4 cm in short axis common may reflect metastatic disease. 5. Diffuse coronary artery calcifications seen. 6. Moderate ascites at the upper abdomen. 7. Irregular wall thickening again noted at the hiatal hernia. This could reflect underlying mass, given clinical concern, or simply intraluminal contents. However, it appears to be new from July, whereas the right lower lobe lung mass has gradually increased in size, and thus primary malignancy at the hiatal hernia is considered less likely. Critical Value/emergent results were called by telephone at the time of interpretation on 11/08/2015 at 9:21 pm to Dr. Shirlyn Goltz, who verbally acknowledged these results. Electronically Signed   By: Garald Balding M.D.   On: 11/08/2015 21:53   Dg Chest Port 1 View  Result Date: 11/08/2015 CLINICAL DATA:  Shortness of breath worsening today, history of cancer of the lung, colon and liver, diabetes mellitus, hypertension, melanoma EXAM: PORTABLE CHEST 1 VIEW COMPARISON:  Portable exam 1900 hours compared to 07/24/2015 FINDINGS: Normal heart size, mediastinal contours, and pulmonary vascularity. Atherosclerotic calcification aorta. Again identified peripheral opacity at the lower RIGHT chest question atelectasis versus scarring unchanged. Small focus of gas at the inferior RIGHT chest projects beyond the costal margin, unchanged since prior chest radiograph but not localized on CT. RIGHT infrahilar opacity corresponding to rounded opacity on prior CT again seen. Remaining lungs clear. No pleural effusion or pneumothorax. IMPRESSION: Chronic opacity at the lateral lower RIGHT chest may represent scarring or chronic atelectasis, less likely chronic infiltrate. Rounded RIGHT infrahilar opacity appears unchanged since prior CT, was of fluid attenuation on prior CT question loculated fluid versus necrotic nodule. Aortic atherosclerosis. Remainder of exam  unremarkable. Electronically Signed   By: Lavonia Dana M.D.   On: 11/08/2015 19:41    EKG: Independently reviewed. Afib with RVR HR 130's upon arrival.  Repeat EKG confirmed conversion to NSR.  Assessment/Plan Principal Problem:   Pulmonary embolus (HCC) Active Problems:   Dyspnea   Ascites   Atrial fibrillation with RVR (HCC)   Lung mass   Mass of cecum   Liver metastases (HCC)   Acute respiratory failure  with hypoxia ([place])   Aortic stenosis   PE (pulmonary thromboembolism) (HCC)      Submassive PE with acute hypoxia and hemodynamic instability --Admit to stepdown unit --PCCM consult requested at time of admission --IV heparin per protocol --Echo in the AM --Serial troponin --Lower extremity ultrasound in the AM to assess for residual clot burden --Strict bedrest for now --Supplemental oxygen as needed  Paroxysmal a fib, CHADS-Vasc 4 --Likely provoked by hypoxia and acute PE --Anticoagulated with IV heparin for PE --Check TFTs, mag level --Echo already indicated to assess for right heart strain  Metastatic cancer, ?colon primary --Consider oncology consulti n the AM  Ascites --I really think the PE is the biggest cause of his shortness of breath but family was already anticipating outpatient paracentesis.  Can still consider doing diagnostic paracentesis when appropriate (would have to suspend heparin drip).  Fluid cytology may help identify primary tumor.  Defer to oncology.  HTN --Continue ARB only for now due to relative hypotension present on admission  GERD --PPI  History of BPH --Continue home meds  DM --Low carb diet, SSI   DVT prophylaxis: FULL anticoagulation with heparin for acute PE Code Status: FULL Family Communication: Two daughters and son-in-law present at bedside in the ED at time of admission Disposition Plan: To be determined Consults called: Critical Care (requested at time of admission, ED attending should have called).  Will need  oncology as well. Admission status: Inpatient, stepdown unit, telemetry   TIME SPENT: 70 minutes   Eber Jones MD [facility] Pager [phone]  If 7PM-7AM, please contact night-coverage [email or web address] Password [email or web address]  11/08/2015, 11:58 PM

## 2015-11-09 ENCOUNTER — Encounter: Payer: Self-pay | Admitting: *Deleted

## 2015-11-09 ENCOUNTER — Inpatient Hospital Stay (HOSPITAL_COMMUNITY): Payer: Medicare Other

## 2015-11-09 DIAGNOSIS — R0602 Shortness of breath: Secondary | ICD-10-CM

## 2015-11-09 DIAGNOSIS — I2699 Other pulmonary embolism without acute cor pulmonale: Secondary | ICD-10-CM

## 2015-11-09 DIAGNOSIS — M7989 Other specified soft tissue disorders: Secondary | ICD-10-CM

## 2015-11-09 DIAGNOSIS — C786 Secondary malignant neoplasm of retroperitoneum and peritoneum: Secondary | ICD-10-CM

## 2015-11-09 DIAGNOSIS — I4891 Unspecified atrial fibrillation: Secondary | ICD-10-CM

## 2015-11-09 DIAGNOSIS — R0600 Dyspnea, unspecified: Secondary | ICD-10-CM

## 2015-11-09 DIAGNOSIS — J9601 Acute respiratory failure with hypoxia: Secondary | ICD-10-CM

## 2015-11-09 DIAGNOSIS — I350 Nonrheumatic aortic (valve) stenosis: Secondary | ICD-10-CM

## 2015-11-09 DIAGNOSIS — R918 Other nonspecific abnormal finding of lung field: Secondary | ICD-10-CM

## 2015-11-09 DIAGNOSIS — K639 Disease of intestine, unspecified: Secondary | ICD-10-CM

## 2015-11-09 DIAGNOSIS — C189 Malignant neoplasm of colon, unspecified: Secondary | ICD-10-CM

## 2015-11-09 DIAGNOSIS — C787 Secondary malignant neoplasm of liver and intrahepatic bile duct: Secondary | ICD-10-CM

## 2015-11-09 DIAGNOSIS — R188 Other ascites: Secondary | ICD-10-CM

## 2015-11-09 DIAGNOSIS — R5383 Other fatigue: Secondary | ICD-10-CM

## 2015-11-09 DIAGNOSIS — Z86711 Personal history of pulmonary embolism: Secondary | ICD-10-CM

## 2015-11-09 LAB — BODY FLUID CELL COUNT WITH DIFFERENTIAL
Eos, Fluid: 1 %
LYMPHS FL: 24 %
Monocyte-Macrophage-Serous Fluid: 66 % (ref 50–90)
Neutrophil Count, Fluid: 9 % (ref 0–25)
Total Nucleated Cell Count, Fluid: 958 cu mm (ref 0–1000)

## 2015-11-09 LAB — GLUCOSE, CAPILLARY
GLUCOSE-CAPILLARY: 144 mg/dL — AB (ref 65–99)
Glucose-Capillary: 164 mg/dL — ABNORMAL HIGH (ref 65–99)
Glucose-Capillary: 234 mg/dL — ABNORMAL HIGH (ref 65–99)
Glucose-Capillary: 164 mg/dL — ABNORMAL HIGH (ref 65–99)

## 2015-11-09 LAB — BASIC METABOLIC PANEL
ANION GAP: 7 (ref 5–15)
BUN: 11 mg/dL (ref 6–20)
CO2: 23 mmol/L (ref 22–32)
Calcium: 8.9 mg/dL (ref 8.9–10.3)
Chloride: 105 mmol/L (ref 101–111)
Creatinine, Ser: 0.9 mg/dL (ref 0.61–1.24)
Glucose, Bld: 118 mg/dL — ABNORMAL HIGH (ref 65–99)
POTASSIUM: 3.4 mmol/L — AB (ref 3.5–5.1)
SODIUM: 135 mmol/L (ref 135–145)

## 2015-11-09 LAB — CBG MONITORING, ED: Glucose-Capillary: 119 mg/dL — ABNORMAL HIGH (ref 65–99)

## 2015-11-09 LAB — CBC
HEMATOCRIT: 27.7 % — AB (ref 39.0–52.0)
Hemoglobin: 8.8 g/dL — ABNORMAL LOW (ref 13.0–17.0)
MCH: 23.7 pg — ABNORMAL LOW (ref 26.0–34.0)
MCHC: 31.8 g/dL (ref 30.0–36.0)
MCV: 74.7 fL — AB (ref 78.0–100.0)
Platelets: 382 10*3/uL (ref 150–400)
RBC: 3.71 MIL/uL — ABNORMAL LOW (ref 4.22–5.81)
RDW: 15.8 % — AB (ref 11.5–15.5)
WBC: 8.6 10*3/uL (ref 4.0–10.5)

## 2015-11-09 LAB — T4, FREE: FREE T4: 1.47 ng/dL — AB (ref 0.61–1.12)

## 2015-11-09 LAB — ALBUMIN, FLUID (OTHER): ALBUMIN FL: 2.3 g/dL

## 2015-11-09 LAB — TSH: TSH: 2.679 u[IU]/mL (ref 0.350–4.500)

## 2015-11-09 LAB — ECHOCARDIOGRAM COMPLETE
HEIGHTINCHES: 71 in
WEIGHTICAEL: 3562.63 [oz_av]

## 2015-11-09 LAB — HEPARIN LEVEL (UNFRACTIONATED): Heparin Unfractionated: 0.12 IU/mL — ABNORMAL LOW (ref 0.30–0.70)

## 2015-11-09 LAB — MAGNESIUM: Magnesium: 1.7 mg/dL (ref 1.7–2.4)

## 2015-11-09 LAB — MRSA PCR SCREENING: MRSA by PCR: NEGATIVE

## 2015-11-09 LAB — TROPONIN I
TROPONIN I: 0.03 ng/mL — AB (ref <0.03)
TROPONIN I: 0.03 ng/mL — AB (ref <0.03)
Troponin I: 0.03 ng/mL (ref <0.03)

## 2015-11-09 MED ORDER — SODIUM CHLORIDE 0.9 % IV SOLN
510.0000 mg | Freq: Once | INTRAVENOUS | Status: AC
  Administered 2015-11-09: 510 mg via INTRAVENOUS
  Filled 2015-11-09: qty 17

## 2015-11-09 MED ORDER — ONDANSETRON HCL 4 MG/2ML IJ SOLN: 4.0000 mg | Freq: Four times a day (QID) | INTRAMUSCULAR | Status: DC | PRN

## 2015-11-09 MED ORDER — TADALAFIL 5 MG PO TABS: 5.0000 mg | ORAL_TABLET | Freq: Every evening | ORAL | Status: DC | PRN

## 2015-11-09 MED ORDER — FERROUS SULFATE 325 (65 FE) MG PO TABS
325.0000 mg | ORAL_TABLET | Freq: Two times a day (BID) | ORAL | Status: DC
  Administered 2015-11-09 – 2015-11-16 (×12): 325 mg via ORAL
  Filled 2015-11-09 (×14): qty 1

## 2015-11-09 MED ORDER — ACETAMINOPHEN 650 MG RE SUPP: 650.0000 mg | Freq: Four times a day (QID) | RECTAL | Status: DC | PRN

## 2015-11-09 MED ORDER — MAGNESIUM SULFATE 2 GM/50ML IV SOLN
2.0000 g | Freq: Once | INTRAVENOUS | Status: AC
  Administered 2015-11-09: 2 g via INTRAVENOUS
  Filled 2015-11-09: qty 50

## 2015-11-09 MED ORDER — SODIUM CHLORIDE 0.9% FLUSH
3.0000 mL | Freq: Two times a day (BID) | INTRAVENOUS | Status: DC
  Administered 2015-11-09 – 2015-11-15 (×11): 3 mL via INTRAVENOUS

## 2015-11-09 MED ORDER — INSULIN ASPART 100 UNIT/ML ~~LOC~~ SOLN
0.0000 [IU] | Freq: Three times a day (TID) | SUBCUTANEOUS | Status: DC
  Administered 2015-11-09: 3 [IU] via SUBCUTANEOUS
  Administered 2015-11-09: 5 [IU] via SUBCUTANEOUS
  Administered 2015-11-09 – 2015-11-10 (×2): 3 [IU] via SUBCUTANEOUS
  Administered 2015-11-10: 5 [IU] via SUBCUTANEOUS
  Administered 2015-11-10 – 2015-11-11 (×3): 3 [IU] via SUBCUTANEOUS
  Administered 2015-11-12: 5 [IU] via SUBCUTANEOUS
  Administered 2015-11-12 (×2): 3 [IU] via SUBCUTANEOUS
  Administered 2015-11-13: 2 [IU] via SUBCUTANEOUS
  Administered 2015-11-13: 3 [IU] via SUBCUTANEOUS
  Administered 2015-11-13: 8 [IU] via SUBCUTANEOUS
  Administered 2015-11-14: 5 [IU] via SUBCUTANEOUS
  Administered 2015-11-14: 3 [IU] via SUBCUTANEOUS
  Administered 2015-11-14: 2 [IU] via SUBCUTANEOUS
  Administered 2015-11-15: 3 [IU] via SUBCUTANEOUS
  Administered 2015-11-15: 8 [IU] via SUBCUTANEOUS
  Administered 2015-11-15 – 2015-11-16 (×2): 3 [IU] via SUBCUTANEOUS

## 2015-11-09 MED ORDER — FERROUS SULFATE 325 (65 FE) MG PO TABS
325.0000 mg | ORAL_TABLET | Freq: Every day | ORAL | Status: DC
  Administered 2015-11-09: 325 mg via ORAL
  Filled 2015-11-09: qty 1

## 2015-11-09 MED ORDER — PANTOPRAZOLE SODIUM 40 MG PO TBEC
40.0000 mg | DELAYED_RELEASE_TABLET | Freq: Every day | ORAL | Status: DC
  Administered 2015-11-09 – 2015-11-16 (×8): 40 mg via ORAL
  Filled 2015-11-09 (×8): qty 1

## 2015-11-09 MED ORDER — ONDANSETRON HCL 4 MG PO TABS
4.0000 mg | ORAL_TABLET | Freq: Four times a day (QID) | ORAL | Status: DC | PRN
  Administered 2015-11-13: 4 mg via ORAL
  Filled 2015-11-09: qty 1

## 2015-11-09 MED ORDER — ENOXAPARIN SODIUM 100 MG/ML ~~LOC~~ SOLN
100.0000 mg | Freq: Two times a day (BID) | SUBCUTANEOUS | Status: AC
  Administered 2015-11-09 – 2015-11-11 (×6): 100 mg via SUBCUTANEOUS
  Filled 2015-11-09 (×6): qty 1

## 2015-11-09 MED ORDER — DARIFENACIN HYDROBROMIDE ER 15 MG PO TB24
15.0000 mg | ORAL_TABLET | Freq: Every day | ORAL | Status: DC
  Administered 2015-11-09 – 2015-11-16 (×8): 15 mg via ORAL
  Filled 2015-11-09 (×8): qty 1

## 2015-11-09 MED ORDER — ATORVASTATIN CALCIUM 10 MG PO TABS
20.0000 mg | ORAL_TABLET | Freq: Every day | ORAL | Status: DC
  Administered 2015-11-09 – 2015-11-15 (×7): 20 mg via ORAL
  Filled 2015-11-09 (×3): qty 2
  Filled 2015-11-09: qty 1
  Filled 2015-11-09 (×5): qty 2

## 2015-11-09 MED ORDER — BIOTENE DRY MOUTH MT LIQD: 15.0000 mL | OROMUCOSAL | Status: DC | PRN

## 2015-11-09 MED ORDER — ARTIFICIAL TEARS OP OINT: TOPICAL_OINTMENT | Freq: Every day | OPHTHALMIC | Status: DC | PRN

## 2015-11-09 MED ORDER — DOCUSATE SODIUM 100 MG PO CAPS
100.0000 mg | ORAL_CAPSULE | Freq: Every day | ORAL | Status: DC | PRN
  Administered 2015-11-09 – 2015-11-10 (×2): 100 mg via ORAL
  Filled 2015-11-09 (×2): qty 1

## 2015-11-09 MED ORDER — POTASSIUM CHLORIDE CRYS ER 20 MEQ PO TBCR
40.0000 meq | EXTENDED_RELEASE_TABLET | Freq: Once | ORAL | Status: AC
  Administered 2015-11-09: 40 meq via ORAL
  Filled 2015-11-09: qty 2

## 2015-11-09 MED ORDER — ACETAMINOPHEN 325 MG PO TABS
650.0000 mg | ORAL_TABLET | Freq: Four times a day (QID) | ORAL | Status: DC | PRN
  Administered 2015-11-14 – 2015-11-16 (×3): 650 mg via ORAL
  Filled 2015-11-09 (×3): qty 2

## 2015-11-09 MED ORDER — LOSARTAN POTASSIUM 50 MG PO TABS
100.0000 mg | ORAL_TABLET | Freq: Every day | ORAL | Status: DC
  Administered 2015-11-09 – 2015-11-11 (×3): 100 mg via ORAL
  Filled 2015-11-09 (×3): qty 2

## 2015-11-09 MED ORDER — DOXAZOSIN MESYLATE 4 MG PO TABS
4.0000 mg | ORAL_TABLET | Freq: Every day | ORAL | Status: DC
  Administered 2015-11-09 – 2015-11-16 (×8): 4 mg via ORAL
  Filled 2015-11-09 (×4): qty 1
  Filled 2015-11-09: qty 4
  Filled 2015-11-09 (×3): qty 1
  Filled 2015-11-09: qty 4
  Filled 2015-11-09: qty 1

## 2015-11-09 NOTE — Progress Notes (Signed)
PT Cancellation Note  Patient Details Name: SHANGA SHAGENA MRN: [id number] DOB: [birth year]   Cancelled Treatment:    Reason Eval/Treat Not Completed: Medical issues which prohibited therapy (per RN on bedrest with PE and DVT.)   Claretha Cooper 11/09/2015, 3:27 PM Tresa Endo PT [phone]

## 2015-11-09 NOTE — Procedures (Signed)
Ultrasound-guided diagnostic and therapeutic paracentesis performed yielding 4.8 liters of yellow fluid. No immediate complications. A portion of the fluid was sent to the lab for preordered studies.

## 2015-11-09 NOTE — Progress Notes (Signed)
ANTICOAGULATION CONSULT NOTE - Follow-up Consult  Pharmacy Consult for heparin IV to enoxaparin  Indication: pulmonary embolus  No Known Allergies  Patient Measurements: Height: 5\' 11"  (180.3 cm) Weight: 222 lb 10.6 oz (101 kg) IBW/kg (Calculated) : 75.3 Heparin Dosing Weight: 96 kg  Vital Signs: Temp: 97.4 F (36.3 C) (10/19 0630) Temp Source: Oral (10/19 0630) BP: 139/78 (10/19 0700) Pulse Rate: 96 (10/19 0700)  Labs:  Recent Labs  11/08/15 1953 11/08/15 2000 11/09/15 0315 11/09/15 0338 11/09/15 0642  HGB  --  9.6*  --  8.8*  --   HCT  --  30.5*  --  27.7*  --   PLT  --  402*  --  382  --   APTT 31  --   --   --   --   LABPROT 15.1  --   --   --   --   INR 1.18  --   --   --   --   HEPARINUNFRC  --   --   --   --  0.12*  CREATININE  --  0.90  --  0.90  --   TROPONINI  --   --  0.03*  --  0.03*    Estimated Creatinine Clearance: 74 mL/min (by C-G formula based on SCr of 0.9 mg/dL).  Assessment: 25 yoM with PMH DM, aortic stenosis, ocular melanoma and metastatic cancer of unknown primary with malignant ascites presents from home with shortness of breath.  CTa reveals submassive PE. Pharmacy to dose heparin IV for pulmonary embolism. Per MD, patient will likely need thoracentesis tomorrow AM.   Baseline INR = 1.18, aPTT = 31sec  Prior anticoagulation: none  Significant events:  Today, 11/09/2015:  Initial heparin level - subtherapeutic  CBC: Hgb low, likely d/t malignancy; Plt wnl  No bleeding or infusion issues per nursing  Goal of Therapy: LMWH anti-Xa level 0.6-1.0 units/mL Monitor platelets by anticoagulation protocol: Yes  Plan:  Orders to convert heparin gtt to enoxaparin - start enoxaparin 100mg  (1mg /kg) q12h.  Since heparin level is SUBtherapeutic will not have RN hold heparin 1h then give 1st dose of LMWH  OK to proceed with LMWH - no definitive plans for paracentesis at this time.  Scheduled as outpatient to have paracentesis performed  10/20.    CBC at minimum of q72h while in hospital and on LMWH  Monitor for signs of bleeding or thrombosis   Doreene Eland, PharmD, BCPS.   Pager: [phone] 11/09/2015 7:36 AM

## 2015-11-09 NOTE — Progress Notes (Signed)
CRITICAL VALUE ALERT  Critical value received:  Troponin 0.03  Date of notification:  11/09/2015  Time of notification:  0417  Critical value read back: yes  Nurse who received alert:  Petra Kuba, RN  MD notified (1st page): [place]  Time of first page:  [phone]  MD notified (2nd page):  Time of second page:  Responding MD:    Time MD responded:

## 2015-11-09 NOTE — Progress Notes (Signed)
  Echocardiogram 2D Echocardiogram has been performed.  Martin Leblanc 11/09/2015, 11:53 AM

## 2015-11-09 NOTE — Care Management Note (Signed)
Case Management Note  Patient Details  Name: Martin Leblanc MRN: [id number] Date of Birth: 1929/03/24  Subjective/Objective:    P.E. Confirmed by ct scan of chest elevated troponin  Levels elevated.             Action/Plan:   Expected Discharge Date:   (unknown)               Expected Discharge Plan:  Home/Self Care  In-House Referral:     Discharge planning Services     Post Acute Care Choice:    Choice offered to:     DME Arranged:    DME Agency:     HH Arranged:    HH Agency:     Status of Service:  In process, will continue to follow  If discussed at Long Length of Stay Meetings, dates discussed:    Additional Comments:Date:  November 09, 2015 Chart reviewed for concurrent status and case management needs. Will continue to follow the patient for status change: Discharge Planning: following for needs Expected discharge date: [id number] Velva Harman, BSN, [place], [place]  Leeroy Cha, RN 11/09/2015, 11:02 AM

## 2015-11-09 NOTE — Consult Note (Signed)
Martin Leblanc  [phone]   HEMATOLOGY ONCOLOGY INPATIENT CONSULTATION   Martin Leblanc  DOB: 1929-05-02  MR#: [id number]  CSN#: [id number]    Requesting Physician: [facility]  Patient Care Team: Reynold Bowen, MD as PCP - General (Endocrinology)  Reason for consult: Metastatic cancer  History of present illness:  Martin Leblanc is a 82 y.o. gentleman with a history melanoma affecting his left eye, HTN, DM, and GERD, recently diagnosed right colon cancer, was admitted to [facility] yesterday for worsening dyspnea. He CT scan showed aubmassive bilateral pulmonary embolism. He is on anticoagulation with heparin drip. He was scheduled to see me in my clinic tomorrow.  He presented with iron deficient anemia, was referred to local GI, underwent a colonoscopy which showed a partially obstructive sickle mass. Biopsy showed invasive adenocarcinoma. CT scan showed multiple liver metastasis, ascites, and the peritoneum carcinomatosis. He CT scan yesterday also showed a large right lower lobe lung mass. He underwent paracentesis this morning.  He lives alone, used to be very independent. His health has been declining in the past 3-4 months, especially in the past 2 weeks, he has progressive dyspnea, abdominal loading, is only able to walk for short distance. He also has significant fatigue, anorexia.    MEDICAL HISTORY:  Past Medical History:  Diagnosis Date  . Diabetes mellitus without complication (HCC)    Insulin use  . Edema extremities    right greater than left feet.  Marland Kitchen GERD (gastroesophageal reflux disease)   . Hypertension   . Malignant melanoma ([place])    s/p treatment 25 yrs ago-left eye  . Shortness of breath dyspnea    recent had hospital visit 7'17 [facility]- slightly improved.  . Sight impaired    left eye- hx melanoma/radiation    SURGICAL HISTORY: Past Surgical History:  Procedure Laterality Date  . CYSTOSCOPY W/ RETROGRADES Left  09/18/2015   Procedure: CYSTOSCOPY WITH RETROGRADE PYELOGRAM;  Surgeon: Raynelle Bring, MD;  Location: [facility];  Service: Urology;  Laterality: Left;  . CYSTOSCOPY WITH BIOPSY N/A 09/18/2015   Procedure: CYSTOSCOPY WITH BLADDER BIOPSY;  Surgeon: Raynelle Bring, MD;  Location: [facility];  Service: Urology;  Laterality: N/A;  . EYE SURGERY Left    laser eye surgery, after radiation for melanoma tx  . INGUINAL HERNIA REPAIR     early 2000s  . LUNG SURGERY      SOCIAL HISTORY: Social History   Social History  . Marital status: Married    Spouse name: N/A  . Number of children: 4  . Years of education: N/A   Occupational History  . Not on file.   Social History Main Topics  . Smoking status: Former Smoker    Packs/day: 0.50    Years: 20.00    Types: Cigarettes    Quit date: 09/06/1973  . Smokeless tobacco: Never Used     Comment: quit 45 years ago  . Alcohol use No  . Drug use: No  . Sexual activity: Not Currently   Other Topics Concern  . 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 Diabetes Maternal Grandmother   . Emphysema Father     ALLERGIES:  has No Known Allergies.  MEDICATIONS:  Current Facility-Administered Medications  Medication Dose Route Frequency  Provider Last Rate Last Dose  . acetaminophen (TYLENOL) tablet 650 mg  650 mg Oral Q6H PRN Lily Kocher, MD       Or  . acetaminophen (TYLENOL) suppository 650 mg  650 mg Rectal Q6H PRN Lily Kocher, MD      . antiseptic oral rinse (BIOTENE) solution 15 mL  15 mL Mouth Rinse PRN Lily Kocher, MD      . artificial tears (LACRILUBE) ophthalmic ointment   Both Eyes Daily PRN Lily Kocher, MD      . atorvastatin (LIPITOR) tablet 20 mg  20 mg Oral Daily Lily Kocher, MD      . darifenacin (ENABLEX) 24 hr tablet 15 mg  15 mg Oral Daily Lily Kocher, MD   15 mg at 11/09/15 1002  . docusate sodium (COLACE) capsule 100 mg  100 mg Oral Daily PRN Lily Kocher, MD      . doxazosin (CARDURA) tablet 4 mg  4 mg Oral Daily Lily Kocher, MD   4 mg at 11/09/15 1003  . enoxaparin (LOVENOX) injection 100 mg  100 mg Subcutaneous BID Berton Mount, RPH   100 mg at 11/09/15 0902  . ferrous sulfate tablet 325 mg  325 mg Oral BID WC Janece Canterbury, MD   325 mg at 11/09/15 1658  . insulin aspart (novoLOG) injection 0-15 Units  0-15 Units Subcutaneous TID WC Lily Kocher, MD   3 Units at 11/09/15 1658  . losartan (COZAAR) tablet 100 mg  100 mg Oral Daily Lily Kocher, MD   100 mg at 11/09/15 1002  . ondansetron (ZOFRAN) tablet 4 mg  4 mg Oral Q6H PRN Lily Kocher, MD       Or  . ondansetron [facility]) injection 4 mg  4 mg Intravenous Q6H PRN Lily Kocher, MD      . pantoprazole (PROTONIX) EC tablet 40 mg  40 mg Oral Daily Lily Kocher, MD   40 mg at 11/09/15 1002  . sodium chloride flush (NS) 0.9 % injection 3 mL  3 mL Intravenous Q12H Lily Kocher, MD   3 mL at 11/09/15 1003  . tadalafil (CIALIS) tablet 5 mg  5 mg Oral QHS PRN Lily Kocher, MD        REVIEW OF SYSTEMS:   Constitutional: Denies fevers, chills or abnormal night sweats, (+) fatigue Eyes: Denies blurriness of vision, double vision or watery eyes Ears, nose, mouth, throat, and face: Denies mucositis or sore throat Respiratory: (+) Progressive dyspnea Cardiovascular: Denies palpitation, chest discomfort or lower extremity swelling Gastrointestinal:  Denies nausea, heartburn or change in bowel habits, (+) bloating Skin: Denies abnormal skin rashes Lymphatics: Denies new lymphadenopathy or easy bruising Neurological:Denies numbness, tingling or new weaknesses Behavioral/Psych: Mood is stable, no new changes  All other systems were reviewed with the patient and are negative.  PHYSICAL EXAMINATION: ECOG PERFORMANCE STATUS: 3 - Symptomatic, >50% confined to bed  Vitals:   11/09/15  1500 11/09/15 1600  BP: 116/67 (!) 146/73  Pulse: 95 (!) 101  Resp: 20 (!) 21  Temp:  98.1 F (36.7 C)   Filed Weights   11/08/15 1858 11/09/15 0415  Weight: 220 lb (99.8 kg) 222 lb 10.6 oz (101 kg)    GENERAL:alert, no distress and comfortable SKIN: skin color, texture, turgor are normal, no rashes or significant lesions EYES: normal, conjunctiva are pink and non-injected, sclera clear OROPHARYNX:no exudate, no erythema and lips, buccal mucosa, and tongue normal  NECK: supple, thyroid normal size, non-tender, without nodularity LYMPH:  no  palpable lymphadenopathy in the cervical, axillary or inguinal LUNGS: clear to auscultation and percussion with normal breathing effort HEART: regular rate & rhythm and no murmurs and no lower extremity edema ABDOMEN:abdomen soft, Distended, no organomegaly no tenderness, (+) ascites  Musculoskeletal:no cyanosis of digits and no clubbing  PSYCH: alert & oriented x 3 with fluent speech NEURO: no focal motor/sensory deficits  LABORATORY DATA:  I have reviewed the data as listed Lab Results  Component Value Date   WBC 8.6 11/09/2015   HGB 8.8 (L) 11/09/2015   HCT 27.7 (L) 11/09/2015   MCV 74.7 (L) 11/09/2015   PLT 382 11/09/2015    Recent Labs  07/24/15 0741  09/07/15 1220 11/08/15 2000 11/09/15 0338  NA 138  < > 139 136 135  K 4.0  < > 4.9 4.1 3.4*  CL 110  < > 108 105 105  CO2 22  < > _0 GLUCOSE 117*  < > 109* 86 118*  BUN 17  < > _1 CREATININE 0.92  < > 0.95 0.90 0.90  CALCIUM 9.6  < > 9.9 8.9 8.9  GFRNONAA >60  < > >60 >60 >60  GFRAA >60  < > >60 >60 >60  PROT 6.7  --   --  6.9  --   ALBUMIN 3.2*  --   --  2.9*  --   AST 24  --   --  36  --   ALT 18  --   --  24  --   ALKPHOS 88  --   --  104  --   BILITOT 0.4  --   --  0.8  --   < > = values in this interval not displayed.  RADIOGRAPHIC STUDIES: I have personally reviewed the radiological images as listed and agreed with the findings in the report. Ct  Angio Chest Pe W And/or Wo Contrast  Result Date: 11/08/2015 CLINICAL DATA:  Acute onset of worsening shortness of breath. Current history of metastatic cancer within the lung and liver. Initial encounter. EXAM: CT ANGIOGRAPHY CHEST WITH CONTRAST TECHNIQUE: Multidetector CT imaging of the chest was performed using the standard protocol during bolus administration of intravenous contrast. Multiplanar CT image reconstructions and MIPs were obtained to evaluate the vascular anatomy. CONTRAST:  100 mL of Isovue 370 IV contrast COMPARISON:  CT of the abdomen and pelvis from 11/06/2015, and CTA of the chest performed 07/24/2015 FINDINGS: Cardiovascular: Pulmonary embolus is noted within the right main pulmonary artery, extending into all lobes of the right lung, and also within the left main pulmonary artery, extending into all lobes of the left lung. The RV/LV ratio of 1.1 corresponds to right heart strain and concern for submassive pulmonary embolus. Calcification is noted at the aortic and mitral valves. Diffuse coronary artery calcifications are seen. Scattered calcification is seen along the aortic arch and descending thoracic aorta. The great vessels are grossly unremarkable in appearance. Mediastinum/Nodes: A prominent subcarinal node is noted, 1.4 cm in short axis. No pericardial effusion is seen. The thyroid gland is unremarkable in appearance. No axillary lymphadenopathy is appreciated. Lungs/Pleura: Scattered bilateral pulmonary nodules are seen, predominantly at the right lower lobe, the largest of which measures 4.9 cm in size. Underlying trace bilateral pleural effusions are noted, with bibasilar opacities likely reflecting atelectasis. A 5 mm pulmonary nodule is noted at the right upper lobe (image 37 of 92). A pulmonary nodule is also noted at the left upper lobe (image 20  of 92). No pneumothorax is seen. Upper Abdomen: Innumerable hypodense lesions are seen throughout the liver, compatible with  metastatic disease. The visualized portions of the spleen are unremarkable. Moderate volume ascites is seen at the upper abdomen. Irregular wall thickening is noted at the patient's hiatal hernia, which could conceivably reflect underlying mass, or intraluminal contents. Musculoskeletal: No acute osseous abnormalities are identified. Mild degenerative change is noted at the lower cervical and upper thoracic spine. The visualized musculature is unremarkable in appearance. Review of the MIP images confirms the above findings. IMPRESSION: 1. Pulmonary embolus noted within both main pulmonary arteries, extending into all lobes of both lungs. Positive for acute PE with CT evidence of right heart strain (RV/LV Ratio = 1.1) consistent with at least submassive (intermediate risk) PE. The presence of right heart strain has been associated with an increased risk of morbidity and mortality. Please activate Code PE by paging [phone]. 2. 4.9 cm mass at the right lower lobe. Scattered additional pulmonary nodules noted bilaterally, most prominent at the right lower lobe. On correlation with prior studies, this may reflect a bronchogenic primary malignancy with associated metastases. 3. Innumerable metastases noted throughout the liver. 4. Prominent subcarinal node, measuring 1.4 cm in short axis common may reflect metastatic disease. 5. Diffuse coronary artery calcifications seen. 6. Moderate ascites at the upper abdomen. 7. Irregular wall thickening again noted at the hiatal hernia. This could reflect underlying mass, given clinical concern, or simply intraluminal contents. However, it appears to be new from July, whereas the right lower lobe lung mass has gradually increased in size, and thus primary malignancy at the hiatal hernia is considered less likely. Critical Value/emergent results were called by telephone at the time of interpretation on 11/08/2015 at 9:21 pm to Dr. Shirlyn Goltz, who verbally acknowledged these  results. Electronically Signed   By: Garald Balding M.D.   On: 11/08/2015 21:53   Ct Abdomen Pelvis W Contrast  Result Date: 11/06/2015 CLINICAL DATA:  Colonoscopy, cecal mass EXAM: CT ABDOMEN AND PELVIS WITH CONTRAST TECHNIQUE: Multidetector CT imaging of the abdomen and pelvis was performed using the standard protocol following bolus administration of intravenous contrast. CONTRAST:  158m ISOVUE-300 IOPAMIDOL (ISOVUE-300) INJECTION 61% COMPARISON:  CT chest 07/24/2015 FINDINGS: *Lower chest: There is bilateral small pleural effusion. There is intercostal herniation of the lung parenchyma in right chest wall laterally please see axial image 10. A nodular mass right lower lobe posteriorly has increased in size from prior exam measures 4.8 cm highly suspicious for metastatic disease. Hiatal hernia is noted. There is thickening of distal esophageal wall. Neoplastic process cannot be excluded. Please see axial image 15. Hepatobiliary: Innumerable nodular lesions are noted within liver consistent with metastatic disease. Pancreas: Enhanced pancreas is unremarkable. Spleen: Enhanced spleen is unremarkable. There is perihepatic and perisplenic ascites. There is a diaphragmatic nodule in left anterior diaphragm measures 1.5 cm suspicious for metastatic disease. Adrenals/Urinary Tract: No adrenal gland mass. No hydronephrosis or hydroureter. There is a probable cyst within right kidney anteriorly measures 3 cm. Stomach/Bowel: There is perihepatic and perisplenic ascites. Perigastric ascites is noted. There is focal thickening of distal gastric wall anteriorly measures 1.9 cm. This is highly suspicious for a gastric mass. Correlation with endoscopy is recommended. No small bowel obstruction. Normal appendix is partially visualized. There is irregular thickening of cecal wall. Cecal mass cannot be excluded. No colonic obstruction. Vascular/Lymphatic: A retroperitoneal lymph node aortocaval region measures 1.4 cm  suspicious for metastatic disease. There is nodular omental thickening highly suspicious  for omental metastatic disease and omental caking the largest deposit measures 1.9 cm. No aortic aneurysm. Atherosclerotic calcifications of abdominal aorta and iliac arteries are noted. Reproductive: Enlarged prostate gland. The prostate gland measures 6.3 by 5.2 cm. Other: There is a right inguinal scrotal canal hernia containing fat measures 3.2 cm. Small nonspecific bilateral inguinal lymph nodes are noted. Musculoskeletal: Sagittal images of the spine shows degenerative changes thoracolumbar spine. Degenerative changes are noted pubic symphysis. IMPRESSION: 1. There is intercostal herniation of the lung parenchyma in right chest wall laterally measures about 6 cm without evidence of acute complication. Progression in size of nodular mass in right lower lobe pleural based measures 4.8 cm suspicious for metastatic disease. 2. There is thickening of distal esophageal wall. Neoplastic process cannot be excluded. 3. Innumerable nodular lesions are noted throughout the liver suspicious for metastatic disease. 4. There is focal thickening of distal gastric wall axial image 32 highly suspicious for a gastric mass. 5. There is abdominal and pelvic ascites. Nodular thickening of the omentum highly suspicious for metastatic disease and omental caking. Irregular thickening of cecal wall suspicious for a mass please see axial image 53 6. No distal colonic obstruction. 7. Enlarged prostate gland with indentation of urinary bladder base. 8. Degenerative changes thoracolumbar spine. Electronically Signed   By: Lahoma Crocker M.D.   On: 11/06/2015 17:20   US Paracentesis  Result Date: 11/09/2015 INDICATION: Patient with history of PE, remote melanoma, liver/lung lesions, cecal mass, ascites. Request made for diagnostic and therapeutic paracentesis. EXAM: ULTRASOUND GUIDED DIAGNOSTIC AND THERAPEUTIC PARACENTESIS MEDICATIONS: None.  COMPLICATIONS: None immediate. PROCEDURE: Informed written consent was obtained from the patient after a discussion of the risks, benefits and alternatives to treatment. A timeout was performed prior to the initiation of the procedure. Initial ultrasound scanning demonstrates a moderate-to-large amount of ascites within the right lower abdominal quadrant. The right lower abdomen was prepped and draped in the usual sterile fashion. 1% lidocaine was used for local anesthesia. Following this, a Yueh catheter was introduced. An ultrasound image was saved for documentation purposes. The paracentesis was performed. The catheter was removed and a dressing was applied. The patient tolerated the procedure well without immediate post procedural complication. FINDINGS: A total of approximately 4.8 liters of yellow fluid was removed. Samples were sent to the laboratory as requested by the clinical team. IMPRESSION: Successful ultrasound-guided diagnostic and therapeutic paracentesis yielding 4.8 liters of peritoneal fluid. Read by: Rowe Robert, PA-C Electronically Signed   By: Jerilynn Mages.  Shick M.D.   On: 11/09/2015 12:30   Dg Chest Port 1 View  Result Date: 11/08/2015 CLINICAL DATA:  Shortness of breath worsening today, history of cancer of the lung, colon and liver, diabetes mellitus, hypertension, melanoma EXAM: PORTABLE CHEST 1 VIEW COMPARISON:  Portable exam 1900 hours compared to 07/24/2015 FINDINGS: Normal heart size, mediastinal contours, and pulmonary vascularity. Atherosclerotic calcification aorta. Again identified peripheral opacity at the lower RIGHT chest question atelectasis versus scarring unchanged. Small focus of gas at the inferior RIGHT chest projects beyond the costal margin, unchanged since prior chest radiograph but not localized on CT. RIGHT infrahilar opacity corresponding to rounded opacity on prior CT again seen. Remaining lungs clear. No pleural effusion or pneumothorax. IMPRESSION: Chronic opacity at  the lateral lower RIGHT chest may represent scarring or chronic atelectasis, less likely chronic infiltrate. Rounded RIGHT infrahilar opacity appears unchanged since prior CT, was of fluid attenuation on prior CT question loculated fluid versus necrotic nodule. Aortic atherosclerosis. Remainder of exam unremarkable. Electronically Signed  By: Lavonia Dana M.D.   On: 11/08/2015 19:41    ASSESSMENT & PLAN:  82 year old gentleman with recently diagnosed right colon cancer, presented with progressive dyspnea, fatigue, and abdominal bloating. CT scan showed diffuse liver, peritoneum metastasis, ascites and a large right lower lobe lung mass.  1. Metastatic cancer, likely from colon 2. Iron deficient anemia 3. Ascites, likely malignant 4. Deconditioning 5. Hypertension 6. Diabetes  Recommendations: -He has metastatic cancer, probably from colon, also lung cancer is also a possibility. -I discussed with Dr. Saralyn Pilar sentinel, his cytology preliminarily is negative for malignant cells, cell block will be ready for review tomorrow -If his final cytology is negative, I would recommend peritoneum mass biopsy by interventional radiology. I spoke with IR today, please order CT guided biopsy for tomorrow -I have requested his colon mass biopsy to be tested for MSI and MMR, to see if he is a candidate for immunotherapy. Given his advanced age and poor performance status, I did not think he is a candidate for cytotoxic chemotherapy. -I discussed the above with pt and his daughter at bedside   I will follow up.   All questions were answered. The patient knows to call the clinic with any problems, questions or concerns.      Truitt Merle, MD 11/09/2015 5:04 PM

## 2015-11-09 NOTE — Progress Notes (Addendum)
VASCULAR LAB PRELIMINARY  PRELIMINARY  PRELIMINARY  PRELIMINARY  Bilateral lower extremity venous duplex completed.    Preliminary report:  Right: Acute DVT noted in the posterior tibial and peroneal coursing through the gastrocnemius, popliteal, and distal femoral veins .  No evidence of superficial thrombosis.  No Baker's cyst. Left:  No evidence of DVT, superficial thrombosis, or Baker's cyst.  Alexus Michael, RVS 11/09/2015, 9:00 AM

## 2015-11-09 NOTE — ED Notes (Signed)
Pt. CBG 119. RN,Holly made aware.

## 2015-11-09 NOTE — Progress Notes (Deleted)
Oncology Nurse Navigator Documentation  Oncology Nurse Navigator Flowsheets 11/09/2015  Navigator Location [facility]  Referral date to RadOnc/MedOnc -  Navigator Encounter Type Letter/Fax/Email  Telephone -  Abnormal Finding Date -  Confirmed Diagnosis Date -  Barriers/Navigation Needs Coordination of Care--MSI and MMR/IHC testing  Interventions Coordination of Care  Coordination of Care Other  Acuity -  Time Spent with Patient 30

## 2015-11-09 NOTE — Progress Notes (Signed)
PROGRESS NOTE  Martin Leblanc  [id number] DOB: 02/02/1929 DOA: 11/08/2015 PCP: Sheela Stack, MD  Brief Narrative:   Martin Leblanc is a 82 y.o. gentleman with a history melanoma affecting his left eye, newly diagnosed metastatic adenocarcinoma likely colon primary, HTN, DM, and GERD who presented to the ED with progressive SOB.  He was found to have submassive PE, probable malignant ascites and lung mets.  He was also in a-fib with RVR.    Assessment & Plan:   Principal Problem:   Pulmonary embolus (HCC) Active Problems:   Dyspnea   Ascites   Atrial fibrillation with RVR (HCC)   Lung mass   Mass of cecum   Liver metastases (HCC)   Acute respiratory failure with hypoxia (HCC)   Aortic stenosis   PE (pulmonary thromboembolism) (HCC)  Submassive PE with acute hypoxia and hemodynamic instability, RIGHT lower extremity DVT. -- PCCM recommended against TPA given extent of metastatic cancer -- DC IV heparin -- Start Lovenox --Echo pending --Troponin negative --Lower extremity ultrasound pending --Strict bedrest until patient has been on adequate anticoagulation for at least 24 hours --Supplemental oxygen as needed  Paroxysmal a fib, CHADS-Vasc 4 --Likely provoked by hypoxia and acute PE --Anticoagulated with Lovenox --TSH within normal limits --Echo pending  Metastatic adenocarcinoma, likely colon primary --Dr. Burr Medico has been called and will see patient in hospital either today or tomorrow  Ascites, likely malignant based on CT scan -  Paracentesis performed with removal of 4.8L of yellow fluid on 10/19 -  F/u cytology  HTN --hold ARB due to relative hypotension present on admission  GERD --PPI  History of BPH --Continue home meds  DM type 2 --Low carb diet, SSI -  Check A1c  Iron deficiency anemia due to metastatic cancer -  B12, folate -  feraheme x 1 -  Increase oral iron to twice daily  Hypokalemia, given oral potassium supplementation -   Check potassium in a.m.  DVT prophylaxis:  Lovenox  Code Status:  Full code Family Communication:  Patient alone Disposition Plan:  Awaiting oncology consultation. We will have case manager meet with patient to discuss Lovenox injections. Awaiting echocardiogram.  Will need at least 24 hours of full anticoagulation, then have PT eval tomorrow to determine disposition.     Consultants:   PC CM  Procedures:  Lower extremity duplex on 10/19:  Acute right lower extremity DVT Paracentesis on 10/19:  Removal of 4.8 L of yellow fluid Echocardiogram  Antimicrobials:   None    Subjective: Patient states that he still feels Martin Leblanc of breath. His abdomen is very distended and tense and uncomfortable. He has been coughing some. He denies hemoptysis.  Objective: Vitals:   11/09/15 0600 11/09/15 0630 11/09/15 0700 11/09/15 0800  BP: (!) 111/59 (!) 116/58 139/78   Pulse: 93 92 96   Resp: (!) 21 20 (!) 21   Temp:  97.4 F (36.3 C)  97.4 F (36.3 C)  TempSrc:  Oral  Oral  SpO2: 92% 90% 94%   Weight:      Height:        Intake/Output Summary (Last 24 hours) at 11/09/15 0852 Last data filed at 11/09/15 0700  Gross per 24 hour  Intake           320.25 ml  Output              165 ml  Net           155.25 ml  Filed Weights   11/08/15 1858 11/09/15 0415  Weight: 99.8 kg (220 lb) 101 kg (222 lb 10.6 oz)    Examination:  General exam:  Adult Male. Mild tachypnea HEENT:  NCAT, MMM Respiratory system: Diminished at the bilateral bases, otherwise clear to auscultation bilaterally Cardiovascular system: Regular rate and rhythm, normal S1/S2. No murmurs, rubs, gallops or clicks.  Warm extremities Gastrointestinal system: Normal active bowel sounds, severely distended and tense, uncomfortable but not painful with palpation MSK:  Normal tone and bulk, no lower extremity edema Neuro:  Grossly moves all extremities    Data Reviewed: I have personally reviewed following labs and imaging  studies  CBC:  Recent Labs Lab 11/08/15 2000 11/09/15 0338  WBC 10.3 8.6  NEUTROABS 8.3*  --   HGB 9.6* 8.8*  HCT 30.5* 27.7*  MCV 75.3* 74.7*  PLT 402* [id number]   Basic Metabolic Panel:  Recent Labs Lab 11/08/15 2000 11/09/15 0315 11/09/15 0338  NA 136  --  135  K 4.1  --  3.4*  CL 105  --  105  CO2 22  --  23  GLUCOSE 86  --  118*  BUN 12  --  11  CREATININE 0.90  --  0.90  CALCIUM 8.9  --  8.9  MG  --  1.7  --    GFR: Estimated Creatinine Clearance: 74 mL/min (by C-G formula based on SCr of 0.9 mg/dL). Liver Function Tests:  Recent Labs Lab 11/08/15 2000  AST 36  ALT 24  ALKPHOS 104  BILITOT 0.8  PROT 6.9  ALBUMIN 2.9*    Recent Labs Lab 11/08/15 2000  LIPASE 13   No results for input(s): AMMONIA in the last 168 hours. Coagulation Profile:  Recent Labs Lab 11/08/15 1953  INR 1.18   Cardiac Enzymes:  Recent Labs Lab 11/09/15 0315 11/09/15 0642  TROPONINI 0.03* 0.03*   BNP (last 3 results) No results for input(s): PROBNP in the last 8760 hours. HbA1C: No results for input(s): HGBA1C in the last 72 hours. CBG:  Recent Labs Lab 11/08/15 2009 11/09/15 0157 11/09/15 0817  GLUCAP 90 119* 164*   Lipid Profile: No results for input(s): CHOL, HDL, LDLCALC, TRIG, CHOLHDL, LDLDIRECT in the last 72 hours. Thyroid Function Tests:  Recent Labs  11/09/15 0315  TSH 2.679   Anemia Panel: No results for input(s): VITAMINB12, FOLATE, FERRITIN, TIBC, IRON, RETICCTPCT in the last 72 hours. Urine analysis:    Component Value Date/Time   COLORURINE YELLOW 07/25/2015 1257   APPEARANCEUR TURBID (A) 07/25/2015 1257   LABSPEC 1.019 07/25/2015 1257   PHURINE 6.5 07/25/2015 1257   GLUCOSEU 500 (A) 07/25/2015 1257   HGBUR NEGATIVE 07/25/2015 1257   BILIRUBINUR NEGATIVE 07/25/2015 1257   KETONESUR NEGATIVE 07/25/2015 1257   PROTEINUR NEGATIVE 07/25/2015 1257   NITRITE NEGATIVE 07/25/2015 1257   LEUKOCYTESUR MODERATE (A) 07/25/2015 1257   Sepsis  Labs: @LABRCNTIP (procalcitonin:4,lacticidven:4)  ) Recent Results (from the past 240 hour(s))  MRSA PCR Screening     Status: None   Collection Time: 11/09/15  4:20 AM  Result Value Ref Range Status   MRSA by PCR NEGATIVE NEGATIVE Final    Comment:        The GeneXpert MRSA Assay (FDA approved for NASAL specimens only), is one component of a comprehensive MRSA colonization surveillance program. It is not intended to diagnose MRSA infection nor to guide or monitor treatment for MRSA infections.       Radiology Studies: Ct Angio Chest Pe W  And/or Wo Contrast  Result Date: 11/08/2015 CLINICAL DATA:  Acute onset of worsening shortness of breath. Current history of metastatic cancer within the lung and liver. Initial encounter. EXAM: CT ANGIOGRAPHY CHEST WITH CONTRAST TECHNIQUE: Multidetector CT imaging of the chest was performed using the standard protocol during bolus administration of intravenous contrast. Multiplanar CT image reconstructions and MIPs were obtained to evaluate the vascular anatomy. CONTRAST:  100 mL of Isovue 370 IV contrast COMPARISON:  CT of the abdomen and pelvis from 11/06/2015, and CTA of the chest performed 07/24/2015 FINDINGS: Cardiovascular: Pulmonary embolus is noted within the right main pulmonary artery, extending into all lobes of the right lung, and also within the left main pulmonary artery, extending into all lobes of the left lung. The RV/LV ratio of 1.1 corresponds to right heart strain and concern for submassive pulmonary embolus. Calcification is noted at the aortic and mitral valves. Diffuse coronary artery calcifications are seen. Scattered calcification is seen along the aortic arch and descending thoracic aorta. The great vessels are grossly unremarkable in appearance. Mediastinum/Nodes: A prominent subcarinal node is noted, 1.4 cm in Donise Woodle axis. No pericardial effusion is seen. The thyroid gland is unremarkable in appearance. No axillary  lymphadenopathy is appreciated. Lungs/Pleura: Scattered bilateral pulmonary nodules are seen, predominantly at the right lower lobe, the largest of which measures 4.9 cm in size. Underlying trace bilateral pleural effusions are noted, with bibasilar opacities likely reflecting atelectasis. A 5 mm pulmonary nodule is noted at the right upper lobe (image 37 of 92). A pulmonary nodule is also noted at the left upper lobe (image 20 of 92). No pneumothorax is seen. Upper Abdomen: Innumerable hypodense lesions are seen throughout the liver, compatible with metastatic disease. The visualized portions of the spleen are unremarkable. Moderate volume ascites is seen at the upper abdomen. Irregular wall thickening is noted at the patient's hiatal hernia, which could conceivably reflect underlying mass, or intraluminal contents. Musculoskeletal: No acute osseous abnormalities are identified. Mild degenerative change is noted at the lower cervical and upper thoracic spine. The visualized musculature is unremarkable in appearance. Review of the MIP images confirms the above findings. IMPRESSION: 1. Pulmonary embolus noted within both main pulmonary arteries, extending into all lobes of both lungs. Positive for acute PE with CT evidence of right heart strain (RV/LV Ratio = 1.1) consistent with at least submassive (intermediate risk) PE. The presence of right heart strain has been associated with an increased risk of morbidity and mortality. Please activate Code PE by paging [phone]. 2. 4.9 cm mass at the right lower lobe. Scattered additional pulmonary nodules noted bilaterally, most prominent at the right lower lobe. On correlation with prior studies, this may reflect a bronchogenic primary malignancy with associated metastases. 3. Innumerable metastases noted throughout the liver. 4. Prominent subcarinal node, measuring 1.4 cm in Niclas Markell axis common may reflect metastatic disease. 5. Diffuse coronary artery calcifications  seen. 6. Moderate ascites at the upper abdomen. 7. Irregular wall thickening again noted at the hiatal hernia. This could reflect underlying mass, given clinical concern, or simply intraluminal contents. However, it appears to be new from July, whereas the right lower lobe lung mass has gradually increased in size, and thus primary malignancy at the hiatal hernia is considered less likely. Critical Value/emergent results were called by telephone at the time of interpretation on 11/08/2015 at 9:21 pm to Dr. Shirlyn Goltz, who verbally acknowledged these results. Electronically Signed   By: Garald Balding M.D.   On: 11/08/2015 21:53   Dg  Chest Port 1 View  Result Date: 11/08/2015 CLINICAL DATA:  Shortness of breath worsening today, history of cancer of the lung, colon and liver, diabetes mellitus, hypertension, melanoma EXAM: PORTABLE CHEST 1 VIEW COMPARISON:  Portable exam 1900 hours compared to 07/24/2015 FINDINGS: Normal heart size, mediastinal contours, and pulmonary vascularity. Atherosclerotic calcification aorta. Again identified peripheral opacity at the lower RIGHT chest question atelectasis versus scarring unchanged. Small focus of gas at the inferior RIGHT chest projects beyond the costal margin, unchanged since prior chest radiograph but not localized on CT. RIGHT infrahilar opacity corresponding to rounded opacity on prior CT again seen. Remaining lungs clear. No pleural effusion or pneumothorax. IMPRESSION: Chronic opacity at the lateral lower RIGHT chest may represent scarring or chronic atelectasis, less likely chronic infiltrate. Rounded RIGHT infrahilar opacity appears unchanged since prior CT, was of fluid attenuation on prior CT question loculated fluid versus necrotic nodule. Aortic atherosclerosis. Remainder of exam unremarkable. Electronically Signed   By: Lavonia Dana M.D.   On: 11/08/2015 19:41     Scheduled Meds: . atorvastatin  20 mg Oral Daily  . darifenacin  15 mg Oral Daily  .  doxazosin  4 mg Oral Daily  . enoxaparin (LOVENOX) injection  100 mg Subcutaneous BID  . ferrous sulfate  325 mg Oral Q breakfast  . insulin aspart  0-15 Units Subcutaneous TID WC  . losartan  100 mg Oral Daily  . magnesium sulfate 1 - 4 g bolus IVPB  2 g Intravenous Once  . pantoprazole  40 mg Oral Daily  . potassium chloride  40 mEq Oral Once  . sodium chloride flush  3 mL Intravenous Q12H   Continuous Infusions:    LOS: 1 day    Time spent: 30 min    Janece Canterbury, MD [facility] Pager [phone]  If 7PM-7AM, please contact night-coverage [email or web address] Password [email or web address] 11/09/2015, 8:52 AM

## 2015-11-10 ENCOUNTER — Ambulatory Visit: Payer: Medicare Other | Admitting: Physical Therapy

## 2015-11-10 ENCOUNTER — Telehealth: Payer: Self-pay | Admitting: Hematology

## 2015-11-10 ENCOUNTER — Inpatient Hospital Stay (HOSPITAL_COMMUNITY): Payer: Medicare Other

## 2015-11-10 ENCOUNTER — Telehealth: Payer: Self-pay | Admitting: *Deleted

## 2015-11-10 ENCOUNTER — Ambulatory Visit (HOSPITAL_COMMUNITY): Payer: Medicare Other

## 2015-11-10 ENCOUNTER — Ambulatory Visit: Payer: Medicare Other | Admitting: Hematology

## 2015-11-10 ENCOUNTER — Encounter: Payer: Medicare Other | Admitting: Nutrition

## 2015-11-10 LAB — BASIC METABOLIC PANEL
Anion gap: 6 (ref 5–15)
BUN: 10 mg/dL (ref 6–20)
CHLORIDE: 107 mmol/L (ref 101–111)
CO2: 24 mmol/L (ref 22–32)
CREATININE: 0.85 mg/dL (ref 0.61–1.24)
Calcium: 8.5 mg/dL — ABNORMAL LOW (ref 8.9–10.3)
Glucose, Bld: 135 mg/dL — ABNORMAL HIGH (ref 65–99)
POTASSIUM: 3.8 mmol/L (ref 3.5–5.1)
SODIUM: 137 mmol/L (ref 135–145)

## 2015-11-10 LAB — GLUCOSE, CAPILLARY
GLUCOSE-CAPILLARY: 157 mg/dL — AB (ref 65–99)
GLUCOSE-CAPILLARY: 182 mg/dL — AB (ref 65–99)
Glucose-Capillary: 118 mg/dL — ABNORMAL HIGH (ref 65–99)
Glucose-Capillary: 226 mg/dL — ABNORMAL HIGH (ref 65–99)

## 2015-11-10 LAB — HEMOGLOBIN A1C
HEMOGLOBIN A1C: 8.3 % — AB (ref 4.8–5.6)
Mean Plasma Glucose: 192 mg/dL

## 2015-11-10 LAB — CBC
HCT: 28.8 % — ABNORMAL LOW (ref 39.0–52.0)
HEMOGLOBIN: 9.1 g/dL — AB (ref 13.0–17.0)
MCH: 23.7 pg — ABNORMAL LOW (ref 26.0–34.0)
MCHC: 31.6 g/dL (ref 30.0–36.0)
MCV: 75 fL — ABNORMAL LOW (ref 78.0–100.0)
PLATELETS: 395 10*3/uL (ref 150–400)
RBC: 3.84 MIL/uL — ABNORMAL LOW (ref 4.22–5.81)
RDW: 15.9 % — ABNORMAL HIGH (ref 11.5–15.5)
WBC: 7.7 10*3/uL (ref 4.0–10.5)

## 2015-11-10 LAB — VITAMIN B12: VITAMIN B 12: 1144 pg/mL — AB (ref 180–914)

## 2015-11-10 LAB — FOLATE: FOLATE: 13.3 ng/mL (ref >5.9)

## 2015-11-10 MED ORDER — GADOBENATE DIMEGLUMINE 529 MG/ML IV SOLN
20.0000 mL | Freq: Once | INTRAVENOUS | Status: AC | PRN
  Administered 2015-11-10: 20 mL via INTRAVENOUS

## 2015-11-10 MED ORDER — CLOTRIMAZOLE 1 % EX CREA
TOPICAL_CREAM | Freq: Two times a day (BID) | CUTANEOUS | Status: DC
  Administered 2015-11-10: 1 via TOPICAL
  Administered 2015-11-11 – 2015-11-12 (×4): via TOPICAL
  Administered 2015-11-14: 1 via TOPICAL
  Filled 2015-11-10: qty 15

## 2015-11-10 MED ORDER — ENSURE ENLIVE PO LIQD
237.0000 mL | Freq: Two times a day (BID) | ORAL | Status: DC
  Administered 2015-11-11 – 2015-11-16 (×8): 237 mL via ORAL

## 2015-11-10 NOTE — Progress Notes (Signed)
Name: SHAHEEN STALOCH MRN: [id number] DOB: 12-20-1929    ADMISSION DATE:  11/08/2015 CONSULTATION DATE:  11/08/2015  REFERRING MD :  Dr. Eulas Post [facility]  CHIEF COMPLAINT:  SOB  BRIEF PATIENT DESCRIPTION: 82 year old male with recently diagnosed widely metastatic cancer now presenting with pulmonary embolism.  SIGNIFICANT EVENTS  10/18 admit  STUDIES:  Colonoscopy [facility]) 10/9 > Large tumor in cecum, obvious carcinoma. Patho consistent with adenocarcinoma. CT abd/pelvis 10/16 > There is intercostal herniation of the lung parenchyma in right chest wall laterally measures about 6 cm without evidence of acute complication. Progression in size of nodular mass in right lower lobe pleural based measures 4.8 cm suspicious for metastatic disease. There is thickening of distal esophageal wall. Neoplastic process cannot be excluded. Innumerable nodular lesions are noted throughout the liver suspicious for metastatic disease. There is focal thickening of distal gastric wall axial image 32 highly suspicious for a gastric mass. There is abdominal and pelvic ascites. Nodular thickening of the omentum highly suspicious for metastatic disease and omental caking. Irregular thickening of cecal wall suspicious for a mass please see axial image 53. CTA chest 10/18 > Pulmonary embolus noted within both main pulmonary arteries, extending into all lobes of both lungs. Positive for acute PE with CT evidence of right heart strain (RV/LV Ratio = 1.1). RLL 4.9cm mass, innumerable liver mets.  TTE 10/19 >> RV was normal size, unable to assess its fxn, moderate LVH, LV fxn 60-65%   HISTORY OF PRESENT ILLNESS:  82 year old male with past medical history as below, which is significant for diabetes, remote malignant melanoma, hypertension, and COPD (followed by Dr. Lamonte Sakai). Recent admission for GIB with symptomatic anemia, which raised concern for intestinal cancer. He underwent colonoscopy at [facility] 10/9, which found large tumor in  cecum and tested positive for adenocarcinoma. Staging CT scan 10/16 unfortunately demonstrated metastatic cancer of unknown primary in lung, liver, nodular thickening of omentum with ascites, and gastric/esophageal/cecal masses. As far as I can tell he has not yet seen an oncologist. 10/18 he presented to [facility] ED with complaints of SOB x 3 days. Workup positive for PE with R heart strain. PCCM to see in consultation.    SUBJECTIVE:  Breathing better. Remains on [place] O2  VITAL SIGNS: Temp:  [97.4 F (36.3 C)-98.2 F (36.8 C)] 98 F (36.7 C) (10/20 0800) Pulse Rate:  [32-110] 94 (10/20 0600) Resp:  [17-26] 19 (10/20 0600) BP: (96-148)/(45-81) 107/49 (10/20 0600) SpO2:  [81 %-97 %] 93 % (10/20 0600)  PHYSICAL EXAMINATION: General:  Obese male wild mild WOB Neuro:  Alert, oriented, non-focal HEENT:  [place]/AT, PERRL, no JVD Cardiovascular:  RRR, no MRG Lungs:  Clear Abdomen:  Soft, non-tender, non-distended Musculoskeletal:  No acute deformity or ROM limitation Skin:  Grossly intact   Recent Labs Lab 11/08/15 2000 11/09/15 0338 11/10/15 0319  NA 136 135 137  K 4.1 3.4* 3.8  CL 105 105 107  CO2 22 23 24   BUN 12 11 10   CREATININE 0.90 0.90 0.85  GLUCOSE 86 118* 135*    Recent Labs Lab 11/08/15 2000 11/09/15 0338 11/10/15 0319  HGB 9.6* 8.8* 9.1*  HCT 30.5* 27.7* 28.8*  WBC 10.3 8.6 7.7  PLT 402* 382 395   Ct Angio Chest Pe W And/or Wo Contrast  Result Date: 11/08/2015 CLINICAL DATA:  Acute onset of worsening shortness of breath. Current history of metastatic cancer within the lung and liver. Initial encounter. EXAM: CT ANGIOGRAPHY CHEST WITH CONTRAST TECHNIQUE: Multidetector CT imaging of  the chest was performed using the standard protocol during bolus administration of intravenous contrast. Multiplanar CT image reconstructions and MIPs were obtained to evaluate the vascular anatomy. CONTRAST:  100 mL of Isovue 370 IV contrast COMPARISON:  CT of the abdomen and pelvis from  11/06/2015, and CTA of the chest performed 07/24/2015 FINDINGS: Cardiovascular: Pulmonary embolus is noted within the right main pulmonary artery, extending into all lobes of the right lung, and also within the left main pulmonary artery, extending into all lobes of the left lung. The RV/LV ratio of 1.1 corresponds to right heart strain and concern for submassive pulmonary embolus. Calcification is noted at the aortic and mitral valves. Diffuse coronary artery calcifications are seen. Scattered calcification is seen along the aortic arch and descending thoracic aorta. The great vessels are grossly unremarkable in appearance. Mediastinum/Nodes: A prominent subcarinal node is noted, 1.4 cm in short axis. No pericardial effusion is seen. The thyroid gland is unremarkable in appearance. No axillary lymphadenopathy is appreciated. Lungs/Pleura: Scattered bilateral pulmonary nodules are seen, predominantly at the right lower lobe, the largest of which measures 4.9 cm in size. Underlying trace bilateral pleural effusions are noted, with bibasilar opacities likely reflecting atelectasis. A 5 mm pulmonary nodule is noted at the right upper lobe (image 37 of 92). A pulmonary nodule is also noted at the left upper lobe (image 20 of 92). No pneumothorax is seen. Upper Abdomen: Innumerable hypodense lesions are seen throughout the liver, compatible with metastatic disease. The visualized portions of the spleen are unremarkable. Moderate volume ascites is seen at the upper abdomen. Irregular wall thickening is noted at the patient's hiatal hernia, which could conceivably reflect underlying mass, or intraluminal contents. Musculoskeletal: No acute osseous abnormalities are identified. Mild degenerative change is noted at the lower cervical and upper thoracic spine. The visualized musculature is unremarkable in appearance. Review of the MIP images confirms the above findings. IMPRESSION: 1. Pulmonary embolus noted within both main  pulmonary arteries, extending into all lobes of both lungs. Positive for acute PE with CT evidence of right heart strain (RV/LV Ratio = 1.1) consistent with at least submassive (intermediate risk) PE. The presence of right heart strain has been associated with an increased risk of morbidity and mortality. Please activate Code PE by paging [phone]. 2. 4.9 cm mass at the right lower lobe. Scattered additional pulmonary nodules noted bilaterally, most prominent at the right lower lobe. On correlation with prior studies, this may reflect a bronchogenic primary malignancy with associated metastases. 3. Innumerable metastases noted throughout the liver. 4. Prominent subcarinal node, measuring 1.4 cm in short axis common may reflect metastatic disease. 5. Diffuse coronary artery calcifications seen. 6. Moderate ascites at the upper abdomen. 7. Irregular wall thickening again noted at the hiatal hernia. This could reflect underlying mass, given clinical concern, or simply intraluminal contents. However, it appears to be new from July, whereas the right lower lobe lung mass has gradually increased in size, and thus primary malignancy at the hiatal hernia is considered less likely. Critical Value/emergent results were called by telephone at the time of interpretation on 11/08/2015 at 9:21 pm to Dr. Shirlyn Goltz, who verbally acknowledged these results. Electronically Signed   By: Garald Balding M.D.   On: 11/08/2015 21:53   US Paracentesis  Result Date: 11/09/2015 INDICATION: Patient with history of PE, remote melanoma, liver/lung lesions, cecal mass, ascites. Request made for diagnostic and therapeutic paracentesis. EXAM: ULTRASOUND GUIDED DIAGNOSTIC AND THERAPEUTIC PARACENTESIS MEDICATIONS: None. COMPLICATIONS: None immediate. PROCEDURE: Informed written  consent was obtained from the patient after a discussion of the risks, benefits and alternatives to treatment. A timeout was performed prior to the initiation of the  procedure. Initial ultrasound scanning demonstrates a moderate-to-large amount of ascites within the right lower abdominal quadrant. The right lower abdomen was prepped and draped in the usual sterile fashion. 1% lidocaine was used for local anesthesia. Following this, a Yueh catheter was introduced. An ultrasound image was saved for documentation purposes. The paracentesis was performed. The catheter was removed and a dressing was applied. The patient tolerated the procedure well without immediate post procedural complication. FINDINGS: A total of approximately 4.8 liters of yellow fluid was removed. Samples were sent to the laboratory as requested by the clinical team. IMPRESSION: Successful ultrasound-guided diagnostic and therapeutic paracentesis yielding 4.8 liters of peritoneal fluid. Read by: Rowe Sulema Braid, PA-C Electronically Signed   By: Jerilynn Mages.  Shick M.D.   On: 11/09/2015 12:30   Dg Chest Port 1 View  Result Date: 11/08/2015 CLINICAL DATA:  Shortness of breath worsening today, history of cancer of the lung, colon and liver, diabetes mellitus, hypertension, melanoma EXAM: PORTABLE CHEST 1 VIEW COMPARISON:  Portable exam 1900 hours compared to 07/24/2015 FINDINGS: Normal heart size, mediastinal contours, and pulmonary vascularity. Atherosclerotic calcification aorta. Again identified peripheral opacity at the lower RIGHT chest question atelectasis versus scarring unchanged. Small focus of gas at the inferior RIGHT chest projects beyond the costal margin, unchanged since prior chest radiograph but not localized on CT. RIGHT infrahilar opacity corresponding to rounded opacity on prior CT again seen. Remaining lungs clear. No pleural effusion or pneumothorax. IMPRESSION: Chronic opacity at the lateral lower RIGHT chest may represent scarring or chronic atelectasis, less likely chronic infiltrate. Rounded RIGHT infrahilar opacity appears unchanged since prior CT, was of fluid attenuation on prior CT question  loculated fluid versus necrotic nodule. Aortic atherosclerosis. Remainder of exam unremarkable. Electronically Signed   By: Lavonia Dana M.D.   On: 11/08/2015 19:41    ASSESSMENT / PLAN:  Bilateral pulmonary emboli without hemodynamic compromise RLE DVT - enoxaparin as ordered, hopefully he will be able to tolerate given his GI blood loss - oral anti-coag vs enoxaparin long term, again as he can tolerate  - Supplemental O2 to keep sats > 92% - No indication for thrombolysis, relative contraindication cecal mass. MRI brain for possible mets not yet done.   Metastatic Cancer / Pulmonary nodules. Probable mets as opposed to primary lung CA based on distribution and appearance of his pulmonary nodules, but both possible - if it is deemed necessary to plan chemotherapy, etc, then IR TTNA biopsy of the largest RLL nodule would be procedure of choice. Will defer to Dr Burr Medico as to whether this would change cancer management.     Baltazar Apo, MD, PhD 11/10/2015, 8:57 AM [facility] [phone] or if no answer [phone]

## 2015-11-10 NOTE — Progress Notes (Signed)
PROGRESS NOTE  Martin Leblanc  [id number] DOB: April 13, 1929 DOA: 11/08/2015 PCP: Sheela Stack, MD  Brief Narrative:   Martin Leblanc is a 82 y.o. gentleman with a history melanoma affecting his left eye, newly diagnosed metastatic adenocarcinoma likely colon primary, HTN, DM, and GERD who presented to the ED with progressive SOB.  He was found to have submassive PE, probable malignant ascites and lung mets.  He has an acute RLE DVT.  He was also in a-fib with RVR.  He was started on lovenox.  He had a paracentesis with removal of 4.8L of cloudy fluid on 10/19, however, cytology was nondiagnostic.  He will need a CT-guided omental biopsy by radiology while on full dose anticoagulation.  This can be performed on Monday or as an outpatient depending on his progression.  Dr. Burr Medico from oncology has also requested additional tests be performed on the pathology specimen obtained by [name] from his colonoscopy.  Awaiting PT evaluation.  Will need oxygen at time of discharge.    Assessment & Plan:   Principal Problem:   Pulmonary embolus (HCC) Active Problems:   Dyspnea   Ascites   Atrial fibrillation with RVR (HCC)   Lung mass   Mass of cecum   Liver metastases (HCC)   Acute respiratory failure with hypoxia (HCC)   Aortic stenosis   PE (pulmonary thromboembolism) (HCC)   Metastatic cancer (HCC)  Submassive PE with acute hypoxia and hemodynamic instability, acute RIGHT lower extremity DVT. -- Continue Lovenox --Echo:  Moderate LVH, preserved EF, no evidence of right heart strain.   --Troponin negative --Lower extremity ultrasound pending --Strict bedrest until patient has been on adequate anticoagulation for at least 24 hours --Supplemental oxygen as needed  Paroxysmal a fib, CHADS-Vasc 4 --Likely provoked by hypoxia and acute PE --Anticoagulated with Lovenox --TSH within normal limits --Echo without regional wall motion abnl.  Does have AVS.    Moderate aortic valve stenosis -   Follow up with cardiology   Metastatic adenocarcinoma, likely colon primary -- Appreciate Dr. Ernestina Penna assistance  Ascites, likely malignant based on CT scan -  Paracentesis performed with removal of 4.8L of yellow fluid on 10/19 -  F/u cytology final report  HTN --hold ARB due to relative hypotension  GERD --PPI  History of BPH --Continue home meds  DM type 2, A1c 8.3, CBG well controlled --Low carb diet, SSI  Iron deficiency anemia due to metastatic cancer -  B12, folate wnl -  feraheme x 1 -  Increase oral iron to twice daily  Hypokalemia, given oral potassium supplementation -  Check potassium in a.m.  DVT prophylaxis:  Lovenox  Code Status:  Full code Family Communication:  Patient alone Disposition Plan:  PT evaluation and possible omental biopsy on Monday.  Radiology did not have time today to perform procedure.    Consultants:   PC CM  Procedures:  Lower extremity duplex on 10/19:  Acute right lower extremity DVT Paracentesis on 10/19:  Removal of 4.8 L of yellow fluid Echocardiogram:  Preserved EF, moderate AVS  Antimicrobials:   None    Subjective: Patient states he is breathing easier.  Abdomen feels better after paracentesis.  Cough improving.   Objective: Vitals:   11/10/15 1000 11/10/15 1100 11/10/15 1200 11/10/15 1246  BP: (!) 117/54 132/65    Pulse: 97 93    Resp: (!) 21 17    Temp:   98.3 F (36.8 C)   TempSrc:   Oral   SpO2: 95%  92%  94%  Weight:      Height:        Intake/Output Summary (Last 24 hours) at 11/10/15 1330 Last data filed at 11/10/15 0930  Gross per 24 hour  Intake              240 ml  Output              975 ml  Net             -735 ml   Filed Weights   11/08/15 1858 11/09/15 0415  Weight: 99.8 kg (220 lb) 101 kg (222 lb 10.6 oz)    Examination:  General exam:  Adult Male. NAD HEENT:  NCAT, MMM Respiratory system: Diminished at the bilateral bases, otherwise clear to auscultation  bilaterally Cardiovascular system: Regular rate and rhythm, normal S1/S2. No murmurs, rubs, gallops or clicks.  Warm extremities Gastrointestinal system: Normal active bowel sounds, severely distended and tense, uncomfortable but not painful with palpation MSK:  Normal tone and bulk, no lower extremity edema Neuro:  Grossly moves all extremities    Data Reviewed: I have personally reviewed following labs and imaging studies  CBC:  Recent Labs Lab 11/08/15 2000 11/09/15 0338 11/10/15 0319  WBC 10.3 8.6 7.7  NEUTROABS 8.3*  --   --   HGB 9.6* 8.8* 9.1*  HCT 30.5* 27.7* 28.8*  MCV 75.3* 74.7* 75.0*  PLT 402* 382 [id number]   Basic Metabolic Panel:  Recent Labs Lab 11/08/15 2000 11/09/15 0315 11/09/15 0338 11/10/15 0319  NA 136  --  135 137  K 4.1  --  3.4* 3.8  CL 105  --  105 107  CO2 22  --  23 24  GLUCOSE 86  --  118* 135*  BUN 12  --  11 10  CREATININE 0.90  --  0.90 0.85  CALCIUM 8.9  --  8.9 8.5*  MG  --  1.7  --   --    GFR: Estimated Creatinine Clearance: 78.3 mL/min (by C-G formula based on SCr of 0.85 mg/dL). Liver Function Tests:  Recent Labs Lab 11/08/15 2000  AST 36  ALT 24  ALKPHOS 104  BILITOT 0.8  PROT 6.9  ALBUMIN 2.9*    Recent Labs Lab 11/08/15 2000  LIPASE 13   No results for input(s): AMMONIA in the last 168 hours. Coagulation Profile:  Recent Labs Lab 11/08/15 1953  INR 1.18   Cardiac Enzymes:  Recent Labs Lab 11/09/15 0315 11/09/15 0642 11/09/15 1315  TROPONINI 0.03* 0.03* 0.03*   BNP (last 3 results) No results for input(s): PROBNP in the last 8760 hours. HbA1C:  Recent Labs  11/08/15 2000  HGBA1C 8.3*   CBG:  Recent Labs Lab 11/09/15 1155 11/09/15 1540 11/09/15 2154 11/10/15 0758 11/10/15 1248  GLUCAP 234* 164* 144* 157* 182*   Lipid Profile: No results for input(s): CHOL, HDL, LDLCALC, TRIG, CHOLHDL, LDLDIRECT in the last 72 hours. Thyroid Function Tests:  Recent Labs  11/09/15 0315  TSH 2.679   FREET4 1.47*   Anemia Panel:  Recent Labs  11/10/15 0319  VITAMINB12 1,144*  FOLATE 13.3   Urine analysis:    Component Value Date/Time   COLORURINE YELLOW 07/25/2015 1257   APPEARANCEUR TURBID (A) 07/25/2015 1257   LABSPEC 1.019 07/25/2015 1257   PHURINE 6.5 07/25/2015 1257   GLUCOSEU 500 (A) 07/25/2015 1257   HGBUR NEGATIVE 07/25/2015 1257   BILIRUBINUR NEGATIVE 07/25/2015 1257   KETONESUR NEGATIVE 07/25/2015 1257   PROTEINUR  NEGATIVE 07/25/2015 1257   NITRITE NEGATIVE 07/25/2015 1257   LEUKOCYTESUR MODERATE (A) 07/25/2015 1257   Sepsis Labs: @LABRCNTIP (procalcitonin:4,lacticidven:4)  ) Recent Results (from the past 240 hour(s))  MRSA PCR Screening     Status: None   Collection Time: 11/09/15  4:20 AM  Result Value Ref Range Status   MRSA by PCR NEGATIVE NEGATIVE Final    Comment:        The GeneXpert MRSA Assay (FDA approved for NASAL specimens only), is one component of a comprehensive MRSA colonization surveillance program. It is not intended to diagnose MRSA infection nor to guide or monitor treatment for MRSA infections.   Body fluid culture     Status: None (Preliminary result)   Collection Time: 11/09/15 10:45 AM  Result Value Ref Range Status   Specimen Description FLUID ASCITES  Final   Special Requests NONE  Final   Gram Stain   Final    FEW WBC PRESENT, PREDOMINANTLY MONONUCLEAR NO ORGANISMS SEEN    Culture   Final    NO GROWTH 1 DAY Performed at [facility]    Report Status PENDING  Incomplete      Radiology Studies: Ct Angio Chest Pe W And/or Wo Contrast  Result Date: 11/08/2015 CLINICAL DATA:  Acute onset of worsening shortness of breath. Current history of metastatic cancer within the lung and liver. Initial encounter. EXAM: CT ANGIOGRAPHY CHEST WITH CONTRAST TECHNIQUE: Multidetector CT imaging of the chest was performed using the standard protocol during bolus administration of intravenous contrast. Multiplanar CT image  reconstructions and MIPs were obtained to evaluate the vascular anatomy. CONTRAST:  100 mL of Isovue 370 IV contrast COMPARISON:  CT of the abdomen and pelvis from 11/06/2015, and CTA of the chest performed 07/24/2015 FINDINGS: Cardiovascular: Pulmonary embolus is noted within the right main pulmonary artery, extending into all lobes of the right lung, and also within the left main pulmonary artery, extending into all lobes of the left lung. The RV/LV ratio of 1.1 corresponds to right heart strain and concern for submassive pulmonary embolus. Calcification is noted at the aortic and mitral valves. Diffuse coronary artery calcifications are seen. Scattered calcification is seen along the aortic arch and descending thoracic aorta. The great vessels are grossly unremarkable in appearance. Mediastinum/Nodes: A prominent subcarinal node is noted, 1.4 cm in Sheron Robin axis. No pericardial effusion is seen. The thyroid gland is unremarkable in appearance. No axillary lymphadenopathy is appreciated. Lungs/Pleura: Scattered bilateral pulmonary nodules are seen, predominantly at the right lower lobe, the largest of which measures 4.9 cm in size. Underlying trace bilateral pleural effusions are noted, with bibasilar opacities likely reflecting atelectasis. A 5 mm pulmonary nodule is noted at the right upper lobe (image 37 of 92). A pulmonary nodule is also noted at the left upper lobe (image 20 of 92). No pneumothorax is seen. Upper Abdomen: Innumerable hypodense lesions are seen throughout the liver, compatible with metastatic disease. The visualized portions of the spleen are unremarkable. Moderate volume ascites is seen at the upper abdomen. Irregular wall thickening is noted at the patient's hiatal hernia, which could conceivably reflect underlying mass, or intraluminal contents. Musculoskeletal: No acute osseous abnormalities are identified. Mild degenerative change is noted at the lower cervical and upper thoracic spine. The  visualized musculature is unremarkable in appearance. Review of the MIP images confirms the above findings. IMPRESSION: 1. Pulmonary embolus noted within both main pulmonary arteries, extending into all lobes of both lungs. Positive for acute PE with CT evidence of  right heart strain (RV/LV Ratio = 1.1) consistent with at least submassive (intermediate risk) PE. The presence of right heart strain has been associated with an increased risk of morbidity and mortality. Please activate Code PE by paging [phone]. 2. 4.9 cm mass at the right lower lobe. Scattered additional pulmonary nodules noted bilaterally, most prominent at the right lower lobe. On correlation with prior studies, this may reflect a bronchogenic primary malignancy with associated metastases. 3. Innumerable metastases noted throughout the liver. 4. Prominent subcarinal node, measuring 1.4 cm in Quinterrius Errington axis common may reflect metastatic disease. 5. Diffuse coronary artery calcifications seen. 6. Moderate ascites at the upper abdomen. 7. Irregular wall thickening again noted at the hiatal hernia. This could reflect underlying mass, given clinical concern, or simply intraluminal contents. However, it appears to be new from July, whereas the right lower lobe lung mass has gradually increased in size, and thus primary malignancy at the hiatal hernia is considered less likely. Critical Value/emergent results were called by telephone at the time of interpretation on 11/08/2015 at 9:21 pm to Dr. Shirlyn Goltz, who verbally acknowledged these results. Electronically Signed   By: Garald Balding M.D.   On: 11/08/2015 21:53   Mr Martin Leblanc [id number] Contrast  Result Date: 11/10/2015 CLINICAL DATA:  82 year old male with metastatic melanoma. Acute pulmonary emboli. Left ocular melanoma status post brachytherapy. Staging. Subsequent encounter. EXAM: MRI HEAD WITHOUT AND WITH CONTRAST TECHNIQUE: Multiplanar, multiecho pulse sequences of the brain and surrounding structures  were obtained without and with intravenous contrast. CONTRAST:  83mL MULTIHANCE GADOBENATE DIMEGLUMINE 529 MG/ML IV SOLN COMPARISON:  Chest CTA 11/08/2015. FINDINGS: Brain: Evidence of intravenous iron or other diamagnetic intravascular substance simulating the appearance of contrast even on the noncontrast portion of the exam, and degrading T2* imaging. Still, there is no evidence of intracranial mass or abnormal enhancement. No intracranial mass effect. No evidence of dural thickening. Right anterior superior frontal gyrus cortical encephalomalacia with mild white matter gliosis. Small areas of cortical encephalomalacia also in the anterior left superior frontal gyrus (series 7, image 20) and in the left posterior occipital lobe (series 6, image 9). Elsewhere minimal to mild for age nonspecific cerebral white matter T2 and FLAIR hyperintensity. Deep gray matter nuclei and brainstem are within normal limits. There are probably several tiny chronic lacunar infarcts in both cerebellar hemispheres. No restricted diffusion to suggest acute infarction. No midline shift, mass effect, ventriculomegaly, extra-axial collection or acute intracranial hemorrhage. Cervicomedullary junction and pituitary are within normal limits. Vascular: Major intracranial vascular flow voids are preserved, dominant distal left vertebral artery. Skull and upper cervical spine: Negative visualized cervical spinal cord. There is heterogeneous bone marrow signal throughout the skull and upper cervical spine, but no destructive osseous lesion identified. Sinuses/Orbits: Abnormal left globe with 10 mm decreased T2 signal and increased T1 or enhancing signal (series 6, image 8). The other left intraorbital soft tissues remain normal. The right orbit appears normal. Mild paranasal sinus mucosal thickening. Other: Visible internal auditory structures appear normal. Mastoids are clear. Negative scalp soft tissues. IMPRESSION: 1. Mild artifact related  to intravenous iron or other diet magnetic intravascular substance. However, the study is diagnostic with No metastatic disease to the brain or acute intracranial abnormality. 2. A 10 mm left globe mass is compatible with the stated history of left orbital melanoma involvement. 3. Chronic ischemic cerebral disease in the right greater than left anterior MCA territories and left PCA territory. Electronically Signed   By: Herminio Heads.D.  On: 11/10/2015 12:56   US Paracentesis  Result Date: 11/09/2015 INDICATION: Patient with history of PE, remote melanoma, liver/lung lesions, cecal mass, ascites. Request made for diagnostic and therapeutic paracentesis. EXAM: ULTRASOUND GUIDED DIAGNOSTIC AND THERAPEUTIC PARACENTESIS MEDICATIONS: None. COMPLICATIONS: None immediate. PROCEDURE: Informed written consent was obtained from the patient after a discussion of the risks, benefits and alternatives to treatment. A timeout was performed prior to the initiation of the procedure. Initial ultrasound scanning demonstrates a moderate-to-large amount of ascites within the right lower abdominal quadrant. The right lower abdomen was prepped and draped in the usual sterile fashion. 1% lidocaine was used for local anesthesia. Following this, a Yueh catheter was introduced. An ultrasound image was saved for documentation purposes. The paracentesis was performed. The catheter was removed and a dressing was applied. The patient tolerated the procedure well without immediate post procedural complication. FINDINGS: A total of approximately 4.8 liters of yellow fluid was removed. Samples were sent to the laboratory as requested by the clinical team. IMPRESSION: Successful ultrasound-guided diagnostic and therapeutic paracentesis yielding 4.8 liters of peritoneal fluid. Read by: Rowe Robert, PA-C Electronically Signed   By: Jerilynn Mages.  Shick M.D.   On: 11/09/2015 12:30   Dg Chest Port 1 View  Result Date: 11/08/2015 CLINICAL DATA:  Shortness of  breath worsening today, history of cancer of the lung, colon and liver, diabetes mellitus, hypertension, melanoma EXAM: PORTABLE CHEST 1 VIEW COMPARISON:  Portable exam 1900 hours compared to 07/24/2015 FINDINGS: Normal heart size, mediastinal contours, and pulmonary vascularity. Atherosclerotic calcification aorta. Again identified peripheral opacity at the lower RIGHT chest question atelectasis versus scarring unchanged. Small focus of gas at the inferior RIGHT chest projects beyond the costal margin, unchanged since prior chest radiograph but not localized on CT. RIGHT infrahilar opacity corresponding to rounded opacity on prior CT again seen. Remaining lungs clear. No pleural effusion or pneumothorax. IMPRESSION: Chronic opacity at the lateral lower RIGHT chest may represent scarring or chronic atelectasis, less likely chronic infiltrate. Rounded RIGHT infrahilar opacity appears unchanged since prior CT, was of fluid attenuation on prior CT question loculated fluid versus necrotic nodule. Aortic atherosclerosis. Remainder of exam unremarkable. Electronically Signed   By: Lavonia Dana M.D.   On: 11/08/2015 19:41     Scheduled Meds: . atorvastatin  20 mg Oral Daily  . clotrimazole   Topical BID  . darifenacin  15 mg Oral Daily  . doxazosin  4 mg Oral Daily  . enoxaparin (LOVENOX) injection  100 mg Subcutaneous BID  . ferrous sulfate  325 mg Oral BID WC  . insulin aspart  0-15 Units Subcutaneous TID WC  . losartan  100 mg Oral Daily  . pantoprazole  40 mg Oral Daily  . sodium chloride flush  3 mL Intravenous Q12H   Continuous Infusions:    LOS: 2 days    Time spent: 30 min    Janece Canterbury, MD [facility] Pager [phone]  If 7PM-7AM, please contact night-coverage [email or web address] Password [email or web address] 11/10/2015, 1:30 PM

## 2015-11-10 NOTE — Telephone Encounter (Signed)
VM from daughter, Marlowe Sax asking to speak w/Dr. Burr Medico regarding what is the next step/treatment plan for her father? Please call her at cell [phone] or work [phone] if she is not able to catch her in the room today when she rounds. Message to MD desk.

## 2015-11-10 NOTE — Progress Notes (Signed)
IR received request for biopsy of peritoneal lesions. Cannot accommodate procedure on today's schedule.  Recent dx metastatic adenocarcinoma, need staging bx or confirmation of metastatic pathology. Had paracentesis yesterday, cytology pending.  Now on therapeutic Lovenox for Bilat PE/DVT  Reviewed imaging with Dr. Kathlene Cote, lowest risk biopsy would be peritoneal mass (vs liver or lung lesions), however, peritoneal/omental biopsy still has significant risk of bleed, especially on anticoag. Ideally, favor switching to heparin gtt through weekend, await final cytology report. Can tentatively try for CT guided biopsy on Mon if heparin is able to be held for 3 hrs prior to procedure. D/W attending, Dr. Sheran Fava.   Ascencion Dike PA-C Interventional Radiology 11/10/2015 2:36 PM

## 2015-11-10 NOTE — Progress Notes (Signed)
Initial Nutrition Assessment  DOCUMENTATION CODES:   Obesity unspecified  INTERVENTION:  - Will order Ensure Enlive po BID, each supplement provides 350 kcal and 20 grams of protein - Continue to encourage PO intakes of meals and supplements.  - RD will follow-up on 10/23.  NUTRITION DIAGNOSIS:   Increased nutrient needs related to catabolic illness, cancer and cancer related treatments as evidenced by estimated needs.  GOAL:   Patient will meet greater than or equal to 90% of their needs  MONITOR:   PO intake, Supplement acceptance, Weight trends, Labs, I & O's  REASON FOR ASSESSMENT:   Malnutrition Screening Tool  ASSESSMENT:   82 y.o. 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  He was brought in by EMS for progressive SOB. In July he had admission to [facility] for chest pain and SOB.  He was found to have iron deficiency anemia, and his symptoms were attributed to this.  A CTA of the chest at that time was negative for PE but showed a pulmonary nodule, and follow-up imaging was recommended. Echo in July also showed grade II diastolic dysfunction, and mod to severe AS with a valve area of 0.95cm2.  He ultimately followed up in clinic with both cardiology and pulmonary, but no alternative causes for his symptoms were identified.  He saw his urologist in late August for LUTS and a bladder mass was identified; however, biopsies from this lesion were reportedly benign.  He continued to have shortness of breath, and decreased appetite (though he denies significant weight loss).  He requested colonoscopy by his GI provider, which revealed a mass at the cecum.  He was referred to Dr. Ninfa Linden for possible hemicolectomy, but repeat imaging of his abdomen and pelvis on 10/16 showed progression in size of the RLL lung mass concerning for metastatic disease, innumerable liver lesions concerning for mets, focal thickening of the gastric wall, irregular thickening of the  cecal wall, and ascites. He was no longer considered a surgical candidate and was referred to oncology. In the ED he was found to have mild hypoxia with new O2 requirement. He was also found to be in atrial fibrillation with RVR.  Pt seen for MST. BMI indicates obesity. No intakes documented since admission. Unable to see pt x3 attempted visits today and pt's daughter currently having in depth discussion with PT. Pt working with PT at time of second attempted visit and walked in the hallway. Notes indicates pt with poor appetite PTA. Will order Ensure Enlive to supplement and will follow-up on Monday for full assessment and adjust interventions as warranted at that time.   Unable to complete physical assessment at this time and will complete at time of follow-up. Per chart review, pt has lost 7 lbs (3% body weight) in the past 1.5-2 months which is not significant for time frame.   Medications reviewed; PRN Colace, sliding scale Novolog, PRN Zofran, 40 mg Protonix/day.  Labs reviewed; 157 and 182 mg/dL today, Ca: 8.5 mg/dL.   Diet Order:  Diet heart healthy/carb modified Room service appropriate? Yes; Fluid consistency: Thin  Skin:  Reviewed, no issues  Last BM:  10/18  Height:   Ht Readings from Last 1 Encounters:  11/09/15 5\' 11"  (1.803 m)    Weight:   Wt Readings from Last 1 Encounters:  11/09/15 222 lb 10.6 oz (101 kg)    Ideal Body Weight:  78.18 kg  BMI:  Body mass index is 31.06 kg/m.  Estimated  Nutritional Needs:   Kcal:  2020-2220 (20-22 kcal/kg)  Protein:  90-100 grams  Fluid:  >/= 2 L/day  EDUCATION NEEDS:   No education needs identified at this time    Jarome Matin, MS, RD, LDN Inpatient Clinical Dietitian Pager # [phone] After hours/weekend pager # [phone]

## 2015-11-10 NOTE — Progress Notes (Signed)
Martin Leblanc   DOB:Jan 21, 1930   YY#:[id number]   WUG#:[id number]  Oncology follow up  Subjective: I met pt and her daughters in the late afternoon. He overall feels better, walked in the hallway. Heparrin drip was changed to Lovenox.   Objective:  Vitals:   11/10/15 1445 11/10/15 1551  BP:  110/60  Pulse: (!) 114 (!) 101  Resp:  18  Temp:  98.2 F (36.8 C)    Body mass index is 31.06 kg/m.  Intake/Output Summary (Last 24 hours) at 11/10/15 2102 Last data filed at 11/10/15 1800  Gross per 24 hour  Intake              240 ml  Output              875 ml  Net             -635 ml     Sclerae unicteric  Oropharynx clear  No peripheral adenopathy  Lungs clear -- no rales or rhonchi  Heart regular rate and rhythm  Abdomen soft, non-tender, (+) ascites   MSK no focal spinal tenderness, no peripheral edema  Neuro nonfocal    CBG (last 3)   Recent Labs  11/10/15 0758 11/10/15 1248 11/10/15 1630  GLUCAP 157* 182* 226*     Labs:  Lab Results  Component Value Date   WBC 7.7 11/10/2015   HGB 9.1 (L) 11/10/2015   HCT 28.8 (L) 11/10/2015   MCV 75.0 (L) 11/10/2015   PLT 395 11/10/2015   NEUTROABS 8.3 (H) 11/08/2015   CMP Latest Ref Rng & Units 11/10/2015 11/09/2015 11/08/2015  Glucose 65 - 99 mg/dL 135(H) 118(H) 86  BUN 6 - 20 mg/dL 10 11 12   Creatinine 0.61 - 1.24 mg/dL 0.85 0.90 0.90  Sodium 135 - 145 mmol/L 137 135 136  Potassium 3.5 - 5.1 mmol/L 3.8 3.4(L) 4.1  Chloride 101 - 111 mmol/L 107 105 105  CO2 22 - 32 mmol/L 24 23 22   Calcium 8.9 - 10.3 mg/dL 8.5(L) 8.9 8.9  Total Protein 6.5 - 8.1 g/dL - - 6.9  Total Bilirubin 0.3 - 1.2 mg/dL - - 0.8  Alkaline Phos 38 - 126 U/L - - 104  AST 15 - 41 U/L - - 36  ALT 17 - 63 U/L - - 24    Urine Studies No results for input(s): UHGB, CRYS in the last 72 hours.  Invalid input(s): UACOL, UAPR, USPG, UPH, UTP, UGL, UKET, UBIL, UNIT, UROB, ULEU, UEPI, UWBC, URBC, UBAC, CAST, [place], [place]  Basic Metabolic Panel:  Recent  Labs Lab 11/08/15 2000 11/09/15 0315 11/09/15 0338 11/10/15 0319  NA 136  --  135 137  K 4.1  --  3.4* 3.8  CL 105  --  105 107  CO2 22  --  23 24  GLUCOSE 86  --  118* 135*  BUN 12  --  11 10  CREATININE 0.90  --  0.90 0.85  CALCIUM 8.9  --  8.9 8.5*  MG  --  1.7  --   --    GFR Estimated Creatinine Clearance: 78.3 mL/min (by C-G formula based on SCr of 0.85 mg/dL). Liver Function Tests:  Recent Labs Lab 11/08/15 2000  AST 36  ALT 24  ALKPHOS 104  BILITOT 0.8  PROT 6.9  ALBUMIN 2.9*    Recent Labs Lab 11/08/15 2000  LIPASE 13   No results for input(s): AMMONIA in the last 168 hours. Coagulation profile  Recent Labs  Lab 11/08/15 1953  INR 1.18    CBC:  Recent Labs Lab 11/08/15 2000 11/09/15 0338 11/10/15 0319  WBC 10.3 8.6 7.7  NEUTROABS 8.3*  --   --   HGB 9.6* 8.8* 9.1*  HCT 30.5* 27.7* 28.8*  MCV 75.3* 74.7* 75.0*  PLT 402* 382 395   Cardiac Enzymes:  Recent Labs Lab 11/09/15 0315 11/09/15 0642 11/09/15 1315  TROPONINI 0.03* 0.03* 0.03*   BNP: Invalid input(s): POCBNP CBG:  Recent Labs Lab 11/09/15 1540 11/09/15 2154 11/10/15 0758 11/10/15 1248 11/10/15 1630  GLUCAP 164* 144* 157* 182* 226*   D-Dimer No results for input(s): DDIMER in the last 72 hours. Hgb A1c  Recent Labs  11/08/15 2000  HGBA1C 8.3*   Lipid Profile No results for input(s): CHOL, HDL, LDLCALC, TRIG, CHOLHDL, LDLDIRECT in the last 72 hours. Thyroid function studies  Recent Labs  11/09/15 0315  TSH 2.679   Anemia work up  Recent Labs  11/10/15 0319  VITAMINB12 1,144*  FOLATE 13.3   Microbiology Recent Results (from the past 240 hour(s))  MRSA PCR Screening     Status: None   Collection Time: 11/09/15  4:20 AM  Result Value Ref Range Status   MRSA by PCR NEGATIVE NEGATIVE Final    Comment:        The GeneXpert MRSA Assay (FDA approved for NASAL specimens only), is one component of a comprehensive MRSA colonization surveillance  program. It is not intended to diagnose MRSA infection nor to guide or monitor treatment for MRSA infections.   Body fluid culture     Status: None (Preliminary result)   Collection Time: 11/09/15 10:45 AM  Result Value Ref Range Status   Specimen Description FLUID ASCITES  Final   Special Requests NONE  Final   Gram Stain   Final    FEW WBC PRESENT, PREDOMINANTLY MONONUCLEAR NO ORGANISMS SEEN    Culture   Final    NO GROWTH 1 DAY Performed at [facility]    Report Status PENDING  Incomplete   CYTOLOGY  Diagnosis PERITONEAL/ASCITIC FLUID (SPECIMEN 1 OF 1 COLLECTED 11/09/15): ATYPICAL CELLS PRESENT, SEE COMMENT.  Comment There are rare atypical cells present. However, additonal tissue sampling will be helpful for a definitive diagnosis  Studies:  Ct Angio Chest Pe W And/or Wo Contrast  Result Date: 11/08/2015 CLINICAL DATA:  Acute onset of worsening shortness of breath. Current history of metastatic cancer within the lung and liver. Initial encounter. EXAM: CT ANGIOGRAPHY CHEST WITH CONTRAST TECHNIQUE: Multidetector CT imaging of the chest was performed using the standard protocol during bolus administration of intravenous contrast. Multiplanar CT image reconstructions and MIPs were obtained to evaluate the vascular anatomy. CONTRAST:  100 mL of Isovue 370 IV contrast COMPARISON:  CT of the abdomen and pelvis from 11/06/2015, and CTA of the chest performed 07/24/2015 FINDINGS: Cardiovascular: Pulmonary embolus is noted within the right main pulmonary artery, extending into all lobes of the right lung, and also within the left main pulmonary artery, extending into all lobes of the left lung. The RV/LV ratio of 1.1 corresponds to right heart strain and concern for submassive pulmonary embolus. Calcification is noted at the aortic and mitral valves. Diffuse coronary artery calcifications are seen. Scattered calcification is seen along the aortic arch and descending thoracic  aorta. The great vessels are grossly unremarkable in appearance. Mediastinum/Nodes: A prominent subcarinal node is noted, 1.4 cm in short axis. No pericardial effusion is seen. The thyroid gland is unremarkable in appearance.  No axillary lymphadenopathy is appreciated. Lungs/Pleura: Scattered bilateral pulmonary nodules are seen, predominantly at the right lower lobe, the largest of which measures 4.9 cm in size. Underlying trace bilateral pleural effusions are noted, with bibasilar opacities likely reflecting atelectasis. A 5 mm pulmonary nodule is noted at the right upper lobe (image 37 of 92). A pulmonary nodule is also noted at the left upper lobe (image 20 of 92). No pneumothorax is seen. Upper Abdomen: Innumerable hypodense lesions are seen throughout the liver, compatible with metastatic disease. The visualized portions of the spleen are unremarkable. Moderate volume ascites is seen at the upper abdomen. Irregular wall thickening is noted at the patient's hiatal hernia, which could conceivably reflect underlying mass, or intraluminal contents. Musculoskeletal: No acute osseous abnormalities are identified. Mild degenerative change is noted at the lower cervical and upper thoracic spine. The visualized musculature is unremarkable in appearance. Review of the MIP images confirms the above findings. IMPRESSION: 1. Pulmonary embolus noted within both main pulmonary arteries, extending into all lobes of both lungs. Positive for acute PE with CT evidence of right heart strain (RV/LV Ratio = 1.1) consistent with at least submassive (intermediate risk) PE. The presence of right heart strain has been associated with an increased risk of morbidity and mortality. Please activate Code PE by paging [phone]. 2. 4.9 cm mass at the right lower lobe. Scattered additional pulmonary nodules noted bilaterally, most prominent at the right lower lobe. On correlation with prior studies, this may reflect a bronchogenic primary  malignancy with associated metastases. 3. Innumerable metastases noted throughout the liver. 4. Prominent subcarinal node, measuring 1.4 cm in short axis common may reflect metastatic disease. 5. Diffuse coronary artery calcifications seen. 6. Moderate ascites at the upper abdomen. 7. Irregular wall thickening again noted at the hiatal hernia. This could reflect underlying mass, given clinical concern, or simply intraluminal contents. However, it appears to be new from July, whereas the right lower lobe lung mass has gradually increased in size, and thus primary malignancy at the hiatal hernia is considered less likely. Critical Value/emergent results were called by telephone at the time of interpretation on 11/08/2015 at 9:21 pm to Dr. Shirlyn Goltz, who verbally acknowledged these results. Electronically Signed   By: Garald Balding M.D.   On: 11/08/2015 21:53   Mr Jeri Cos JJ Contrast  Result Date: 11/10/2015 CLINICAL DATA:  82 year old male with metastatic melanoma. Acute pulmonary emboli. Left ocular melanoma status post brachytherapy. Staging. Subsequent encounter. EXAM: MRI HEAD WITHOUT AND WITH CONTRAST TECHNIQUE: Multiplanar, multiecho pulse sequences of the brain and surrounding structures were obtained without and with intravenous contrast. CONTRAST:  81m MULTIHANCE GADOBENATE DIMEGLUMINE 529 MG/ML IV SOLN COMPARISON:  Chest CTA 11/08/2015. FINDINGS: Brain: Evidence of intravenous iron or other diamagnetic intravascular substance simulating the appearance of contrast even on the noncontrast portion of the exam, and degrading T2* imaging. Still, there is no evidence of intracranial mass or abnormal enhancement. No intracranial mass effect. No evidence of dural thickening. Right anterior superior frontal gyrus cortical encephalomalacia with mild white matter gliosis. Small areas of cortical encephalomalacia also in the anterior left superior frontal gyrus (series 7, image 20) and in the left posterior  occipital lobe (series 6, image 9). Elsewhere minimal to mild for age nonspecific cerebral white matter T2 and FLAIR hyperintensity. Deep gray matter nuclei and brainstem are within normal limits. There are probably several tiny chronic lacunar infarcts in both cerebellar hemispheres. No restricted diffusion to suggest acute infarction. No midline shift, mass effect,  ventriculomegaly, extra-axial collection or acute intracranial hemorrhage. Cervicomedullary junction and pituitary are within normal limits. Vascular: Major intracranial vascular flow voids are preserved, dominant distal left vertebral artery. Skull and upper cervical spine: Negative visualized cervical spinal cord. There is heterogeneous bone marrow signal throughout the skull and upper cervical spine, but no destructive osseous lesion identified. Sinuses/Orbits: Abnormal left globe with 10 mm decreased T2 signal and increased T1 or enhancing signal (series 6, image 8). The other left intraorbital soft tissues remain normal. The right orbit appears normal. Mild paranasal sinus mucosal thickening. Other: Visible internal auditory structures appear normal. Mastoids are clear. Negative scalp soft tissues. IMPRESSION: 1. Mild artifact related to intravenous iron or other diet magnetic intravascular substance. However, the study is diagnostic with No metastatic disease to the brain or acute intracranial abnormality. 2. A 10 mm left globe mass is compatible with the stated history of left orbital melanoma involvement. 3. Chronic ischemic cerebral disease in the right greater than left anterior MCA territories and left PCA territory. Electronically Signed   By: Genevie Ann M.D.   On: 11/10/2015 12:56   US Paracentesis  Result Date: 11/09/2015 INDICATION: Patient with history of PE, remote melanoma, liver/lung lesions, cecal mass, ascites. Request made for diagnostic and therapeutic paracentesis. EXAM: ULTRASOUND GUIDED DIAGNOSTIC AND THERAPEUTIC PARACENTESIS  MEDICATIONS: None. COMPLICATIONS: None immediate. PROCEDURE: Informed written consent was obtained from the patient after a discussion of the risks, benefits and alternatives to treatment. A timeout was performed prior to the initiation of the procedure. Initial ultrasound scanning demonstrates a moderate-to-large amount of ascites within the right lower abdominal quadrant. The right lower abdomen was prepped and draped in the usual sterile fashion. 1% lidocaine was used for local anesthesia. Following this, a Yueh catheter was introduced. An ultrasound image was saved for documentation purposes. The paracentesis was performed. The catheter was removed and a dressing was applied. The patient tolerated the procedure well without immediate post procedural complication. FINDINGS: A total of approximately 4.8 liters of yellow fluid was removed. Samples were sent to the laboratory as requested by the clinical team. IMPRESSION: Successful ultrasound-guided diagnostic and therapeutic paracentesis yielding 4.8 liters of peritoneal fluid. Read by: Rowe Robert, PA-C Electronically Signed   By: Jerilynn Mages.  Shick M.D.   On: 11/09/2015 12:30    Assessment: 82 y.o. male with recently diagnosed right colon cancer, presented with progressive dyspnea, fatigue, and abdominal bloating. CT scan showed diffuse liver, peritoneum metastasis, ascites and a large right lower lobe lung mass.  1. Metastatic cancer, likely from colon 2. Iron deficient anemia 3. Ascites, likely malignant 4. Deconditioning 5. Hypertension 6. Diabetes  Recommendations: -He has metastatic cancer, probably from colon, although lung cancer or melanoma is also a possibility. -Her paracentesis cytology was nondiagnostic -I recommend peritoneal soft tissue mass biopsy by interventional radiology.  I recommend switch to Lovenox to heparin, and plan to biopsy on Monday. -I strongly recommend patient to stay in the hospital over the weekend, and get biopsy done  before discharge. Due to his submassive bilateral pulmonary embolism, his biopsy and anticoagulation will be difficult to arrange as outpatient. -He is probably not a candidate for intensive chemotherapy, however I'll evaluate to his tumor tissue to see if he is a candidate for immunotherapy. -I discussed the above with patient and his daughters. -I spoke with hospitalist Dr. Sheran Fava  I will follow up.     Truitt Merle, MD 11/10/2015  9:02 PM

## 2015-11-10 NOTE — Progress Notes (Signed)
Oncology Nurse Navigator Documentation  Oncology Nurse Navigator Flowsheets 11/09/2015  Navigator Location [facility]  Referral date to RadOnc/MedOnc -  Navigator Encounter Type Letter/Fax/Email  Telephone -  Abnormal Finding Date -  Confirmed Diagnosis Date -  Barriers/Navigation Needs Coordination of Care  Interventions Coordination of Care--faxed requisition form to Molecular genetics lab at [facility]) that has his colonoscopy path tissue and requested MSI and MMR/IHC testing per Dr. Burr Medico request (case [phone])  Coordination of Care Other--cancelled [place] visit for 10/20--admitted to hospital last night  Acuity -  Time Spent with Patient 30  After calls to [facility] pathology, then [facility] services, located the lab that has his colonoscopy tissue.

## 2015-11-10 NOTE — Evaluation (Signed)
Physical Therapy Evaluation Patient Details Name: Martin Leblanc MRN: [id number] DOB: [birth year] Today's Date: 11/10/2015   History of Present Illness  82 y.o. male admitted with dyspnea, Dx of pulmonary embolism, RLE DVT. PMH of widely metastatic cancer (colon, lung, liver, ? primary), COPD, DM, GIB.  Clinical Impression  Pt admitted with above diagnosis. Pt currently with functional limitations due to the deficits listed below (see PT Problem List). Min assist for bed mobility and transfers, pt ambulated 200' with RW. SaO2 86% on RA walking, 95% on 3L O2 walking, HR 114.  Pt will benefit from skilled PT to increase their independence and safety with mobility to allow discharge to the venue listed below.       Follow Up Recommendations Home health PT;Supervision for mobility/OOB    Equipment Recommendations  Rolling walker with 5" wheels;3in1 (PT)    Recommendations for Other Services       Precautions / Restrictions Precautions Precautions: Fall Precaution Comments: monitor O2 Restrictions Weight Bearing Restrictions: No      Mobility  Bed Mobility Overal bed mobility: Needs Assistance Bed Mobility: Supine to Sit     Supine to sit: Min assist     General bed mobility comments: min A to raise trunk  Transfers Overall transfer level: Needs assistance Equipment used: Rolling walker (2 wheeled) Transfers: Sit to/from Stand Sit to Stand: Min assist         General transfer comment: min A to rise, VCs for hand placement  Ambulation/Gait Ambulation/Gait assistance: Supervision Ambulation Distance (Feet): 200 Feet Assistive device: Rolling walker (2 wheeled) Gait Pattern/deviations: WFL(Within Functional Limits)   Gait velocity interpretation: at or above normal speed for age/gender General Gait Details: SaO2 dropped to 86% after walking 100' on RA, 95% on 3L O2 [place] with walking, HR 114, no LOB, good pursed lip breathing technique  Stairs             Wheelchair Mobility    Modified Rankin (Stroke Patients Only)       Balance Overall balance assessment: Modified Independent                                           Pertinent Vitals/Pain Pain Assessment: No/denies pain    Home Living Family/patient expects to be discharged to:: Private residence Living Arrangements: Alone Available Help at Discharge: Family;Friend(s);Available PRN/intermittently Type of Home: House Home Access: Stairs to enter Entrance Stairs-Rails: Right Entrance Stairs-Number of Steps: 6 Home Layout: Two level;Able to live on main level with bedroom/bathroom Home Equipment: None      Prior Function Level of Independence: Independent         Comments: drives     Hand Dominance        Extremity/Trunk Assessment   Upper Extremity Assessment: Overall WFL for tasks assessed           Lower Extremity Assessment: Overall WFL for tasks assessed      Cervical / Trunk Assessment: Normal  Communication   Communication: No difficulties  Cognition Arousal/Alertness: Awake/alert Behavior During Therapy: WFL for tasks assessed/performed Overall Cognitive Status: Within Functional Limits for tasks assessed                      General Comments      Exercises     Assessment/Plan    PT Assessment Patient needs continued PT services  PT  Problem List Cardiopulmonary status limiting activity;Decreased mobility;Decreased activity tolerance;Decreased knowledge of use of DME          PT Treatment Interventions Gait training;Functional mobility training;Therapeutic activities;Therapeutic exercise;Patient/family education;DME instruction    PT Goals (Current goals can be found in the Care Plan section)  Acute Rehab PT Goals Patient Stated Goal: to go home PT Goal Formulation: With patient/family Time For Goal Achievement: 11/24/15 Potential to Achieve Goals: Fair    Frequency Min 3X/week   Barriers to  discharge Decreased caregiver support -pt doesn't have 24* assistance available pt's daughter (who was present during eval) is concerned that he won't be able to manage well at home alone, other daughter (who was not present) strongly feels pt should DC to SNF or ALF, however pt not agreeable to any plan but to DC home    Co-evaluation               End of Session Equipment Utilized During Treatment: Gait belt;Oxygen Activity Tolerance: Patient tolerated treatment well Patient left: in chair;with call bell/phone within reach;with family/visitor present;with nursing/sitter in room Nurse Communication: Mobility status         Time: [id number] PT Time Calculation (min) (ACUTE ONLY): 29 min   Charges:   PT Evaluation $PT Eval Low Complexity: 1 Procedure PT Treatments $Gait Training: 8-22 mins   PT G Codes:        Philomena Doheny 11/10/2015, 2:52 PM [phone]

## 2015-11-10 NOTE — Telephone Encounter (Signed)
I called pt's daughter Ilona Sorrel and left a detailed message on her cell [phone], regarding his scan findings, cytology results, and presumed diagnosis. I encouraged her to call me back if she has further questions.  Truitt Merle  11/10/2015 8:15 AM

## 2015-11-11 ENCOUNTER — Encounter (HOSPITAL_COMMUNITY): Payer: Self-pay | Admitting: General Surgery

## 2015-11-11 ENCOUNTER — Other Ambulatory Visit: Payer: Self-pay | Admitting: Internal Medicine

## 2015-11-11 LAB — CBC
HEMATOCRIT: 28.8 % — AB (ref 39.0–52.0)
HEMOGLOBIN: 9 g/dL — AB (ref 13.0–17.0)
MCH: 23 pg — ABNORMAL LOW (ref 26.0–34.0)
MCHC: 31.3 g/dL (ref 30.0–36.0)
MCV: 73.5 fL — AB (ref 78.0–100.0)
Platelets: 369 10*3/uL (ref 150–400)
RBC: 3.92 MIL/uL — ABNORMAL LOW (ref 4.22–5.81)
RDW: 15.8 % — AB (ref 11.5–15.5)
WBC: 7.6 10*3/uL (ref 4.0–10.5)

## 2015-11-11 LAB — BASIC METABOLIC PANEL
ANION GAP: 7 (ref 5–15)
BUN: 10 mg/dL (ref 6–20)
CHLORIDE: 105 mmol/L (ref 101–111)
CO2: 24 mmol/L (ref 22–32)
Calcium: 8.4 mg/dL — ABNORMAL LOW (ref 8.9–10.3)
Creatinine, Ser: 0.78 mg/dL (ref 0.61–1.24)
GFR calc Af Amer: >60 mL/min (ref >60)
GLUCOSE: 117 mg/dL — AB (ref 65–99)
POTASSIUM: 3.9 mmol/L (ref 3.5–5.1)
Sodium: 136 mmol/L (ref 135–145)

## 2015-11-11 LAB — GLUCOSE, CAPILLARY
GLUCOSE-CAPILLARY: 225 mg/dL — AB (ref 65–99)
Glucose-Capillary: 135 mg/dL — ABNORMAL HIGH (ref 65–99)
Glucose-Capillary: 173 mg/dL — ABNORMAL HIGH (ref 65–99)
Glucose-Capillary: 157 mg/dL — ABNORMAL HIGH (ref 65–99)

## 2015-11-11 MED ORDER — SENNA 8.6 MG PO TABS: 2.0000 | ORAL_TABLET | Freq: Every day | ORAL | Status: DC

## 2015-11-11 MED ORDER — BISACODYL 10 MG RE SUPP
20.0000 mg | Freq: Once | RECTAL | Status: AC
  Administered 2015-11-11: 20 mg via RECTAL
  Filled 2015-11-11: qty 2

## 2015-11-11 MED ORDER — HEPARIN (PORCINE) IN NACL 100-0.45 UNIT/ML-% IJ SOLN
1800.0000 [IU]/h | INTRAMUSCULAR | Status: AC
  Administered 2015-11-12 – 2015-11-14 (×4): 1800 [IU]/h via INTRAVENOUS
  Filled 2015-11-11 (×4): qty 250

## 2015-11-11 MED ORDER — POLYETHYLENE GLYCOL 3350 17 G PO PACK
17.0000 g | PACK | Freq: Two times a day (BID) | ORAL | Status: DC
  Administered 2015-11-11 – 2015-11-15 (×8): 17 g via ORAL
  Filled 2015-11-11 (×11): qty 1

## 2015-11-11 MED ORDER — SENNA 8.6 MG PO TABS
2.0000 | ORAL_TABLET | Freq: Every day | ORAL | Status: DC
  Administered 2015-11-11 – 2015-11-15 (×5): 17.2 mg via ORAL
  Filled 2015-11-11 (×6): qty 2

## 2015-11-11 NOTE — Consult Note (Signed)
Chief Complaint: peritoneal nodule  Referring Physician:Dr. Truitt Merle  Supervising Physician: Daryll Brod  Patient Status: [facility] - In-pt  HPI: Martin Leblanc is an 82 y.o. male who has been admitted and found to have metastatic adenocarcinoma likely of a colon primary.  He had a paracentesis which the cytology did not reveal the information they needed in order to confirm this.  We have been asked to see him for a biopsy of a peritoneal nodule to confirm this diagnosis and to rule a possible other primary such as the lung or melanoma.  Past Medical History:  Past Medical History:  Diagnosis Date  . Diabetes mellitus without complication (HCC)    Insulin use  . Edema extremities    right greater than left feet.  Marland Kitchen GERD (gastroesophageal reflux disease)   . Hypertension   . Malignant melanoma ([place])    s/p treatment 25 yrs ago-left eye  . Shortness of breath dyspnea    recent had hospital visit 7'17 [facility]- slightly improved.  . Sight impaired    left eye- hx melanoma/radiation    Past Surgical History:  Past Surgical History:  Procedure Laterality Date  . CYSTOSCOPY W/ RETROGRADES Left 09/18/2015   Procedure: CYSTOSCOPY WITH RETROGRADE PYELOGRAM;  Surgeon: Raynelle Bring, MD;  Location: [facility]S;  Service: Urology;  Laterality: Left;  . CYSTOSCOPY WITH BIOPSY N/A 09/18/2015   Procedure: CYSTOSCOPY WITH BLADDER BIOPSY;  Surgeon: Raynelle Bring, MD;  Location: [facility];  Service: Urology;  Laterality: N/A;  . EYE SURGERY Left    laser eye surgery, after radiation for melanoma tx  . INGUINAL HERNIA REPAIR     early 2000s  . LUNG SURGERY      Family History:  Family History  Problem Relation Age of Onset  . Diabetes Maternal Grandmother   . Emphysema Father     Social History:  reports that he quit smoking about 42 years ago. His smoking use included Cigarettes. He has a 10.00 pack-year smoking history. He has never used smokeless tobacco. He reports that he does not drink alcohol  or use drugs.  Allergies: No Known Allergies  Medications: Medications reviewed in epic  Please HPI for pertinent positives, otherwise complete 10 system ROS negative.  Mallampati Score: MD Evaluation Airway: WNL Heart: WNL Abdomen: WNL Chest/ Lungs: WNL ASA  Classification: 3 Mallampati/Airway Score: Two  Physical Exam: BP 112/72 (BP Location: Left Arm)   Pulse 90   Temp 98.7 F (37.1 C) (Oral)   Resp (!) 24   Ht 5' 11"  (1.803 m)   Wt 222 lb 10.6 oz (101 kg)   SpO2 100%   BMI 31.06 kg/m  Body mass index is 31.06 kg/m. General: pleasant, WD, WN, elderly white male who is laying in bed in NAD HEENT: head is normocephalic, atraumatic.  Sclera are noninjected.  Ears and nose without any masses or lesions.  Mouth is pink and moist Heart: regular, rate, and rhythm.  Normal s1,s2. No obvious murmurs, gallops, or rubs noted.  Palpable radial and pedal pulses bilaterally Lungs: CTAB, no wheezes, rhonchi, or rales noted.  Respiratory effort nonlabored Abd: soft, NT, ND, +BS, no masses, hernias, or organomegaly Psych: A&Ox3 with an appropriate affect.   Labs: Results for orders placed or performed during the hospital encounter of 11/08/15 (from the past 48 hour(s))  Glucose, capillary     Status: Abnormal   Collection Time: 11/09/15  3:40 PM  Result Value Ref Range   Glucose-Capillary 164 (H) 65 -  99 mg/dL  Glucose, capillary     Status: Abnormal   Collection Time: 11/09/15  9:54 PM  Result Value Ref Range   Glucose-Capillary 144 (H) 65 - 99 mg/dL   Comment 1 Notify RN    Comment 2 Document in Chart   Basic metabolic panel     Status: Abnormal   Collection Time: 11/10/15  3:19 AM  Result Value Ref Range   Sodium 137 135 - 145 mmol/L   Potassium 3.8 3.5 - 5.1 mmol/L   Chloride 107 101 - 111 mmol/L   CO2 24 22 - 32 mmol/L   Glucose, Bld 135 (H) 65 - 99 mg/dL   BUN 10 6 - 20 mg/dL   Creatinine, Ser 0.85 0.61 - 1.24 mg/dL   Calcium 8.5 (L) 8.9 - 10.3 mg/dL   GFR calc  non Af Amer >60 >60 mL/min   GFR calc Af Amer >60 >60 mL/min    Comment: (NOTE) The eGFR has been calculated using the CKD EPI equation. This calculation has not been validated in all clinical situations. eGFR's persistently <60 mL/min signify possible Chronic Kidney Disease.    Anion gap 6 5 - 15  Vitamin B12     Status: Abnormal   Collection Time: 11/10/15  3:19 AM  Result Value Ref Range   Vitamin B-12 1,144 (H) 180 - 914 pg/mL    Comment: (NOTE) This assay is not validated for testing neonatal or myeloproliferative syndrome specimens for Vitamin B12 levels. Performed at [facility]   Folate     Status: None   Collection Time: 11/10/15  3:19 AM  Result Value Ref Range   Folate 13.3 >5.9 ng/mL    Comment: Performed at [facility]  CBC     Status: Abnormal   Collection Time: 11/10/15  3:19 AM  Result Value Ref Range   WBC 7.7 4.0 - 10.5 K/uL   RBC 3.84 (L) 4.22 - 5.81 MIL/uL   Hemoglobin 9.1 (L) 13.0 - 17.0 g/dL   HCT 28.8 (L) 39.0 - 52.0 %   MCV 75.0 (L) 78.0 - 100.0 fL   MCH 23.7 (L) 26.0 - 34.0 pg   MCHC 31.6 30.0 - 36.0 g/dL   RDW 15.9 (H) 11.5 - 15.5 %   Platelets 395 150 - 400 K/uL  Glucose, capillary     Status: Abnormal   Collection Time: 11/10/15  7:58 AM  Result Value Ref Range   Glucose-Capillary 157 (H) 65 - 99 mg/dL  Glucose, capillary     Status: Abnormal   Collection Time: 11/10/15 12:48 PM  Result Value Ref Range   Glucose-Capillary 182 (H) 65 - 99 mg/dL  Glucose, capillary     Status: Abnormal   Collection Time: 11/10/15  4:30 PM  Result Value Ref Range   Glucose-Capillary 226 (H) 65 - 99 mg/dL  Glucose, capillary     Status: Abnormal   Collection Time: 11/10/15 11:46 PM  Result Value Ref Range   Glucose-Capillary 118 (H) 65 - 99 mg/dL  Basic metabolic panel     Status: Abnormal   Collection Time: 11/11/15  5:59 AM  Result Value Ref Range   Sodium 136 135 - 145 mmol/L   Potassium 3.9 3.5 - 5.1 mmol/L   Chloride 105 101 - 111  mmol/L   CO2 24 22 - 32 mmol/L   Glucose, Bld 117 (H) 65 - 99 mg/dL   BUN 10 6 - 20 mg/dL   Creatinine, Ser 0.78 0.61 - 1.24  mg/dL   Calcium 8.4 (L) 8.9 - 10.3 mg/dL   GFR calc non Af Amer >60 >60 mL/min   GFR calc Af Amer >60 >60 mL/min    Comment: (NOTE) The eGFR has been calculated using the CKD EPI equation. This calculation has not been validated in all clinical situations. eGFR's persistently <60 mL/min signify possible Chronic Kidney Disease.    Anion gap 7 5 - 15  CBC     Status: Abnormal   Collection Time: 11/11/15  5:59 AM  Result Value Ref Range   WBC 7.6 4.0 - 10.5 K/uL   RBC 3.92 (L) 4.22 - 5.81 MIL/uL   Hemoglobin 9.0 (L) 13.0 - 17.0 g/dL   HCT 28.8 (L) 39.0 - 52.0 %   MCV 73.5 (L) 78.0 - 100.0 fL   MCH 23.0 (L) 26.0 - 34.0 pg   MCHC 31.3 30.0 - 36.0 g/dL   RDW 15.8 (H) 11.5 - 15.5 %   Platelets 369 150 - 400 K/uL  Glucose, capillary     Status: Abnormal   Collection Time: 11/11/15  7:29 AM  Result Value Ref Range   Glucose-Capillary 135 (H) 65 - 99 mg/dL  Glucose, capillary     Status: Abnormal   Collection Time: 11/11/15 11:26 AM  Result Value Ref Range   Glucose-Capillary 173 (H) 65 - 99 mg/dL    Imaging: Mr Jeri Cos Wo Contrast  Result Date: 11/10/2015 CLINICAL DATA:  82 year old male with metastatic melanoma. Acute pulmonary emboli. Left ocular melanoma status post brachytherapy. Staging. Subsequent encounter. EXAM: MRI HEAD WITHOUT AND WITH CONTRAST TECHNIQUE: Multiplanar, multiecho pulse sequences of the brain and surrounding structures were obtained without and with intravenous contrast. CONTRAST:  11m MULTIHANCE GADOBENATE DIMEGLUMINE 529 MG/ML IV SOLN COMPARISON:  Chest CTA 11/08/2015. FINDINGS: Brain: Evidence of intravenous iron or other diamagnetic intravascular substance simulating the appearance of contrast even on the noncontrast portion of the exam, and degrading T2* imaging. Still, there is no evidence of intracranial mass or abnormal  enhancement. No intracranial mass effect. No evidence of dural thickening. Right anterior superior frontal gyrus cortical encephalomalacia with mild white matter gliosis. Small areas of cortical encephalomalacia also in the anterior left superior frontal gyrus (series 7, image 20) and in the left posterior occipital lobe (series 6, image 9). Elsewhere minimal to mild for age nonspecific cerebral white matter T2 and FLAIR hyperintensity. Deep gray matter nuclei and brainstem are within normal limits. There are probably several tiny chronic lacunar infarcts in both cerebellar hemispheres. No restricted diffusion to suggest acute infarction. No midline shift, mass effect, ventriculomegaly, extra-axial collection or acute intracranial hemorrhage. Cervicomedullary junction and pituitary are within normal limits. Vascular: Major intracranial vascular flow voids are preserved, dominant distal left vertebral artery. Skull and upper cervical spine: Negative visualized cervical spinal cord. There is heterogeneous bone marrow signal throughout the skull and upper cervical spine, but no destructive osseous lesion identified. Sinuses/Orbits: Abnormal left globe with 10 mm decreased T2 signal and increased T1 or enhancing signal (series 6, image 8). The other left intraorbital soft tissues remain normal. The right orbit appears normal. Mild paranasal sinus mucosal thickening. Other: Visible internal auditory structures appear normal. Mastoids are clear. Negative scalp soft tissues. IMPRESSION: 1. Mild artifact related to intravenous iron or other diet magnetic intravascular substance. However, the study is diagnostic with No metastatic disease to the brain or acute intracranial abnormality. 2. A 10 mm left globe mass is compatible with the stated history of left orbital melanoma involvement. 3.  Chronic ischemic cerebral disease in the right greater than left anterior MCA territories and left PCA territory. Electronically Signed    By: Genevie Ann M.D.   On: 11/10/2015 12:56    Assessment/Plan 1. Extensive metastatic disease of unknown primary, likely colon with peritoneal nodule -we will plan for a biopsy of one of the peritoneal nodules on Monday. -He is currently receiving Lovenox but this will be switched to a heparin drip tomorrow. Once family time on Monday morning we will need to hold his heparin drip for 3 hours prior to his procedure. -Nothing by mouth after midnight on Sunday for a procedure on Monday. -Labs have been reviewed as well as his vital signs. -Risks and Benefits discussed with the patient including, but not limited to bleeding, infection, damage to adjacent structures or low yield requiring additional tests. All of the patient's questions were answered, patient is agreeable to proceed. Consent signed and in chart.  Thank you for this interesting consult.  I greatly enjoyed meeting Martin Leblanc and look forward to participating in their care.  A copy of this report was sent to the requesting provider on this date.  Electronically Signed: Henreitta Cea 11/11/2015, 1:46 PM   I spent a total of 40 Minutes    in face to face in clinical consultation, greater than 50% of which was counseling/coordinating care for peritoneal nodule

## 2015-11-11 NOTE — Progress Notes (Addendum)
PROGRESS NOTE  Martin Leblanc  [id number] DOB: 22-Mar-1929 DOA: 11/08/2015 PCP: Sheela Stack, MD  Brief Narrative:   Martin Leblanc is a 82 y.o. gentleman with a history melanoma affecting his left eye, newly diagnosed metastatic adenocarcinoma likely colon primary, HTN, DM, and GERD who presented to the ED with progressive SOB.  He was found to have submassive PE, probable malignant ascites and lung mets.  He has an acute RLE DVT.  He was also in a-fib with RVR.  He was started on lovenox.  He had a paracentesis with removal of 4.8L of cloudy fluid on 10/19, however, cytology was nondiagnostic.  Awaiting a CT-guided omental biopsy by radiology on Monday.  Planning to bridge to heparin on Sunday in preparation for procedure on Monday.  Dr. Burr Medico from oncology has also requested additional tests be performed on the pathology specimen obtained by [facility] from his colonoscopy.  Will need oxygen at time of discharge.    Assessment & Plan:   Principal Problem:   Pulmonary embolus (HCC) Active Problems:   Dyspnea   Ascites   Atrial fibrillation with RVR (HCC)   Lung mass   Mass of cecum   Liver metastases (HCC)   Acute respiratory failure with hypoxia (HCC)   Aortic stenosis   PE (pulmonary thromboembolism) (HCC)   Metastatic cancer (HCC)  Acute respiratory failure secondary to submassive PE with acute hypoxia and hemodynamic instability, acute RIGHT lower extremity DVT. -- Continue Lovenox today -- Transition to heparin Sunday --Echo:  Moderate LVH, preserved EF, no evidence of right heart strain.    Paroxysmal a fib, CHADS-Vasc 4 --Likely provoked by hypoxia and acute PE --Anticoagulated  --TSH within normal limits --Echo without regional wall motion abnl  Moderate aortic valve stenosis -  Follow up with cardiology   Metastatic adenocarcinoma, likely colon primary but large lung mass concerning for possible lung primary.  Pulmonology feels that appearance is not typical for lung  primary, however, would like pathology to confirm.   -- Appreciate Dr. Ernestina Penna assistance -  Cytology from paracentesis was nondiagnostic -  Plan for omental biopsy by IR on Monday -  Bridging to heparin gtt on Sunday in preparation for procedure on Monday.  Plan to resume heparin gtt after procedure (instead of lovenox) for at least 24 hours to ensure no bleeding complications  DM type 2, A1c 8.3, CBG well controlled --Low carb diet, SSI  Iron deficiency anemia due to metastatic cancer.  B12, folate wnl -  feraheme x 1 on 10/20 -  Continue iron twice daily  Ascites, likely malignant based on CT scan, but cytology nondiagnostic -  Paracentesis performed with removal of 4.8L of yellow fluid on 10/19  Constipation, start miralax, senna, and bisacodyl -  At risk for colonic obstruction due to his malignancy.  Important to keep stools soft.  HTN, blood pressure low normal.  D/c ARB GERD, stable, continue PPI BPH, stable, continue cialis Hypokalemia, resolved with oral potassium supplementation  DVT prophylaxis:  Lovenox  Code Status:  Full code Family Communication:  Patient alone Disposition Plan:  Omental biopsy on Monday.      Consultants:   [place]  [facility], Dr. Burr Medico  IR  Procedures:  Lower extremity duplex on 10/19:  Acute right lower extremity DVT Paracentesis on 10/19:  Removal of 4.8 L of yellow fluid Echocardiogram:  Preserved EF, moderate AVS  Antimicrobials:   None    Subjective: Patient states he is breathing easier.  Abdomen feels better  after paracentesis.  Cough improving.   Objective: Vitals:   11/10/15 1445 11/10/15 1551 11/10/15 2206 11/11/15 0458  BP:  110/60 116/61 112/72  Pulse: (!) 114 (!) 101 93 90  Resp:  18 (!) 22 (!) 24  Temp:  98.2 F (36.8 C) 98.6 F (37 C) 98.7 F (37.1 C)  TempSrc:  Oral Oral Oral  SpO2: (!) 86% 96% 93% 100%  Weight:      Height:        Intake/Output Summary (Last 24 hours) at 11/11/15 1240 Last data filed  at 11/11/15 0459  Gross per 24 hour  Intake                0 ml  Output              675 ml  Net             -675 ml   Filed Weights   11/08/15 1858 11/09/15 0415  Weight: 99.8 kg (220 lb) 101 kg (222 lb 10.6 oz)    Examination:  General exam:  Adult Male. NAD HEENT:  NCAT, MMM Respiratory system: Diminished at the bilateral bases, otherwise clear to auscultation bilaterally Cardiovascular system: Regular rate and rhythm, normal S1/S2. No murmurs, rubs, gallops or clicks.  Warm extremities Gastrointestinal system: Normal active bowel sounds, palpable mass along bowels to the right of the umbilicus, 20 cm in length, uncomfortable but not painful with palpation MSK:  Normal tone and bulk, no lower extremity edema Neuro:  Grossly moves all extremities    Data Reviewed: I have personally reviewed following labs and imaging studies  CBC:  Recent Labs Lab 11/08/15 2000 11/09/15 0338 11/10/15 0319 11/11/15 0559  WBC 10.3 8.6 7.7 7.6  NEUTROABS 8.3*  --   --   --   HGB 9.6* 8.8* 9.1* 9.0*  HCT 30.5* 27.7* 28.8* 28.8*  MCV 75.3* 74.7* 75.0* 73.5*  PLT 402* 382 395 0000000   Basic Metabolic Panel:  Recent Labs Lab 11/08/15 2000 11/09/15 0315 11/09/15 0338 11/10/15 0319 11/11/15 0559  NA 136  --  135 137 136  K 4.1  --  3.4* 3.8 3.9  CL 105  --  105 107 105  CO2 22  --  23 24 24   GLUCOSE 86  --  118* 135* 117*  BUN 12  --  11 10 10   CREATININE 0.90  --  0.90 0.85 0.78  CALCIUM 8.9  --  8.9 8.5* 8.4*  MG  --  1.7  --   --   --    GFR: Estimated Creatinine Clearance: 83.2 mL/min (by C-G formula based on SCr of 0.78 mg/dL). Liver Function Tests:  Recent Labs Lab 11/08/15 2000  AST 36  ALT 24  ALKPHOS 104  BILITOT 0.8  PROT 6.9  ALBUMIN 2.9*    Recent Labs Lab 11/08/15 2000  LIPASE 13   No results for input(s): AMMONIA in the last 168 hours. Coagulation Profile:  Recent Labs Lab 11/08/15 1953  INR 1.18   Cardiac Enzymes:  Recent Labs Lab  11/09/15 0315 11/09/15 0642 11/09/15 1315  TROPONINI 0.03* 0.03* 0.03*   BNP (last 3 results) No results for input(s): PROBNP in the last 8760 hours. HbA1C:  Recent Labs  11/08/15 2000  HGBA1C 8.3*   CBG:  Recent Labs Lab 11/10/15 1248 11/10/15 1630 11/10/15 2346 11/11/15 0729 11/11/15 1126  GLUCAP 182* 226* 118* 135* 173*   Lipid Profile: No results for input(s): CHOL, HDL, LDLCALC, TRIG,  CHOLHDL, LDLDIRECT in the last 72 hours. Thyroid Function Tests:  Recent Labs  11/09/15 0315  TSH 2.679  FREET4 1.47*   Anemia Panel:  Recent Labs  11/10/15 0319  VITAMINB12 1,144*  FOLATE 13.3   Urine analysis:    Component Value Date/Time   COLORURINE YELLOW 07/25/2015 1257   APPEARANCEUR TURBID (A) 07/25/2015 1257   LABSPEC 1.019 07/25/2015 1257   PHURINE 6.5 07/25/2015 1257   GLUCOSEU 500 (A) 07/25/2015 1257   HGBUR NEGATIVE 07/25/2015 1257   BILIRUBINUR NEGATIVE 07/25/2015 1257   KETONESUR NEGATIVE 07/25/2015 1257   PROTEINUR NEGATIVE 07/25/2015 1257   NITRITE NEGATIVE 07/25/2015 1257   LEUKOCYTESUR MODERATE (A) 07/25/2015 1257   Sepsis Labs: @LABRCNTIP (procalcitonin:4,lacticidven:4)  ) Recent Results (from the past 240 hour(s))  MRSA PCR Screening     Status: None   Collection Time: 11/09/15  4:20 AM  Result Value Ref Range Status   MRSA by PCR NEGATIVE NEGATIVE Final    Comment:        The GeneXpert MRSA Assay (FDA approved for NASAL specimens only), is one component of a comprehensive MRSA colonization surveillance program. It is not intended to diagnose MRSA infection nor to guide or monitor treatment for MRSA infections.   Body fluid culture     Status: None (Preliminary result)   Collection Time: 11/09/15 10:45 AM  Result Value Ref Range Status   Specimen Description FLUID ASCITES  Final   Special Requests NONE  Final   Gram Stain   Final    FEW WBC PRESENT, PREDOMINANTLY MONONUCLEAR NO ORGANISMS SEEN    Culture   Final    NO GROWTH  2 DAYS Performed at [facility]    Report Status PENDING  Incomplete      Radiology Studies: Mr Jeri Cos Wo Contrast  Result Date: 11/10/2015 CLINICAL DATA:  82 year old male with metastatic melanoma. Acute pulmonary emboli. Left ocular melanoma status post brachytherapy. Staging. Subsequent encounter. EXAM: MRI HEAD WITHOUT AND WITH CONTRAST TECHNIQUE: Multiplanar, multiecho pulse sequences of the brain and surrounding structures were obtained without and with intravenous contrast. CONTRAST:  69mL MULTIHANCE GADOBENATE DIMEGLUMINE 529 MG/ML IV SOLN COMPARISON:  Chest CTA 11/08/2015. FINDINGS: Brain: Evidence of intravenous iron or other diamagnetic intravascular substance simulating the appearance of contrast even on the noncontrast portion of the exam, and degrading T2* imaging. Still, there is no evidence of intracranial mass or abnormal enhancement. No intracranial mass effect. No evidence of dural thickening. Right anterior superior frontal gyrus cortical encephalomalacia with mild white matter gliosis. Small areas of cortical encephalomalacia also in the anterior left superior frontal gyrus (series 7, image 20) and in the left posterior occipital lobe (series 6, image 9). Elsewhere minimal to mild for age nonspecific cerebral white matter T2 and FLAIR hyperintensity. Deep gray matter nuclei and brainstem are within normal limits. There are probably several tiny chronic lacunar infarcts in both cerebellar hemispheres. No restricted diffusion to suggest acute infarction. No midline shift, mass effect, ventriculomegaly, extra-axial collection or acute intracranial hemorrhage. Cervicomedullary junction and pituitary are within normal limits. Vascular: Major intracranial vascular flow voids are preserved, dominant distal left vertebral artery. Skull and upper cervical spine: Negative visualized cervical spinal cord. There is heterogeneous bone marrow signal throughout the skull and upper cervical  spine, but no destructive osseous lesion identified. Sinuses/Orbits: Abnormal left globe with 10 mm decreased T2 signal and increased T1 or enhancing signal (series 6, image 8). The other left intraorbital soft tissues remain normal. The right orbit appears  normal. Mild paranasal sinus mucosal thickening. Other: Visible internal auditory structures appear normal. Mastoids are clear. Negative scalp soft tissues. IMPRESSION: 1. Mild artifact related to intravenous iron or other diet magnetic intravascular substance. However, the study is diagnostic with No metastatic disease to the brain or acute intracranial abnormality. 2. A 10 mm left globe mass is compatible with the stated history of left orbital melanoma involvement. 3. Chronic ischemic cerebral disease in the right greater than left anterior MCA territories and left PCA territory. Electronically Signed   By: Genevie Ann M.D.   On: 11/10/2015 12:56     Scheduled Meds: . atorvastatin  20 mg Oral Daily  . clotrimazole   Topical BID  . darifenacin  15 mg Oral Daily  . doxazosin  4 mg Oral Daily  . enoxaparin (LOVENOX) injection  100 mg Subcutaneous BID  . feeding supplement (ENSURE ENLIVE)  237 mL Oral BID BM  . ferrous sulfate  325 mg Oral BID WC  . insulin aspart  0-15 Units Subcutaneous TID WC  . pantoprazole  40 mg Oral Daily  . polyethylene glycol  17 g Oral BID  . senna  2 tablet Oral QHS  . sodium chloride flush  3 mL Intravenous Q12H   Continuous Infusions:    LOS: 3 days    Time spent: 30 min    Janece Canterbury, MD [facility] Pager [phone]  If 7PM-7AM, please contact night-coverage [email or web address] Password [email or web address] 11/11/2015, 12:40 PM

## 2015-11-11 NOTE — Progress Notes (Signed)
ANTICOAGULATION CONSULT NOTE - Follow-up Consult  Pharmacy Consult for heparin IV to enoxaparin back to heparin 10/22 Indication: pulmonary embolus  No Known Allergies  Patient Measurements: Height: 5\' 11"  (180.3 cm) Weight: 222 lb 10.6 oz (101 kg) IBW/kg (Calculated) : 75.3 Heparin Dosing Weight: 96 kg  Vital Signs: Temp: 98.7 F (37.1 C) (10/21 0458) Temp Source: Oral (10/21 0458) BP: 112/72 (10/21 0458) Pulse Rate: 90 (10/21 0458)  Labs:  Recent Labs  11/08/15 1953  11/09/15 0315 11/09/15 0338 11/09/15 0642 11/09/15 1315 11/10/15 0319 11/11/15 0559  HGB  --   < >  --  8.8*  --   --  9.1* 9.0*  HCT  --   < >  --  27.7*  --   --  28.8* 28.8*  PLT  --   < >  --  382  --   --  395 369  APTT 31  --   --   --   --   --   --   --   LABPROT 15.1  --   --   --   --   --   --   --   INR 1.18  --   --   --   --   --   --   --   HEPARINUNFRC  --   --   --   --  0.12*  --   --   --   CREATININE  --   < >  --  0.90  --   --  0.85 0.78  TROPONINI  --   --  0.03*  --  0.03* 0.03*  --   --   < > = values in this interval not displayed.  Estimated Creatinine Clearance: 83.2 mL/min (by C-G formula based on SCr of 0.78 mg/dL).  Assessment: 39 yoM with PMH DM, aortic stenosis, ocular melanoma and metastatic cancer of unknown primary with malignant ascites presents from home with shortness of breath.  CTa reveals submassive PE. Pharmacy to dose heparin IV for pulmonary embolism. Per MD, patient will likely need thoracentesis tomorrow AM.   Baseline INR = 1.18, aPTT = 31sec  Prior anticoagulation: none  Significant events: 10/19: heparin to LMWH for more predictable response 10/22: change from LMWH back to heparin gtt for biopsy 10/23 of peritoneal soft tissue mass by IR  Today, 11/11/2015:  Initial heparin level on 10/19 was subtherapeutic on heparin 1500 units/hr  CBC: Hgb low but remains stable; Plt wnl  Goal of Therapy: Heparin level 0.3-0.7 LMWH anti-Xa level 0.6-1.0  units/mL Monitor platelets by anticoagulation protocol: Yes  Plan:  Last dose of enoxaparin scheduled for 22:00 tonight (10/21)  At 9am on 10/22 (1h prior to when next dose of enoxaparin would have been due), start heparin gtt (No bolus) at 1800 units/hr based on previous heparin rate and corresponding level  Check 8hr heparin level then daily  Daily CBC  Per IR notes, wants heparin off x 3 hrs prior to procedure  Doreene Eland, PharmD, BCPS.   Pager: [phone] 11/11/2015 1:26 PM

## 2015-11-12 LAB — CBC
HEMATOCRIT: 30.1 % — AB (ref 39.0–52.0)
HEMOGLOBIN: 9.4 g/dL — AB (ref 13.0–17.0)
MCH: 23.4 pg — ABNORMAL LOW (ref 26.0–34.0)
MCHC: 31.2 g/dL (ref 30.0–36.0)
MCV: 75.1 fL — AB (ref 78.0–100.0)
Platelets: 395 10*3/uL (ref 150–400)
RBC: 4.01 MIL/uL — ABNORMAL LOW (ref 4.22–5.81)
RDW: 15.9 % — ABNORMAL HIGH (ref 11.5–15.5)
WBC: 7.1 10*3/uL (ref 4.0–10.5)

## 2015-11-12 LAB — BASIC METABOLIC PANEL
ANION GAP: 8 (ref 5–15)
BUN: 11 mg/dL (ref 6–20)
CHLORIDE: 102 mmol/L (ref 101–111)
CO2: 25 mmol/L (ref 22–32)
Calcium: 8.6 mg/dL — ABNORMAL LOW (ref 8.9–10.3)
Creatinine, Ser: 0.8 mg/dL (ref 0.61–1.24)
GFR calc Af Amer: >60 mL/min (ref >60)
GLUCOSE: 152 mg/dL — AB (ref 65–99)
POTASSIUM: 3.9 mmol/L (ref 3.5–5.1)
Sodium: 135 mmol/L (ref 135–145)

## 2015-11-12 LAB — BODY FLUID CULTURE: CULTURE: NO GROWTH

## 2015-11-12 LAB — GLUCOSE, CAPILLARY
GLUCOSE-CAPILLARY: 249 mg/dL — AB (ref 65–99)
GLUCOSE-CAPILLARY: 150 mg/dL — AB (ref 65–99)
Glucose-Capillary: 160 mg/dL — ABNORMAL HIGH (ref 65–99)
Glucose-Capillary: 199 mg/dL — ABNORMAL HIGH (ref 65–99)

## 2015-11-12 LAB — HEPARIN LEVEL (UNFRACTIONATED): Heparin Unfractionated: 0.32 IU/mL (ref 0.30–0.70)

## 2015-11-12 NOTE — Progress Notes (Signed)
PROGRESS NOTE  Martin Leblanc  [id number] DOB: Jul 11, 1929 DOA: 11/08/2015 PCP: Sheela Stack, MD  Brief Narrative:   Martin Leblanc is a 82 y.o. gentleman with a history melanoma affecting his left eye, newly diagnosed metastatic adenocarcinoma likely colon primary, HTN, DM, and GERD who presented to the ED with progressive SOB.  He was found to have submassive PE, probable malignant ascites and lung mets.  He has an acute RLE DVT.  He was also in a-fib with RVR.  He was started on lovenox.  He had a paracentesis with removal of 4.8L of cloudy fluid on 10/19, however, cytology was nondiagnostic.  Awaiting a CT-guided omental biopsy by radiology on Monday.  Planning to bridge to heparin on Sunday in preparation for procedure on Monday.  Dr. Burr Medico from oncology has also requested additional tests be performed on the pathology specimen obtained by [facility] from his colonoscopy.  Will need oxygen at time of discharge.    Assessment & Plan:   Principal Problem:   Pulmonary embolus (HCC) Active Problems:   Dyspnea   Ascites   Atrial fibrillation with RVR (HCC)   Lung mass   Mass of cecum   Liver metastases (HCC)   Acute respiratory failure with hypoxia (HCC)   Aortic stenosis   PE (pulmonary thromboembolism) (HCC)   Metastatic cancer (HCC)  Acute respiratory failure secondary to submassive PE with acute hypoxia and hemodynamic instability, acute RIGHT lower extremity DVT. -- Transition to heparin  --  Echo:  Moderate LVH, preserved EF, no evidence of right heart strain.    Paroxysmal a fib, CHADS-Vasc 4 --Likely provoked by hypoxia and acute PE --Anticoagulated  --TSH within normal limits --Echo without regional wall motion abnl  Moderate aortic valve stenosis -  Follow up with cardiology   Metastatic adenocarcinoma, likely colon primary but large lung mass concerning for possible lung primary.  Pulmonology feels that appearance is not typical for lung primary, however, would like  pathology to confirm.   -- Appreciate Dr. Ernestina Penna assistance -  Cytology from paracentesis was nondiagnostic -  Plan for omental biopsy by IR on Monday  DM type 2, A1c 8.3, CBG well controlled --Low carb diet, SSI  Iron deficiency anemia due to metastatic cancer.  B12, folate wnl -  feraheme x 1 on 10/20 -  Continue iron twice daily  Ascites, likely malignant based on CT scan, but cytology nondiagnostic -  Paracentesis performed with removal of 4.8L of yellow fluid on 10/19  Constipation, start miralax, senna, and bisacodyl, resolving -  At risk for colonic obstruction due to his malignancy.  Important to keep stools soft.  HTN, blood pressure low normal.  D/c ARB GERD, stable, continue PPI BPH, stable, continue cialis Hypokalemia, resolved with oral potassium supplementation  DVT prophylaxis:  Lovenox  Code Status:  Full code Family Communication:  Patient alone Disposition Plan:  Omental biopsy on Monday.      Consultants:   [facility]  Oncology, Dr. Burr Medico  IR  Procedures:  Lower extremity duplex on 10/19:  Acute right lower extremity DVT Paracentesis on 10/19:  Removal of 4.8 L of yellow fluid Echocardiogram:  Preserved EF, moderate AVS  Antimicrobials:   None    Subjective: Patient states he is breathing easier.  Abdomen feels better after paracentesis and stools.    Objective: Vitals:   11/11/15 0458 11/11/15 1433 11/11/15 2100 11/12/15 0500  BP: 112/72 116/67 (!) 113/59 112/60  Pulse: 90 94 97 88  Resp: (!) 24  20 20 20   Temp: 98.7 F (37.1 C) 98.1 F (36.7 C) 97.6 F (36.4 C) 98.1 F (36.7 C)  TempSrc: Oral Oral Oral Oral  SpO2: 100% 96% 96% 99%  Weight:      Height:        Intake/Output Summary (Last 24 hours) at 11/12/15 1407 Last data filed at 11/12/15 0501  Gross per 24 hour  Intake                0 ml  Output              300 ml  Net             -300 ml   Filed Weights   11/08/15 1858 11/09/15 0415  Weight: 99.8 kg (220 lb) 101 kg (222  lb 10.6 oz)    Examination:  General exam:  Adult Male. NAD HEENT:  NCAT, MMM Respiratory system: Diminished at the bilateral bases, otherwise clear to auscultation bilaterally Cardiovascular system: Regular rate and rhythm, normal S1/S2. No murmurs, rubs, gallops or clicks.  Warm extremities Gastrointestinal system: Normal active bowel sounds, palpable mass along bowels to the right of the umbilicus, 20 cm in length, nontender.  Belly even less full-feeling than yesterday MSK:  Normal tone and bulk, no lower extremity edema Neuro:  Grossly moves all extremities    Data Reviewed: I have personally reviewed following labs and imaging studies  CBC:  Recent Labs Lab 11/08/15 2000 11/09/15 0338 11/10/15 0319 11/11/15 0559 11/12/15 0540  WBC 10.3 8.6 7.7 7.6 7.1  NEUTROABS 8.3*  --   --   --   --   HGB 9.6* 8.8* 9.1* 9.0* 9.4*  HCT 30.5* 27.7* 28.8* 28.8* 30.1*  MCV 75.3* 74.7* 75.0* 73.5* 75.1*  PLT 402* 382 395 369 [id number]   Basic Metabolic Panel:  Recent Labs Lab 11/08/15 2000 11/09/15 0315 11/09/15 0338 11/10/15 0319 11/11/15 0559 11/12/15 0540  NA 136  --  135 137 136 135  K 4.1  --  3.4* 3.8 3.9 3.9  CL 105  --  105 107 105 102  CO2 22  --  23 24 24 25   GLUCOSE 86  --  118* 135* 117* 152*  BUN 12  --  11 10 10 11   CREATININE 0.90  --  0.90 0.85 0.78 0.80  CALCIUM 8.9  --  8.9 8.5* 8.4* 8.6*  MG  --  1.7  --   --   --   --    GFR: Estimated Creatinine Clearance: 83.2 mL/min (by C-G formula based on SCr of 0.8 mg/dL). Liver Function Tests:  Recent Labs Lab 11/08/15 2000  AST 36  ALT 24  ALKPHOS 104  BILITOT 0.8  PROT 6.9  ALBUMIN 2.9*    Recent Labs Lab 11/08/15 2000  LIPASE 13   No results for input(s): AMMONIA in the last 168 hours. Coagulation Profile:  Recent Labs Lab 11/08/15 1953  INR 1.18   Cardiac Enzymes:  Recent Labs Lab 11/09/15 0315 11/09/15 0642 11/09/15 1315  TROPONINI 0.03* 0.03* 0.03*   BNP (last 3 results) No  results for input(s): PROBNP in the last 8760 hours. HbA1C: No results for input(s): HGBA1C in the last 72 hours. CBG:  Recent Labs Lab 11/11/15 1126 11/11/15 1640 11/11/15 2126 11/12/15 0745 11/12/15 1117  GLUCAP 173* 157* 225* 160* 249*   Lipid Profile: No results for input(s): CHOL, HDL, LDLCALC, TRIG, CHOLHDL, LDLDIRECT in the last 72 hours. Thyroid Function Tests: No results  for input(s): TSH, T4TOTAL, FREET4, T3FREE, THYROIDAB in the last 72 hours. Anemia Panel:  Recent Labs  11/10/15 0319  VITAMINB12 1,144*  FOLATE 13.3   Urine analysis:    Component Value Date/Time   COLORURINE YELLOW 07/25/2015 1257   APPEARANCEUR TURBID (A) 07/25/2015 1257   LABSPEC 1.019 07/25/2015 1257   PHURINE 6.5 07/25/2015 1257   GLUCOSEU 500 (A) 07/25/2015 1257   HGBUR NEGATIVE 07/25/2015 1257   BILIRUBINUR NEGATIVE 07/25/2015 1257   KETONESUR NEGATIVE 07/25/2015 1257   PROTEINUR NEGATIVE 07/25/2015 1257   NITRITE NEGATIVE 07/25/2015 1257   LEUKOCYTESUR MODERATE (A) 07/25/2015 1257   Sepsis Labs: @LABRCNTIP (procalcitonin:4,lacticidven:4)  ) Recent Results (from the past 240 hour(s))  MRSA PCR Screening     Status: None   Collection Time: 11/09/15  4:20 AM  Result Value Ref Range Status   MRSA by PCR NEGATIVE NEGATIVE Final    Comment:        The GeneXpert MRSA Assay (FDA approved for NASAL specimens only), is one component of a comprehensive MRSA colonization surveillance program. It is not intended to diagnose MRSA infection nor to guide or monitor treatment for MRSA infections.   Body fluid culture     Status: None (Preliminary result)   Collection Time: 11/09/15 10:45 AM  Result Value Ref Range Status   Specimen Description FLUID ASCITES  Final   Special Requests NONE  Final   Gram Stain   Final    FEW WBC PRESENT, PREDOMINANTLY MONONUCLEAR NO ORGANISMS SEEN    Culture   Final    NO GROWTH 3 DAYS Performed at [facility]    Report Status PENDING   Incomplete      Radiology Studies: No results found.   Scheduled Meds: . atorvastatin  20 mg Oral Daily  . clotrimazole   Topical BID  . darifenacin  15 mg Oral Daily  . doxazosin  4 mg Oral Daily  . feeding supplement (ENSURE ENLIVE)  237 mL Oral BID BM  . ferrous sulfate  325 mg Oral BID WC  . insulin aspart  0-15 Units Subcutaneous TID WC  . pantoprazole  40 mg Oral Daily  . polyethylene glycol  17 g Oral BID  . senna  2 tablet Oral QHS  . sodium chloride flush  3 mL Intravenous Q12H   Continuous Infusions: . heparin 1,800 Units/hr (11/12/15 0919)     LOS: 4 days    Time spent: 30 min    Janece Canterbury, MD [facility] Pager [phone]  If 7PM-7AM, please contact night-coverage [email or web address] Password [email or web address] 11/12/2015, 2:07 PM

## 2015-11-12 NOTE — Progress Notes (Signed)
ANTICOAGULATION CONSULT NOTE - Follow-up Consult  Pharmacy Consult for heparin IV to enoxaparin back to heparin 10/22 Indication: pulmonary embolus  No Known Allergies  Patient Measurements: Height: 5\' 11"  (180.3 cm) Weight: 222 lb 10.6 oz (101 kg) IBW/kg (Calculated) : 75.3 Heparin Dosing Weight: 96 kg  Vital Signs: Temp: 98.3 F (36.8 C) (10/22 1440) Temp Source: Oral (10/22 1440) BP: 123/57 (10/22 1440) Pulse Rate: 99 (10/22 1440)  Labs:  Recent Labs  11/10/15 0319 11/11/15 0559 11/12/15 0540 11/12/15 1700  HGB 9.1* 9.0* 9.4*  --   HCT 28.8* 28.8* 30.1*  --   PLT 395 369 395  --   HEPARINUNFRC  --   --   --  0.32  CREATININE 0.85 0.78 0.80  --     Estimated Creatinine Clearance: 83.2 mL/min (by C-G formula based on SCr of 0.8 mg/dL).  Assessment: 47 yoM with PMH DM, aortic stenosis, ocular melanoma and metastatic cancer of unknown primary with malignant ascites presents from home with shortness of breath.  CTa reveals submassive PE. Pharmacy to dose heparin IV for pulmonary embolism. Per MD, patient will likely need thoracentesis tomorrow AM.   Baseline INR = 1.18, aPTT = 31sec  Prior anticoagulation: none  Significant events: 10/19: heparin to LMWH for more predictable response 10/22: change from LMWH back to heparin gtt for biopsy 10/23 of peritoneal soft tissue mass by IR  Today, 11/12/2015:  Initial heparin level on 10/19 was subtherapeutic on heparin 1500 units/hr  CBC: Hgb low but remains stable; Plt wnl  Initial heparin level is 0.32, therapeutic  Goal of Therapy: Heparin level 0.3-0.7 LMWH anti-Xa level 0.6-1.0 units/mL Monitor platelets by anticoagulation protocol: Yes  Plan:  Continue  heparin gtt at 1800 units/hr based on previous heparin rate and corresponding level  Daily CBC  Per IR notes, wants heparin off x 3 hrs prior to procedure  Royetta Asal, PharmD, BCPS Pager [phone] 11/12/2015 6:21 PM

## 2015-11-13 ENCOUNTER — Inpatient Hospital Stay (HOSPITAL_COMMUNITY): Payer: Medicare Other

## 2015-11-13 DIAGNOSIS — I2602 Saddle embolus of pulmonary artery with acute cor pulmonale: Secondary | ICD-10-CM

## 2015-11-13 LAB — BASIC METABOLIC PANEL
ANION GAP: 8 (ref 5–15)
BUN: 12 mg/dL (ref 6–20)
CO2: 22 mmol/L (ref 22–32)
Calcium: 8.2 mg/dL — ABNORMAL LOW (ref 8.9–10.3)
Chloride: 103 mmol/L (ref 101–111)
Creatinine, Ser: 0.74 mg/dL (ref 0.61–1.24)
GLUCOSE: 154 mg/dL — AB (ref 65–99)
POTASSIUM: 3.9 mmol/L (ref 3.5–5.1)
Sodium: 133 mmol/L — ABNORMAL LOW (ref 135–145)

## 2015-11-13 LAB — CBC
HEMATOCRIT: 28.6 % — AB (ref 39.0–52.0)
HEMOGLOBIN: 9 g/dL — AB (ref 13.0–17.0)
MCH: 23.7 pg — ABNORMAL LOW (ref 26.0–34.0)
MCHC: 31.5 g/dL (ref 30.0–36.0)
MCV: 75.5 fL — ABNORMAL LOW (ref 78.0–100.0)
Platelets: 364 10*3/uL (ref 150–400)
RBC: 3.79 MIL/uL — AB (ref 4.22–5.81)
RDW: 16.1 % — ABNORMAL HIGH (ref 11.5–15.5)
WBC: 7.6 10*3/uL (ref 4.0–10.5)

## 2015-11-13 LAB — PROTIME-INR
INR: 1.24
PROTHROMBIN TIME: 15.7 s — AB (ref 11.4–15.2)

## 2015-11-13 LAB — GLUCOSE, CAPILLARY
GLUCOSE-CAPILLARY: 154 mg/dL — AB (ref 65–99)
GLUCOSE-CAPILLARY: 238 mg/dL — AB (ref 65–99)
GLUCOSE-CAPILLARY: 120 mg/dL — AB (ref 65–99)
Glucose-Capillary: 147 mg/dL — ABNORMAL HIGH (ref 65–99)

## 2015-11-13 LAB — HEPARIN LEVEL (UNFRACTIONATED): HEPARIN UNFRACTIONATED: 0.49 [IU]/mL (ref 0.30–0.70)

## 2015-11-13 MED ORDER — MIDAZOLAM HCL 2 MG/2ML IJ SOLN
INTRAMUSCULAR | Status: AC
  Filled 2015-11-13: qty 4

## 2015-11-13 MED ORDER — FENTANYL CITRATE (PF) 100 MCG/2ML IJ SOLN
INTRAMUSCULAR | Status: AC
  Filled 2015-11-13: qty 4

## 2015-11-13 MED ORDER — MIDAZOLAM HCL 2 MG/2ML IJ SOLN
0.5000 mg | INTRAMUSCULAR | Status: DC | PRN
  Administered 2015-11-13: 0.5 mg via INTRAVENOUS

## 2015-11-13 MED ORDER — FENTANYL CITRATE (PF) 100 MCG/2ML IJ SOLN
25.0000 ug | INTRAMUSCULAR | Status: DC | PRN
  Administered 2015-11-13: 25 ug via INTRAVENOUS

## 2015-11-13 MED ORDER — FLUMAZENIL 0.5 MG/5ML IV SOLN
INTRAVENOUS | Status: AC
  Filled 2015-11-13: qty 5

## 2015-11-13 MED ORDER — NALOXONE HCL 0.4 MG/ML IJ SOLN
INTRAMUSCULAR | Status: AC
  Filled 2015-11-13: qty 1

## 2015-11-13 NOTE — Care Management Important Message (Signed)
Important Message  Patient Details  Name: Martin Leblanc MRN: [id number] Date of Birth: 03-27-1929   Medicare Important Message Given:  Yes    Camillo Flaming 11/13/2015, 10:13 AMImportant Message  Patient Details  Name: Martin Leblanc MRN: [id number] Date of Birth: 07-Apr-1929   Medicare Important Message Given:  Yes    Camillo Flaming 11/13/2015, 10:13 AM

## 2015-11-13 NOTE — Progress Notes (Signed)
Nutrition Follow-up  DOCUMENTATION CODES:   Obesity unspecified  INTERVENTION:  - Continue Ensure Enlive BID (pt prefers strawberry flavor). - Continue to encourage PO intakes of meals and supplements. - Recommend diet liberalization from Carb Modified to Regular as pt has not been eating more than 50% of any meal.  - RD will continue to monitor for additional nutrition-related needs.  NUTRITION DIAGNOSIS:   Increased nutrient needs related to catabolic illness, cancer and cancer related treatments as evidenced by estimated needs. -ongoing  GOAL:   Patient will meet greater than or equal to 90% of their needs -unmet  MONITOR:   PO intake, Supplement acceptance, Weight trends, Labs, I & O's  ASSESSMENT:   82 y.o. 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  He was brought in by EMS for progressive SOB. In July he had admission to [facility] for chest pain and SOB.  He was found to have iron deficiency anemia, and his symptoms were attributed to this.  A CTA of the chest at that time was negative for PE but showed a pulmonary nodule, and follow-up imaging was recommended. Echo in July also showed grade II diastolic dysfunction, and mod to severe AS with a valve area of 0.95cm2.  He ultimately followed up in clinic with both cardiology and pulmonary, but no alternative causes for his symptoms were identified.  He saw his urologist in late August for LUTS and a bladder mass was identified; however, biopsies from this lesion were reportedly benign.  He continued to have shortness of breath, and decreased appetite (though he denies significant weight loss).  He requested colonoscopy by his GI provider, which revealed a mass at the cecum.  He was referred to Dr. Ninfa Linden for possible hemicolectomy, but repeat imaging of his abdomen and pelvis on 10/16 showed progression in size of the RLL lung mass concerning for metastatic disease, innumerable liver lesions concerning for  mets, focal thickening of the gastric wall, irregular thickening of the cecal wall, and ascites. He was no longer considered a surgical candidate and was referred to oncology. In the ED he was found to have mild hypoxia with new O2 requirement. He was also found to be in atrial fibrillation with RVR.  10/23 Per chart review, pt consumed 25% of breakfast and 0% of lunch on 10/21; 50% of breakfast and 25% of dinner yesterday. Pt was NPO this AM for biopsy and was eating lunch at the time of RD visit. Pt had consumed 50% of mashed potatoes, a few bites of pot roast, 75% of pinto beans, 100% of coleslaw, and a few sips of iced tea; strawberry yogurt untouched.  Pt denies chewing or swallowing issues and denies any experience of taste alteration or lack of taste. He reports poor appetite now which began a few weeks PTA, but he is unable to recall exactly when decrease in appetite and intakes began. Pt states that he feels full quickly. He denies abdominal pain/pressure or nausea with PO intakes. He states that he had been experiencing a change in his breathing/SOB PTA. He talks about this, recent hospitalizations, and "knowing something was not right" and goes on to talk about his request for a colonoscopy at which time he was informed of mass in his colon.   Unopened Ensure on bedside table. Pt states he has drank a few bottles over the past 3-4 days and is able to tolerate them although he does not particularly care for them; he does state  that he has liked the strawberry flavor the most. Encouraged pt to drink Ensure very cold or over ice.   Physical assessment shows no muscle or fat wasting at this time. No new weight since 10/19.  Medications reviewed; 325 mg ferrous sulfate BID, sliding scale Novolog, PRN Zofran, 40 mg oral Protonix/day, 17 g Miralax BID, 2 tablets Senokot at bedtime.  Labs reviewed; CBGs: 147 and 154 mg/dL today, Na: 133 mmol/L, Ca: 8.2 mg/dL.     10/20 - No intakes documented  since admission.  - Unable to see pt x3 attempted visits today and pt's daughter currently having in depth discussion with PT.  - Pt working with PT at time of second attempted visit and walked in the hallway.  - Notes indicates pt with poor appetite PTA.  - Will order Ensure Enlive to supplement and will follow-up on Monday for full assessment and adjust interventions as warranted at that time.  - Unable to complete physical assessment at this time and will complete at time of follow-up.  - Per chart review, pt has lost 7 lbs (3% body weight) in the past 1.5-2 months which is not significant for time frame.    Diet Order:  Diet Carb Modified Fluid consistency: Thin; Room service appropriate? Yes  Skin:  Reviewed, no issues  Last BM:  10/23  Height:   Ht Readings from Last 1 Encounters:  11/09/15 5\' 11"  (1.803 m)    Weight:   Wt Readings from Last 1 Encounters:  11/09/15 222 lb 10.6 oz (101 kg)    Ideal Body Weight:  78.18 kg  BMI:  Body mass index is 31.06 kg/m.  Estimated Nutritional Needs:   Kcal:  2020-2220 (20-22 kcal/kg)  Protein:  90-100 grams  Fluid:  >/= 2 L/day  EDUCATION NEEDS:   No education needs identified at this time    Jarome Matin, MS, RD, LDN Inpatient Clinical Dietitian Pager # [phone] After hours/weekend pager # [phone]

## 2015-11-13 NOTE — Progress Notes (Signed)
PROGRESS NOTE  Martin Leblanc  [id number] DOB: 12/19/1929 DOA: 11/08/2015 PCP: Sheela Stack, MD  Brief Narrative:   Martin Leblanc is a 82 y.o. gentleman with a history melanoma affecting his left eye, newly diagnosed metastatic adenocarcinoma likely colon primary, HTN, DM, and GERD who presented to the ED with progressive SOB.  He was found to have submassive PE, probable malignant ascites and lung mets.  He has an acute RLE DVT.  He was also in a-fib with RVR.  He was started on lovenox.  He had a paracentesis with removal of 4.8L of cloudy fluid on 10/19, however, cytology was nondiagnostic.  Planning CT-guided omental biopsy by radiology today.  Dr. Burr Medico from oncology has also requested additional tests be performed on the pathology specimen obtained by [facility] from his colonoscopy.  Will likely need oxygen at time of discharge.    Assessment & Plan:   Principal Problem:   Pulmonary embolus (HCC) Active Problems:   Dyspnea   Ascites   Atrial fibrillation with RVR (HCC)   Lung mass   Mass of cecum   Liver metastases (HCC)   Acute respiratory failure with hypoxia (HCC)   Aortic stenosis   PE (pulmonary thromboembolism) (HCC)   Metastatic cancer (HCC)  Acute respiratory failure secondary to submassive PE with acute hypoxia and hemodynamic instability, acute RIGHT lower extremity DVT. --  Okay to hold heparin peri-operatively:  Normally would not stop anticoagulation during first month, but due to aggressive and life-threatening nature of patient's metastatic cancer, it is important that we make a definite diagnosis of his cancer ASAP so he may start on treatment.  Patient aware of the risk of worsening or fatal PE during the time his anticoagulation is held.  Also aware of the risk of bleeding from omental biopsy.  Patient has repeatedly voiced understanding of these risks in the presence of myself, IR, and RN.   --  Echo:  Moderate LVH, preserved EF, no evidence of right heart  strain.    Paroxysmal a fib, CHADS-Vasc 4, currently NSR --Likely provoked by hypoxia and acute PE --Anticoagulated  --TSH within normal limits --Echo without regional wall motion abnl  Moderate aortic valve stenosis -  Follow up with cardiology   Metastatic adenocarcinoma, likely colon primary but large lung mass concerning for possible lung primary.  Pulmonology feels that appearance is not typical for lung primary, however, would like pathology to confirm.   -- Appreciate Dr. Ernestina Penna assistance -  Cytology from paracentesis was nondiagnostic -  Omental biopsy by IR today, NPO for procedure  DM type 2, A1c 8.3, CBG well controlled --Low carb diet, SSI  Iron deficiency anemia due to metastatic cancer.  B12, folate wnl -  feraheme x 1 on 10/20 -  Continue iron twice daily  Ascites, likely malignant based on CT scan, but cytology nondiagnostic -  Paracentesis performed with removal of 4.8L of yellow fluid on 10/19  Constipation, start miralax, senna, and bisacodyl, resolving -  At risk for colonic obstruction due to his malignancy.  Important to keep stools soft.  HTN, blood pressure low normal.  D/c ARB GERD, stable, continue PPI BPH, stable, continue cialis Hypokalemia, resolved with oral potassium supplementation  DVT prophylaxis:  Lovenox  Code Status:  Full code Family Communication:  Patient alone Disposition Plan:  Omental biopsy today.  Will resume heparin gtt post-procedure in case of any bleeding complications.  Monitor for at least 24-48 hours on heparin gtt and if no signs of  bleeding, can transition to lovenox   Consultants:   PC CM  Oncology, Dr. Burr Medico  IR  Procedures:  Lower extremity duplex on 10/19:  Acute right lower extremity DVT Paracentesis on 10/19:  Removal of 4.8 L of yellow fluid Echocardiogram:  Preserved EF, moderate AVS  Antimicrobials:   None    Subjective: Had some small BMs.  Abdomen feels more distended.  Asking about whether  he will need oxygen for home.      Objective: Vitals:   11/12/15 2047 11/13/15 0447 11/13/15 0952 11/13/15 1258  BP: 123/64 124/73 120/71 112/67  Pulse: (!) 102 95 88 88  Resp: 18 20 20 17   Temp: 98.6 F (37 C) 98.4 F (36.9 C) 98 F (36.7 C)   TempSrc: Oral Oral Oral   SpO2: 93% 94% 93% 92%  Weight:      Height:        Intake/Output Summary (Last 24 hours) at 11/13/15 1306 Last data filed at 11/13/15 0915  Gross per 24 hour  Intake            420.3 ml  Output              475 ml  Net            -54.7 ml   Filed Weights   11/08/15 1858 11/09/15 0415  Weight: 99.8 kg (220 lb) 101 kg (222 lb 10.6 oz)    Examination:  General exam:  Adult Male. NAD, nasal cannula in place HEENT:  NCAT, MMM Respiratory system: Diminished at the bilateral bases, otherwise clear to auscultation bilaterally Cardiovascular system: Regular rate and rhythm, normal S1/S2. No murmurs, rubs, gallops or clicks.  Warm extremities Gastrointestinal system: Normal active bowel sounds, palpable mass along bowels to the right of the umbilicus, 20 cm in length, nontender.  Belly somewhat more distended today but not tense MSK:  Normal tone and bulk, no lower extremity edema Neuro:  Grossly moves all extremities    Data Reviewed: I have personally reviewed following labs and imaging studies  CBC:  Recent Labs Lab 11/08/15 2000 11/09/15 0338 11/10/15 0319 11/11/15 0559 11/12/15 0540 11/13/15 0446  WBC 10.3 8.6 7.7 7.6 7.1 7.6  NEUTROABS 8.3*  --   --   --   --   --   HGB 9.6* 8.8* 9.1* 9.0* 9.4* 9.0*  HCT 30.5* 27.7* 28.8* 28.8* 30.1* 28.6*  MCV 75.3* 74.7* 75.0* 73.5* 75.1* 75.5*  PLT 402* 382 395 369 395 [id number]   Basic Metabolic Panel:  Recent Labs Lab 11/09/15 0315 11/09/15 0338 11/10/15 0319 11/11/15 0559 11/12/15 0540 11/13/15 0446  NA  --  135 137 136 135 133*  K  --  3.4* 3.8 3.9 3.9 3.9  CL  --  105 107 105 102 103  CO2  --  23 24 24 25 22   GLUCOSE  --  118* 135* 117* 152*  154*  BUN  --  11 10 10 11 12   CREATININE  --  0.90 0.85 0.78 0.80 0.74  CALCIUM  --  8.9 8.5* 8.4* 8.6* 8.2*  MG 1.7  --   --   --   --   --    GFR: Estimated Creatinine Clearance: 83.2 mL/min (by C-G formula based on SCr of 0.74 mg/dL). Liver Function Tests:  Recent Labs Lab 11/08/15 2000  AST 36  ALT 24  ALKPHOS 104  BILITOT 0.8  PROT 6.9  ALBUMIN 2.9*    Recent Labs Lab 11/08/15 2000  LIPASE 13   No results for input(s): AMMONIA in the last 168 hours. Coagulation Profile:  Recent Labs Lab 11/08/15 1953 11/13/15 0446  INR 1.18 1.24   Cardiac Enzymes:  Recent Labs Lab 11/09/15 0315 11/09/15 0642 11/09/15 1315  TROPONINI 0.03* 0.03* 0.03*   BNP (last 3 results) No results for input(s): PROBNP in the last 8760 hours. HbA1C: No results for input(s): HGBA1C in the last 72 hours. CBG:  Recent Labs Lab 11/12/15 0745 11/12/15 1117 11/12/15 1633 11/12/15 2215 11/13/15 0839  GLUCAP 160* 249* 199* 150* 154*   Lipid Profile: No results for input(s): CHOL, HDL, LDLCALC, TRIG, CHOLHDL, LDLDIRECT in the last 72 hours. Thyroid Function Tests: No results for input(s): TSH, T4TOTAL, FREET4, T3FREE, THYROIDAB in the last 72 hours. Anemia Panel: No results for input(s): VITAMINB12, FOLATE, FERRITIN, TIBC, IRON, RETICCTPCT in the last 72 hours. Urine analysis:    Component Value Date/Time   COLORURINE YELLOW 07/25/2015 1257   APPEARANCEUR TURBID (A) 07/25/2015 1257   LABSPEC 1.019 07/25/2015 1257   PHURINE 6.5 07/25/2015 1257   GLUCOSEU 500 (A) 07/25/2015 1257   HGBUR NEGATIVE 07/25/2015 1257   BILIRUBINUR NEGATIVE 07/25/2015 1257   KETONESUR NEGATIVE 07/25/2015 1257   PROTEINUR NEGATIVE 07/25/2015 1257   NITRITE NEGATIVE 07/25/2015 1257   LEUKOCYTESUR MODERATE (A) 07/25/2015 1257   Sepsis Labs: @LABRCNTIP (procalcitonin:4,lacticidven:4)  ) Recent Results (from the past 240 hour(s))  MRSA PCR Screening     Status: None   Collection Time: 11/09/15   4:20 AM  Result Value Ref Range Status   MRSA by PCR NEGATIVE NEGATIVE Final    Comment:        The GeneXpert MRSA Assay (FDA approved for NASAL specimens only), is one component of a comprehensive MRSA colonization surveillance program. It is not intended to diagnose MRSA infection nor to guide or monitor treatment for MRSA infections.   Body fluid culture     Status: None   Collection Time: 11/09/15 10:45 AM  Result Value Ref Range Status   Specimen Description FLUID ASCITES  Final   Special Requests NONE  Final   Gram Stain   Final    FEW WBC PRESENT, PREDOMINANTLY MONONUCLEAR NO ORGANISMS SEEN    Culture   Final    NO GROWTH 3 DAYS Performed at [facility]    Report Status 11/12/2015 FINAL  Final      Radiology Studies: No results found.   Scheduled Meds: . atorvastatin  20 mg Oral Daily  . clotrimazole   Topical BID  . darifenacin  15 mg Oral Daily  . doxazosin  4 mg Oral Daily  . feeding supplement (ENSURE ENLIVE)  237 mL Oral BID BM  . fentaNYL      . ferrous sulfate  325 mg Oral BID WC  . flumazenil      . insulin aspart  0-15 Units Subcutaneous TID WC  . midazolam      . naloxone      . pantoprazole  40 mg Oral Daily  . polyethylene glycol  17 g Oral BID  . senna  2 tablet Oral QHS  . sodium chloride flush  3 mL Intravenous Q12H   Continuous Infusions: . heparin Stopped (11/13/15 0900)     LOS: 5 days    Time spent: 30 min    Janece Canterbury, MD [facility] Pager [phone]  If 7PM-7AM, please contact night-coverage [email or web address] Password [email or web address] 11/13/2015, 1:06 PM

## 2015-11-13 NOTE — Progress Notes (Signed)
PT Cancellation Note  Patient Details Name: Martin Leblanc MRN: [id number] DOB: 1929/03/18   Cancelled Treatment:    Reason Eval/Treat Not Completed: Other (comment) Pt on 2 hours bedrest s/p CT guided core biopsy of omental tumor   Joelie Schou,KATHrine E 11/13/2015, 2:30 PM Carmelia Bake, PT, DPT 11/13/2015 Pager: [phone]

## 2015-11-13 NOTE — Progress Notes (Signed)
ANTICOAGULATION CONSULT NOTE - Follow-up Consult  Pharmacy Consult for heparin IV to enoxaparin back to heparin 10/22 Indication: pulmonary embolus  No Known Allergies  Patient Measurements: Height: 5\' 11"  (180.3 cm) Weight: 222 lb 10.6 oz (101 kg) IBW/kg (Calculated) : 75.3 Heparin Dosing Weight: 96 kg  Vital Signs: Temp: 98 F (36.7 C) (10/23 0952) Temp Source: Oral (10/23 0952) BP: 123/59 (10/23 1346) Pulse Rate: 94 (10/23 1346)  Labs:  Recent Labs  11/11/15 0559 11/12/15 0540 11/12/15 1700 11/13/15 0446  HGB 9.0* 9.4*  --  9.0*  HCT 28.8* 30.1*  --  28.6*  PLT 369 395  --  364  LABPROT  --   --   --  15.7*  INR  --   --   --  1.24  HEPARINUNFRC  --   --  0.32 0.49  CREATININE 0.78 0.80  --  0.74    Estimated Creatinine Clearance: 83.2 mL/min (by C-G formula based on SCr of 0.74 mg/dL).  Assessment: 21 yoM with PMH DM, aortic stenosis, ocular melanoma and metastatic cancer of unknown primary with malignant ascites presents from home with shortness of breath.  CTa reveals submassive PE. Pharmacy to dose heparin IV for pulmonary embolism.    Baseline INR = 1.18, aPTT = 31sec  Prior anticoagulation: none  Significant events: 10/19: Dx thoracentesis; then heparin to LMWH for more predictable response 10/22: change from LMWH back to heparin gtt for biopsy 10/23 of peritoneal soft tissue mass by IR 10/23: IR guided Bx; heparin turned off at 0900  Today, 11/13/2015:  Current heparin level therapeutic on 1800 units/hr but currently off for Bx  CBC: Hgb low but remains stable; Plt wnl  No bleeding or line issues per RN; minimal procedural blood loss  CrCl wnl  Goal of Therapy: Heparin level 0.3-0.7 Monitor platelets by anticoagulation protocol: Yes  Plan:  Per IR, ok to resume heparin IV at 3pm at previous rate of 1800 units/hr  Daily heparin level, CBC  Monitor for signs of bleeding or thrombosis  Plan is to transition to Lovenox once stable on  heparin for 24-48 hr  Reuel Boom, PharmD, BCPS Pager: [phone] 11/13/2015, 2:04 PM

## 2015-11-13 NOTE — Progress Notes (Signed)
[facility] PCCM  Chart reviewed, Mr. Bossert had a PE in the setting of metastatis malignancy.  He remains hemodynamically stable, Hgb stable on anticoagulation.  PCCM will sign off, call if questions.  Roselie Awkward, MD [facility] Pager: [phone] Cell: [phone] After 3pm or if no response, call [phone]

## 2015-11-13 NOTE — Procedures (Signed)
Interventional Radiology Procedure Note  Procedure: CT guided core biopsy of omental tumor  Complications: None  Estimated Blood Loss:  < 10 mL  18 G core biopsy x 3 of anterior peritoneal/omental tumor performed under CT guidance.  Venetia Night. Kathlene Cote, M.D Pager:  [phone]

## 2015-11-14 ENCOUNTER — Inpatient Hospital Stay (HOSPITAL_COMMUNITY): Payer: Medicare Other

## 2015-11-14 DIAGNOSIS — R180 Malignant ascites: Secondary | ICD-10-CM

## 2015-11-14 DIAGNOSIS — Z515 Encounter for palliative care: Secondary | ICD-10-CM

## 2015-11-14 DIAGNOSIS — D509 Iron deficiency anemia, unspecified: Secondary | ICD-10-CM

## 2015-11-14 DIAGNOSIS — Z7189 Other specified counseling: Secondary | ICD-10-CM

## 2015-11-14 DIAGNOSIS — C799 Secondary malignant neoplasm of unspecified site: Secondary | ICD-10-CM

## 2015-11-14 LAB — GLUCOSE, CAPILLARY
GLUCOSE-CAPILLARY: 243 mg/dL — AB (ref 65–99)
GLUCOSE-CAPILLARY: 165 mg/dL — AB (ref 65–99)
GLUCOSE-CAPILLARY: 237 mg/dL — AB (ref 65–99)
Glucose-Capillary: 141 mg/dL — ABNORMAL HIGH (ref 65–99)

## 2015-11-14 LAB — BASIC METABOLIC PANEL
Anion gap: 6 (ref 5–15)
BUN: 11 mg/dL (ref 6–20)
CHLORIDE: 105 mmol/L (ref 101–111)
CO2: 24 mmol/L (ref 22–32)
CREATININE: 0.81 mg/dL (ref 0.61–1.24)
Calcium: 8.3 mg/dL — ABNORMAL LOW (ref 8.9–10.3)
GFR calc Af Amer: >60 mL/min (ref >60)
GFR calc non Af Amer: >60 mL/min (ref >60)
GLUCOSE: 139 mg/dL — AB (ref 65–99)
POTASSIUM: 3.9 mmol/L (ref 3.5–5.1)
Sodium: 135 mmol/L (ref 135–145)

## 2015-11-14 LAB — CBC
HEMATOCRIT: 28.5 % — AB (ref 39.0–52.0)
Hemoglobin: 8.9 g/dL — ABNORMAL LOW (ref 13.0–17.0)
MCH: 23.8 pg — AB (ref 26.0–34.0)
MCHC: 31.2 g/dL (ref 30.0–36.0)
MCV: 76.2 fL — AB (ref 78.0–100.0)
PLATELETS: 336 10*3/uL (ref 150–400)
RBC: 3.74 MIL/uL — ABNORMAL LOW (ref 4.22–5.81)
RDW: 16.8 % — AB (ref 11.5–15.5)
WBC: 7.6 10*3/uL (ref 4.0–10.5)

## 2015-11-14 LAB — HEPARIN LEVEL (UNFRACTIONATED): Heparin Unfractionated: 0.44 IU/mL (ref 0.30–0.70)

## 2015-11-14 MED ORDER — ENOXAPARIN SODIUM 100 MG/ML ~~LOC~~ SOLN
1.0000 mg/kg | Freq: Two times a day (BID) | SUBCUTANEOUS | Status: DC
  Administered 2015-11-14 – 2015-11-16 (×4): 100 mg via SUBCUTANEOUS
  Filled 2015-11-14 (×4): qty 1

## 2015-11-14 MED ORDER — BISACODYL 10 MG RE SUPP
10.0000 mg | Freq: Every day | RECTAL | Status: DC | PRN
  Administered 2015-11-14: 10 mg via RECTAL
  Filled 2015-11-14: qty 1

## 2015-11-14 NOTE — Progress Notes (Signed)
SATURATION QUALIFICATIONS: (This note is used to comply with regulatory documentation for home oxygen)  Patient Saturations on Room Air at Rest 89%  Patient Saturations on Room Air while Ambulating 84%  Patient Saturations on 3.0 Liters of oxygen while Ambulating 93% Please briefly explain why patient needs home oxygen:  Pt ambulated him hall about 50 feet, sats dropped to 81-84%--pt did not c/o of extreme SOB and was not gasping for air, however O2 replaced during the walk sats increased to 93% then, finally when pt settle down, O2 on 3l--sats 93-95%. SRP, RN

## 2015-11-14 NOTE — Care Management Note (Signed)
Case Management Note  Patient Details  Name: ANTAVIUS BURACKER MRN: [id number] Date of Birth: [birth year]  Subjective/Objective:  82 y/o m admitted w/Pulmonary embolus. Mets Colon. Full code.PT-recc HHPT.Palliative cons-await recc.                   Action/Plan:d/c plan home.   Expected Discharge Date:   (unknown)               Expected Discharge Plan:  [place]  In-House Referral:     Discharge planning Services  CM Consult  Post Acute Care Choice:    Choice offered to:     DME Arranged:    DME Agency:     HH Arranged:    HH Agency:     Status of Service:  In process, will continue to follow  If discussed at Long Length of Stay Meetings, dates discussed:    Additional Comments:  Dessa Phi, RN 11/14/2015, 3:35 PM

## 2015-11-14 NOTE — Consult Note (Signed)
Consultation Note Date: 11/14/2015   Patient Name: Martin Leblanc  DOB: 11-14-1929  MRN: [id number]  Age / Sex: 82 y.o., male  PCP: Reynold Bowen, MD Referring Physician: Janece Canterbury, MD  Reason for Consultation: Establishing goals of care  HPI/Patient Profile: 82 y.o. male  with past medical history of melanoma affecting his left eye, metastatic adenocarcinoma (likely colon primary), HTN, DM, and GERD who presented to the ED with progressive SOB. He was found to have submassive PE, probable malignant ascites and lung mets as well as acute RLE DVT. He had a paracentesis with removal of 4.8L of cloudy fluid on 10/19, however, cytology was nondiagnostic.  He subsequently underwent an omental biopsy on 11/13/2015 which confirmed metastatic adenocarcinoma, colon primary. Dr. Burr Medico has been seeing patient to discuss treatment options.  Palliative consulted for goals of care.    Clinical Assessment and Goals of Care: Met today with patient and his 2 daughters.  Patient reports getting a lot of information today and was not very talkative during time of our encounter. We did review prior advance care planning he had done including naming healthcare power of attorney (see copy on chart). We also reviewed his living will.  We reviewed his clinical course to this point in time as well as the fact that neither he nor his family have a time to really process the information due to short period of time since he initially found out that he had adenocarcinoma. He reports understanding plan for treatment is to "do with they can" but treatment is not curative in intent.  We discussed the goal of any medical care to be to add time and quality to his life.  We also briefly discussed that there may come a time when further disease modifying therapy is not likely to add to his goal of living as well as possible for as long as  possible. Briefly touched on the fact this does not mean that there is no care left to give, however, it means we need to focus care on aggressive symptom management in order to preserve his functional status and allow him to live each day as well as possible. Family trust Dr. Burr Medico to help guide them in this decision process as they proceed forward with his care.  We discussed that in light of multiple chronic medical problems that have worsened with this acute problem, care should be focused on interventions that are likely to allow the patient to achieve goal of getting back to home and spending time with family. I discussed with patient and family regarding heroic interventions at the end-of-life and they agree this would not be in line with prior expressed wishes for a natural death or be likely to lead to getting well enough to go back home. They were in agreement with changing CODE STATUS to DO NOT RESUSCITATE.  SUMMARY OF RECOMMENDATIONS   - DNR/DNI. Durable DNR on chart. - Plan for follow-up with Dr. Burr Medico to discuss options for treatment moving forward. Patient and family understand  palliative rather than curative intent of treatment. He would like to meet with her again to discuss further prior to making any other decisions about long-term goals. - He is going to skilled facility. Recommend palliative care services to follow at skilled facility if possible. - I dropped off a copy of hard choices for loving people for his daughter to review  Code Status/Advance Care Planning:  DNR  Palliative Prophylaxis:   Bowel Regimen and Frequent Pain Assessment  Psycho-social/Spiritual:   Desire for further Chaplaincy support:no  Additional Recommendations: Education on Hospice  Prognosis:   Unable to determine at this time.  His daughter did ask me in the hallway about prognosis and I shared with her that without disease modifying therapy, I would anticipate his prognosis to be less than 6  months and he ought to qualify for hospice support once he is not likely to benefit from further disease modifying therapy.  Discharge Planning: [facility] for rehab with Palliative care service follow-up      Primary Diagnoses: Present on Admission: . Pulmonary embolus ([place]) . Dyspnea . Ascites . PE (pulmonary thromboembolism) ([place])   I have reviewed the medical record, interviewed the patient and family, and examined the patient. The following aspects are pertinent.  Past Medical History:  Diagnosis Date  . Diabetes mellitus without complication (HCC)    Insulin use  . Edema extremities    right greater than left feet.  Marland Kitchen GERD (gastroesophageal reflux disease)   . Hypertension   . Malignant melanoma ([place])    s/p treatment 25 yrs ago-left eye  . Shortness of breath dyspnea    recent had hospital visit 7'17 [facility]- slightly improved.  . Sight impaired    left eye- hx melanoma/radiation   Social History   Social History  . Marital status: Married    Spouse name: N/A  . Number of children: 4  . Years of education: N/A   Social History Main Topics  . Smoking status: Former Smoker    Packs/day: 0.50    Years: 20.00    Types: Cigarettes    Quit date: 09/06/1973  . Smokeless tobacco: Never Used     Comment: quit 45 years ago  . Alcohol use No  . Drug use: No  . Sexual activity: Not Currently   Other Topics Concern  . 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 Diabetes Maternal Grandmother   . Emphysema Father    Scheduled Meds: . atorvastatin  20 mg Oral Daily  . clotrimazole   Topical BID  . darifenacin  15 mg Oral Daily  . doxazosin  4 mg Oral Daily  . enoxaparin (LOVENOX) injection  1 mg/kg Subcutaneous BID  . feeding supplement (ENSURE ENLIVE)  237 mL Oral BID BM  . ferrous sulfate   325 mg Oral BID WC  . insulin aspart  0-15 Units Subcutaneous TID WC  . pantoprazole  40 mg Oral Daily  . polyethylene glycol  17 g Oral BID  . senna  2 tablet Oral QHS  . sodium chloride flush  3 mL Intravenous Q12H   Continuous Infusions: . heparin 1,800 Units/hr (11/14/15 0833)   PRN Meds:.acetaminophen **OR** acetaminophen, antiseptic oral rinse, artificial tears, bisacodyl, ondansetron **OR** ondansetron (ZOFRAN) IV, tadalafil Medications Prior to Admission:  Prior to Admission medications  Medication Sig Start Date End Date Taking? Authorizing Provider  albuterol (PROVENTIL HFA;VENTOLIN HFA) 108 (90 Base) MCG/ACT inhaler Inhale 2 puffs into the lungs every 4 (four) hours as needed for wheezing or shortness of breath. 09/13/15  Yes Collene Gobble, MD  amLODipine-benazepril (LOTREL) 5-10 MG capsule Take 1 capsule by mouth daily. 09/11/15  Yes Jerline Pain, MD  antiseptic oral rinse (BIOTENE) LIQD 15 mLs by Mouth Rinse route as needed for dry mouth.   Yes Historical Provider, MD  atorvastatin (LIPITOR) 20 MG tablet Take 20 mg by mouth daily.   Yes Historical Provider, MD  CIALIS 5 MG tablet Take 5 mg by mouth at bedtime as needed for urinary retention. 09/04/15  Yes Historical Provider, MD  docusate sodium (COLACE) 100 MG capsule Take 100 mg by mouth daily as needed for mild constipation.   Yes Historical Provider, MD  doxazosin (CARDURA) 4 MG tablet Take 4 mg by mouth daily.   Yes Historical Provider, MD  ferrous sulfate 325 (65 FE) MG tablet Take 1 tablet (325 mg total) by mouth daily with breakfast. 07/26/15  Yes Carly J Rivet, MD  HUMALOG MIX 50/50 KWIKPEN (50-50) 100 UNIT/ML Kwikpen Inject 40-50 Units into the skin See admin instructions. Pt uses 40 units at breakfast and dinner and if BS is high, uses 50 units at bedtime 07/14/15  Yes Historical Provider, MD  Hypromellose (ARTIFICIAL TEARS OP) Place 1 drop into both eyes daily as needed (dry eyes).   Yes Historical Provider, MD  losartan  (COZAAR) 100 MG tablet Take 100 mg by mouth daily. 08/29/15  Yes Historical Provider, MD  omeprazole (PRILOSEC) 20 MG capsule Take 20 mg by mouth daily.   Yes Historical Provider, MD  solifenacin (VESICARE) 10 MG tablet Take 10 mg by mouth daily.   Yes Historical Provider, MD  Specialty Vitamins Products (PROSTATE PO) Take 1 capsule by mouth daily.   Yes Historical Provider, MD   No Known Allergies Review of Systems  Constitutional: Positive for activity change, appetite change, fatigue and unexpected weight change.  Respiratory: Positive for shortness of breath.   Cardiovascular: Positive for leg swelling.  Gastrointestinal: Positive for abdominal distention, abdominal pain and constipation.  Neurological: Positive for weakness.  Psychiatric/Behavioral: Positive for sleep disturbance.    Physical Exam  General: Alert, awake, in no acute distress.  HEENT: No bruits, no goiter, no JVD Heart: Regular rate and rhythm. No murmur appreciated. Lungs: Diminished at bases but otherwise good air movement, clear Abdomen: nontender, nondistended (s/p paracentesis to per 2.5 L), positive bowel sounds.  Ext: No significant edema Skin: Warm and dry Neuro: Grossly intact, nonfocal.   Vital Signs: BP (!) 107/59 (BP Location: Left Arm)   Pulse 90   Temp 98.5 F (36.9 C) (Oral)   Resp 16   Ht _0  (1.803 m)   Wt 101 kg (222 lb 10.6 oz)   SpO2 93%   BMI 31.06 kg/m  Pain Assessment: 0-10 POSS *See Group Information*: S-Acceptable,Sleep, easy to arouse Pain Score: 0-No pain   SpO2: SpO2: 93 % O2 Device:SpO2: 93 % O2 Flow Rate: .O2 Flow Rate (L/min): 3 L/min  IO: Intake/output summary:  Intake/Output Summary (Last 24 hours) at 11/14/15 1806 Last data filed at 11/14/15 1441  Gross per 24 hour  Intake            792.3 ml  Output              850 ml  Net            -  57.7 ml    LBM: Last BM Date: 11/11/15 (pt stated) Baseline Weight: Weight: 99.8 kg (220 lb) Most recent weight:  Weight: 101 kg (222 lb 10.6 oz)     Palliative Assessment/Data:   Flowsheet Rows   Flowsheet Row Most Recent Value  Intake Tab  Referral Department  Hospitalist  Unit at Time of Referral  Med/Surg Unit  Palliative Care Primary Diagnosis  Cancer  Date Notified  11/13/15  Palliative Care Type  New Palliative care  Reason for referral  Clarify Goals of Care  Date of Admission  11/08/15  Date first seen by Palliative Care  11/14/15  # of days Palliative referral response time  1 Day(s)  # of days IP prior to Palliative referral  5  Clinical Assessment  Palliative Performance Scale Score  40%  Pain Max last 24 hours  7  Pain Min Last 24 hours  2  Psychosocial & Spiritual Assessment  Palliative Care Outcomes  Patient/Family meeting held?  Yes  Who was at the meeting?  Patient, 2 daughters  Palliative Care Outcomes  Clarified goals of care, Completed durable DNR, Changed CPR status  Palliative Care follow-up planned  Yes, Facility      Time In: 1700 Time Out: 1800 Time Total: 60 Greater than 50%  of this time was spent counseling and coordinating care related to the above assessment and plan.  Signed by: Micheline Rough, MD   Please contact Palliative Medicine Team phone at [phone] for questions and concerns.  For individual provider: See Shea Evans

## 2015-11-14 NOTE — Procedures (Signed)
Successful US guided paracentesis from R:Q.  Yielded 2.5L of blood-tinged fluid.  No immediate complications.  Pt tolerated well.   Specimen was not sent for labs.  Ascencion Dike PA-C 11/14/2015 4:21 PM

## 2015-11-14 NOTE — Progress Notes (Signed)
Martin Leblanc   DOB:11/04/1929   VO#:[id number]   MMN#:[id number]  Oncology follow up  Subjective: Pt underwent peritoneal mass biopsy by interventional radiology yesterday, and paracentesis today. He is able to ambulate to the station, but has not been up and move around much. He denies significant pain, does complain of low appetite and fatigue. His 2 daughters were in the room when I visited pt.    Objective:  Vitals:   11/14/15 1700 11/14/15 2113  BP: (!) 107/59 (!) 90/52  Pulse: 90 91  Resp: 16 16  Temp: 98.5 F (36.9 C) 97.6 F (36.4 C)    Body mass index is 31.06 kg/m.  Intake/Output Summary (Last 24 hours) at 11/14/15 2310 Last data filed at 11/14/15 2114  Gross per 24 hour  Intake            759.9 ml  Output              750 ml  Net              9.9 ml     Sclerae unicteric  Oropharynx clear  No peripheral adenopathy  Lungs clear -- no rales or rhonchi  Heart regular rate and rhythm  Abdomen soft, non-tender, (+) ascites   MSK no focal spinal tenderness, no peripheral edema  Neuro nonfocal    CBG (last 3)   Recent Labs  11/14/15 1137 11/14/15 1647 11/14/15 2222  GLUCAP 243* 165* 237*     Labs:  Lab Results  Component Value Date   WBC 7.6 11/14/2015   HGB 8.9 (L) 11/14/2015   HCT 28.5 (L) 11/14/2015   MCV 76.2 (L) 11/14/2015   PLT 336 11/14/2015   NEUTROABS 8.3 (H) 11/08/2015   CMP Latest Ref Rng & Units 11/14/2015 11/13/2015 11/12/2015  Glucose 65 - 99 mg/dL 139(H) 154(H) 152(H)  BUN 6 - 20 mg/dL 11 12 11   Creatinine 0.61 - 1.24 mg/dL 0.81 0.74 0.80  Sodium 135 - 145 mmol/L 135 133(L) 135  Potassium 3.5 - 5.1 mmol/L 3.9 3.9 3.9  Chloride 101 - 111 mmol/L 105 103 102  CO2 22 - 32 mmol/L 24 22 25   Calcium 8.9 - 10.3 mg/dL 8.3(L) 8.2(L) 8.6(L)  Total Protein 6.5 - 8.1 g/dL - - -  Total Bilirubin 0.3 - 1.2 mg/dL - - -  Alkaline Phos 38 - 126 U/L - - -  AST 15 - 41 U/L - - -  ALT 17 - 63 U/L - - -    Urine Studies No results for input(s):  UHGB, CRYS in the last 72 hours.  Invalid input(s): UACOL, UAPR, USPG, UPH, UTP, UGL, [place], UBIL, UNIT, UROB, [place], UEPI, UWBC, Junie Panning [place], [place], [place]  Basic Metabolic Panel:  Recent Labs Lab 11/09/15 0315  11/10/15 0319 11/11/15 0559 11/12/15 0540 11/13/15 0446 11/14/15 0529  NA  --   < > 137 136 135 133* 135  K  --   < > 3.8 3.9 3.9 3.9 3.9  CL  --   < > 107 105 102 103 105  CO2  --   < > 24 24 25 22 24   GLUCOSE  --   < > 135* 117* 152* 154* 139*  BUN  --   < > 10 10 11 12 11   CREATININE  --   < > 0.85 0.78 0.80 0.74 0.81  CALCIUM  --   < > 8.5* 8.4* 8.6* 8.2* 8.3*  MG 1.7  --   --   --   --   --   --   < > =  values in this interval not displayed. GFR Estimated Creatinine Clearance: 82.2 mL/min (by C-G formula based on SCr of 0.81 mg/dL). Liver Function Tests:  Recent Labs Lab 11/08/15 2000  AST 36  ALT 24  ALKPHOS 104  BILITOT 0.8  PROT 6.9  ALBUMIN 2.9*    Recent Labs Lab 11/08/15 2000  LIPASE 13   No results for input(s): AMMONIA in the last 168 hours. Coagulation profile  Recent Labs Lab 11/08/15 1953 11/13/15 0446  INR 1.18 1.24    CBC:  Recent Labs Lab 11/08/15 2000  11/10/15 0319 11/11/15 0559 11/12/15 0540 11/13/15 0446 11/14/15 0529  WBC 10.3  < > 7.7 7.6 7.1 7.6 7.6  NEUTROABS 8.3*  --   --   --   --   --   --   HGB 9.6*  < > 9.1* 9.0* 9.4* 9.0* 8.9*  HCT 30.5*  < > 28.8* 28.8* 30.1* 28.6* 28.5*  MCV 75.3*  < > 75.0* 73.5* 75.1* 75.5* 76.2*  PLT 402*  < > 395 369 395 364 336  < > = values in this interval not displayed. Cardiac Enzymes:  Recent Labs Lab 11/09/15 0315 11/09/15 0642 11/09/15 1315  TROPONINI 0.03* 0.03* 0.03*   BNP: Invalid input(s): POCBNP CBG:  Recent Labs Lab 11/13/15 2221 11/14/15 0759 11/14/15 1137 11/14/15 1647 11/14/15 2222  GLUCAP 120* 141* 243* 165* 237*   D-Dimer No results for input(s): DDIMER in the last 72 hours. Hgb A1c No results for input(s): HGBA1C in the last 72  hours. Lipid Profile No results for input(s): CHOL, HDL, LDLCALC, TRIG, CHOLHDL, LDLDIRECT in the last 72 hours. Thyroid function studies No results for input(s): TSH, T4TOTAL, T3FREE, THYROIDAB in the last 72 hours.  Invalid input(s): FREET3 Anemia work up No results for input(s): VITAMINB12, FOLATE, FERRITIN, TIBC, IRON, RETICCTPCT in the last 72 hours. Microbiology Recent Results (from the past 240 hour(s))  MRSA PCR Screening     Status: None   Collection Time: 11/09/15  4:20 AM  Result Value Ref Range Status   MRSA by PCR NEGATIVE NEGATIVE Final    Comment:        The GeneXpert MRSA Assay (FDA approved for NASAL specimens only), is one component of a comprehensive MRSA colonization surveillance program. It is not intended to diagnose MRSA infection nor to guide or monitor treatment for MRSA infections.   Body fluid culture     Status: None   Collection Time: 11/09/15 10:45 AM  Result Value Ref Range Status   Specimen Description FLUID ASCITES  Final   Special Requests NONE  Final   Gram Stain   Final    FEW WBC PRESENT, PREDOMINANTLY MONONUCLEAR NO ORGANISMS SEEN    Culture   Final    NO GROWTH 3 DAYS Performed at [facility]    Report Status 11/12/2015 FINAL  Final   CYTOLOGY  Diagnosis PERITONEAL/ASCITIC FLUID (SPECIMEN 1 OF 1 COLLECTED 11/09/15): ATYPICAL CELLS PRESENT, SEE COMMENT.  Diagnosis 11/13/2015 Peritoneum, biopsy, anterior and omental - ADENOCARCINOMA. - SEE DESCRIPTION. Microscopic Comment The morphologic features are most consistent with metastatic colorectal adenocarcinoma. Immunohistochemistry will be performed and reported as an addendum. Comment There are rare atypical cells present. However, additonal tissue sampling will be helpful for a definitive diagnosis  Studies:  US Paracentesis  Result Date: 11/14/2015 INDICATION: Metastatic adenocarcinoma. Recurrent abdominal ascites. Request therapeutic paracentesis. EXAM:  ULTRASOUND GUIDED RIGHT LOWER QUADRANT PARACENTESIS MEDICATIONS: None. COMPLICATIONS: None immediate. PROCEDURE: Informed written consent was obtained from the  patient after a discussion of the risks, benefits and alternatives to treatment. A timeout was performed prior to the initiation of the procedure. Initial ultrasound scanning demonstrates a large amount of ascites within the right lower abdominal quadrant. The right lower abdomen was prepped and draped in the usual sterile fashion. 1% lidocaine with epinephrine was used for local anesthesia. Following this, a 19 gauge, 7-cm, Yueh catheter was introduced. An ultrasound image was saved for documentation purposes. The paracentesis was performed. The catheter was removed and a dressing was applied. The patient tolerated the procedure well without immediate post procedural complication. FINDINGS: A total of approximately 2.5 L of blood-tinged fluid was removed. IMPRESSION: Successful ultrasound-guided paracentesis yielding 2.5 liters of peritoneal fluid. Read by: Ascencion Dike PA-C Electronically Signed   By: Aletta Edouard M.D.   On: 11/14/2015 16:23   Ct Biopsy  Result Date: 11/13/2015 CLINICAL DATA:  Peritoneal carcinomatosis with bulky omental and peritoneal tumor. The patient presents for biopsy. EXAM: CT GUIDED CORE BIOPSY OF PERITONEAL MASS ANESTHESIA/SEDATION: 0.5 mg IV Versed; 25 mcg IV Fentanyl Total Moderate Sedation Time:  10 minutes. The patient's level of consciousness and physiologic status were continuously monitored during the procedure by Radiology nursing. PROCEDURE: The procedure risks, benefits, and alternatives were explained to the patient. Questions regarding the procedure were encouraged and answered. The patient understands and consents to the procedure. A time-out was performed prior to initiating the procedure. The anterior abdominal wall was prepped with chlorhexidine in a sterile fashion, and a sterile drape was applied covering  the operative field. A sterile gown and sterile gloves were used for the procedure. Local anesthesia was provided with 1% Lidocaine. Under CT guidance, a 17 gauge needle was advanced into the anterior peritoneal cavity near the midline. Three separate coaxial 18 gauge core biopsy samples were obtained and submitted in formalin. COMPLICATIONS: None FINDINGS: Bulky tumor in the anterior peritoneal cavity immediately deep to the abdominal wall was targeted. Solid tissue was obtained. IMPRESSION: CT-guided core biopsy performed of peritoneal tumor. Electronically Signed   By: Aletta Edouard M.D.   On: 11/13/2015 16:10    Assessment: 82 y.o. male with recently diagnosed right colon cancer, presented with progressive dyspnea, fatigue, and abdominal bloating. CT scan showed diffuse liver, peritoneum metastasis, ascites and a large right lower lobe lung mass.  1. Metastatic colon cancer  2. Iron deficient anemia 3. Ascites, likely malignant, status post paracentesis twice 4. Deconditioning 5. Hypertension 6. Diabetes 7. Anorexia and fatigue   Recommendations: -I reviewed his peritoneal mass biopsy result, which is consistent with metastatic colon cancer. Immunostain results are pending. -I request MMR test on his colon mass biopsy, if his peritoneal mass tissue is sufficient, I'll request foundation One test also, to see if he is a candidate for immunotherapy or EGFR inhibitor  -due to his poor PS, and advanced age, I do not think he is a candidate for chemotherapy -We reviewed the incurable nature of his cancer with patient and his daughters, and discussed the goal of cure is palliation, to try to preserve his quality of life -We discussed symptom management. I recommend him to try Marinol 2.5 mg twice daily, for his low appetite.  -He is likely going to a SNF, I plan to see him back next week in my office  -He may need repeated paracentesis in a few weeks, I'll set up for him as outpatient -I spoke  with hospitalist Dr. Nathanial Rancher, MD 11/14/2015

## 2015-11-14 NOTE — Progress Notes (Addendum)
PROGRESS NOTE  Martin Leblanc  [id number] DOB: 1929-03-10 DOA: 11/08/2015 PCP: Sheela Stack, MD  Brief Narrative:   Martin Leblanc is a 82 y.o. gentleman with a history melanoma affecting his left eye, newly diagnosed metastatic adenocarcinoma likely colon primary, HTN, DM, and GERD who presented to the ED with progressive SOB.  He was found to have submassive PE, probable malignant ascites and lung mets.  He has an acute RLE DVT.  He was also in a-fib with RVR.  He was started on lovenox.  He had a paracentesis with removal of 4.8L of cloudy fluid on 10/19, however, cytology was nondiagnostic.  He subsequently underwent an omental biopsy on 11/13/2015 which confirmed metastatic adenocarcinoma, colon primary.  Continuing heparin for 24 hours post-biopsy while monitoring for bleeding complications.  Hemoglobin is approximately stable today and no abdominal pain.  Planning to resume lovenox this evening.  Dr. Burr Medico from oncology will meet again with family today to discuss treatment options.    Assessment & Plan:   Principal Problem:   Pulmonary embolus (HCC) Active Problems:   Dyspnea   Ascites   Atrial fibrillation with RVR (HCC)   Lung mass   Mass of cecum   Liver metastases (HCC)   Acute respiratory failure with hypoxia (HCC)   Aortic stenosis   PE (pulmonary thromboembolism) (HCC)   Metastatic cancer (HCC)  Acute respiratory failure with hypoxia and hemodynamic instability secondary to submassive PE and acute RIGHT lower extremity DVT.  Echo:  Moderate LVH, preserved EF, no evidence of right heart strain.   -  Plan to resume lovenox this evening with BID dosing -  RN to continue teaching on lovenox injections for patient and family -  Will require 3L oxygen at discharge   Paroxysmal a fib, CHADS-Vasc 4, currently NSR --Likely provoked by hypoxia and acute PE --Anticoagulation as above --TSH within normal limits --Echo without regional wall motion abnl  Moderate aortic  valve stenosis -  Follow up with cardiology   Metastatic adenocarcinoma, colon primary with omental, liver, lung metastases. -  MRI brain negative for mets -- Appreciate Dr. Ernestina Penna assistance:  She will discuss biopsy results today with family -  Palliative care consult pending  DM type 2, A1c 8.3, CBG well controlled --Low carb diet, SSI  Iron deficiency anemia due to metastatic cancer.  B12, folate wnl -  feraheme x 1 on 10/20 -  Continue iron twice daily  Ascites, likely malignant based on CT scan, but cytology nondiagnostic -  Paracentesis performed with removal of 4.8L of yellow fluid on 10/19 -  Patient requesting paracentesis today  Constipation, continue miralax, senna, and bisacodyl, resolving -  At risk for colonic obstruction due to his malignancy.  Important to keep stools soft.  HTN, blood pressure low normal.  D/c ARB GERD, stable, continue PPI BPH, stable, continue cialis Hypokalemia, resolved with oral potassium supplementation  DVT prophylaxis:  Lovenox  Code Status:  Full code Family Communication:  Patient alone Disposition Plan:  Continue heparin gtt for 24h and resume lovenox tonight.  PT to reeval.  Previously recommended HH, however, he is very deconditioned today with RN.  SW consult placed because anticipate he will need SNF at discharge.  Likely discharge on 10/25.    Consultants:   [facility], Dr. Burr Medico  IR  Procedures:  Lower extremity duplex on 10/19:  Acute right lower extremity DVT Paracentesis on 10/19:  Removal of 4.8 L of yellow fluid Echocardiogram:  Preserved  EF, moderate AVS  Antimicrobials:   None    Subjective: Abdomen feels mildly distended, but no pain or soreness after his biopsy yesterday.    Objective: Vitals:   11/13/15 1346 11/13/15 2057 11/14/15 0531 11/14/15 0838  BP: (!) 123/59 116/61 109/61 115/71  Pulse: 94 94 89 85  Resp: 18 19 16 16   Temp:  98.8 F (37.1 C) 97.6 F (36.4 C) 97.8 F (36.6 C)    TempSrc:  Oral Oral Oral  SpO2: 95% 94% 95% 94%  Weight:      Height:        Intake/Output Summary (Last 24 hours) at 11/14/15 1414 Last data filed at 11/14/15 0900  Gross per 24 hour  Intake            12566 ml  Output              850 ml  Net            11716 ml   Filed Weights   11/08/15 1858 11/09/15 0415  Weight: 99.8 kg (220 lb) 101 kg (222 lb 10.6 oz)    Examination:  General exam:  Adult Male. NAD, nasal cannula in place HEENT:  NCAT, MMM Respiratory system: Diminished at the bilateral bases, otherwise clear to auscultation bilaterally Cardiovascular system: Regular rate and rhythm, normal S1/S2. No murmurs, rubs, gallops or clicks.  Warm extremities Gastrointestinal system: Normal active bowel sounds, palpable mass along bowels to the right of the umbilicus, 20 cm in length, nontender.  Belly somewhat distended today but not tense MSK:  Normal tone and bulk, no lower extremity edema Neuro:  Grossly moves all extremities    Data Reviewed: I have personally reviewed following labs and imaging studies  CBC:  Recent Labs Lab 11/08/15 2000  11/10/15 0319 11/11/15 0559 11/12/15 0540 11/13/15 0446 11/14/15 0529  WBC 10.3  < > 7.7 7.6 7.1 7.6 7.6  NEUTROABS 8.3*  --   --   --   --   --   --   HGB 9.6*  < > 9.1* 9.0* 9.4* 9.0* 8.9*  HCT 30.5*  < > 28.8* 28.8* 30.1* 28.6* 28.5*  MCV 75.3*  < > 75.0* 73.5* 75.1* 75.5* 76.2*  PLT 402*  < > 395 369 395 364 336  < > = values in this interval not displayed. Basic Metabolic Panel:  Recent Labs Lab 11/09/15 0315  11/10/15 0319 11/11/15 0559 11/12/15 0540 11/13/15 0446 11/14/15 0529  NA  --   < > 137 136 135 133* 135  K  --   < > 3.8 3.9 3.9 3.9 3.9  CL  --   < > 107 105 102 103 105  CO2  --   < > 24 24 25 22 24   GLUCOSE  --   < > 135* 117* 152* 154* 139*  BUN  --   < > 10 10 11 12 11   CREATININE  --   < > 0.85 0.78 0.80 0.74 0.81  CALCIUM  --   < > 8.5* 8.4* 8.6* 8.2* 8.3*  MG 1.7  --   --   --   --   --    --   < > = values in this interval not displayed. GFR: Estimated Creatinine Clearance: 82.2 mL/min (by C-G formula based on SCr of 0.81 mg/dL). Liver Function Tests:  Recent Labs Lab 11/08/15 2000  AST 36  ALT 24  ALKPHOS 104  BILITOT 0.8  PROT 6.9  ALBUMIN  2.9*    Recent Labs Lab 11/08/15 2000  LIPASE 13   No results for input(s): AMMONIA in the last 168 hours. Coagulation Profile:  Recent Labs Lab 11/08/15 1953 December 09, 2015 0446  INR 1.18 1.24   Cardiac Enzymes:  Recent Labs Lab 11/09/15 0315 11/09/15 0642 11/09/15 1315  TROPONINI 0.03* 0.03* 0.03*   BNP (last 3 results) No results for input(s): PROBNP in the last 8760 hours. HbA1C: No results for input(s): HGBA1C in the last 72 hours. CBG:  Recent Labs Lab 2015/12/09 1400 2015/12/09 1723 12-09-2015 2221 11/14/15 0759 11/14/15 1137  GLUCAP 147* 238* 120* 141* 243*   Lipid Profile: No results for input(s): CHOL, HDL, LDLCALC, TRIG, CHOLHDL, LDLDIRECT in the last 72 hours. Thyroid Function Tests: No results for input(s): TSH, T4TOTAL, FREET4, T3FREE, THYROIDAB in the last 72 hours. Anemia Panel: No results for input(s): VITAMINB12, FOLATE, FERRITIN, TIBC, IRON, RETICCTPCT in the last 72 hours. Urine analysis:    Component Value Date/Time   COLORURINE YELLOW 07/25/2015 1257   APPEARANCEUR TURBID (A) 07/25/2015 1257   LABSPEC 1.019 07/25/2015 1257   PHURINE 6.5 07/25/2015 1257   GLUCOSEU 500 (A) 07/25/2015 1257   HGBUR NEGATIVE 07/25/2015 1257   BILIRUBINUR NEGATIVE 07/25/2015 1257   KETONESUR NEGATIVE 07/25/2015 1257   PROTEINUR NEGATIVE 07/25/2015 1257   NITRITE NEGATIVE 07/25/2015 1257   LEUKOCYTESUR MODERATE (A) 07/25/2015 1257   Sepsis Labs: @LABRCNTIP (procalcitonin:4,lacticidven:4)  ) Recent Results (from the past 240 hour(s))  MRSA PCR Screening     Status: None   Collection Time: 11/09/15  4:20 AM  Result Value Ref Range Status   MRSA by PCR NEGATIVE NEGATIVE Final    Comment:          The GeneXpert MRSA Assay (FDA approved for NASAL specimens only), is one component of a comprehensive MRSA colonization surveillance program. It is not intended to diagnose MRSA infection nor to guide or monitor treatment for MRSA infections.   Body fluid culture     Status: None   Collection Time: 11/09/15 10:45 AM  Result Value Ref Range Status   Specimen Description FLUID ASCITES  Final   Special Requests NONE  Final   Gram Stain   Final    FEW WBC PRESENT, PREDOMINANTLY MONONUCLEAR NO ORGANISMS SEEN    Culture   Final    NO GROWTH 3 DAYS Performed at [facility]    Report Status 11/12/2015 FINAL  Final      Radiology Studies: Ct Biopsy  Result Date: 12/09/2015 CLINICAL DATA:  Peritoneal carcinomatosis with bulky omental and peritoneal tumor. The patient presents for biopsy. EXAM: CT GUIDED CORE BIOPSY OF PERITONEAL MASS ANESTHESIA/SEDATION: 0.5 mg IV Versed; 25 mcg IV Fentanyl Total Moderate Sedation Time:  10 minutes. The patient's level of consciousness and physiologic status were continuously monitored during the procedure by Radiology nursing. PROCEDURE: The procedure risks, benefits, and alternatives were explained to the patient. Questions regarding the procedure were encouraged and answered. The patient understands and consents to the procedure. A time-out was performed prior to initiating the procedure. The anterior abdominal wall was prepped with chlorhexidine in a sterile fashion, and a sterile drape was applied covering the operative field. A sterile gown and sterile gloves were used for the procedure. Local anesthesia was provided with 1% Lidocaine. Under CT guidance, a 17 gauge needle was advanced into the anterior peritoneal cavity near the midline. Three separate coaxial 18 gauge core biopsy samples were obtained and submitted in formalin. COMPLICATIONS: None FINDINGS: Bulky tumor  in the anterior peritoneal cavity immediately deep to the abdominal wall was  targeted. Solid tissue was obtained. IMPRESSION: CT-guided core biopsy performed of peritoneal tumor. Electronically Signed   By: Aletta Edouard M.D.   On: 11/13/2015 16:10     Scheduled Meds: . atorvastatin  20 mg Oral Daily  . clotrimazole   Topical BID  . darifenacin  15 mg Oral Daily  . doxazosin  4 mg Oral Daily  . enoxaparin (LOVENOX) injection  1 mg/kg Subcutaneous BID  . feeding supplement (ENSURE ENLIVE)  237 mL Oral BID BM  . ferrous sulfate  325 mg Oral BID WC  . insulin aspart  0-15 Units Subcutaneous TID WC  . pantoprazole  40 mg Oral Daily  . polyethylene glycol  17 g Oral BID  . senna  2 tablet Oral QHS  . sodium chloride flush  3 mL Intravenous Q12H   Continuous Infusions: . heparin 1,800 Units/hr (11/14/15 0833)     LOS: 6 days    Time spent: 30 min    Janece Canterbury, MD [facility] Pager [phone]  If 7PM-7AM, please contact night-coverage [email or web address] Password [email or web address] 11/14/2015, 2:14 PM

## 2015-11-14 NOTE — Progress Notes (Signed)
Physical Therapy Treatment Patient Details Name: SAFI RYKER MRN: [id number] DOB: 05-30-1929 Today's Date: 11/14/2015    History of Present Illness 82 y.o. male admitted with dyspnea, Dx of pulmonary embolism, RLE DVT. PMH of widely metastatic cancer (colon, lung, liver, ? primary), COPD, DM, GIB.    PT Comments    Pt was found in bed upon arrival with family present. Required minA to rise from supine to sitting on EOB with hand held support. Pt ambulated 80 ft with a RW and 3L of supplementary O2. O2 sats dropped to 92% and HR to 108 bpm during ambulation. Required VC's for safe foot placement and sequencing with a walker during gait trial due to patient having very unsteady gait. Pt displayed limited activity tolerance and endurance due to fatigue and dyspnea.  Follow Up Recommendations  SNF  Pt will need ST Rehab at SNF prior to safe D/C to home Will consult LPT  Equipment Recommendations  Rolling walker with 5" wheels    Recommendations for Other Services       Precautions / Restrictions Precautions Precautions: Fall Precaution Comments: monitor O2 Restrictions Weight Bearing Restrictions: No    Mobility  Bed Mobility Overal bed mobility: Needs Assistance Bed Mobility: Supine to Sit     Supine to sit: Min assist     General bed mobility comments: min A with rising from supine to sit with hand held assist.  Transfers Overall transfer level: Needs assistance Equipment used: Rolling walker (2 wheeled) Transfers: Sit to/from Stand Sit to Stand: Min assist         General transfer comment: VC's required for safe hand placement when sitting and standing to avoid the walker from tipping.  Ambulation/Gait Ambulation/Gait assistance: Min guard Ambulation Distance (Feet): 80 Feet Assistive device: Rolling walker (2 wheeled) Gait Pattern/deviations: Decreased stride length;Decreased step length - right;Decreased step length - left;Step-through pattern;Trunk  flexed Gait velocity: decreased Gait velocity interpretation: Below normal speed for age/gender General Gait Details: O2 dropped to 92% while ambulating with a RW on 3 L of supplementary 02.  HR 118. No complaints of fatigue or SOB, but did show signs of dyspnea toward end of ambulation. Educated on pursed lip breathing.  Required VC's for safety awareness with foot placement to stay within walker during ambulation and maneuvering turns.    Stairs            Wheelchair Mobility    Modified Rankin (Stroke Patients Only)       Balance                                    Cognition Arousal/Alertness: Awake/alert Behavior During Therapy: Flat affect Overall Cognitive Status: Within Functional Limits for tasks assessed                      Exercises      General Comments        Pertinent Vitals/Pain Pain Assessment: No/denies pain    Home Living                      Prior Function            PT Goals (current goals can now be found in the care plan section) Progress towards PT goals: Progressing toward goals    Frequency    Min 3X/week      PT Plan  Co-evaluation             End of Session Equipment Utilized During Treatment: Gait belt;Oxygen Activity Tolerance: Patient limited by fatigue Patient left: in bed;with call bell/phone within reach;with family/visitor present     Time: [id number] PT Time Calculation (min) (ACUTE ONLY): 10 min  Charges:  $Gait Training: 8-22 mins                    G Codes:      Hall Busing, SPTA [facility] Acute Rehab [phone]  Present and agree with above  Rica Koyanagi  PTA [facility]  Acute  Rehab Pager      [phone]

## 2015-11-14 NOTE — Progress Notes (Signed)
ANTICOAGULATION CONSULT NOTE - Follow Up Consult  Pharmacy Consult for Heparin Indication: pulmonary embolus  No Known Allergies  Patient Measurements: Height: 5\' 11"  (180.3 cm) Weight: 222 lb 10.6 oz (101 kg) IBW/kg (Calculated) : 75.3 Heparin Dosing Weight:   Vital Signs: Temp: 97.6 F (36.4 C) (10/24 0531) Temp Source: Oral (10/24 0531) BP: 109/61 (10/24 0531) Pulse Rate: 89 (10/24 0531)  Labs:  Recent Labs  11/12/15 0540 11/12/15 1700 11/13/15 0446 11/14/15 0529  HGB 9.4*  --  9.0* 8.9*  HCT 30.1*  --  28.6* 28.5*  PLT 395  --  364 336  LABPROT  --   --  15.7*  --   INR  --   --  1.24  --   HEPARINUNFRC  --  0.32 0.49 0.44  CREATININE 0.80  --  0.74 0.81    Estimated Creatinine Clearance: 82.2 mL/min (by C-G formula based on SCr of 0.81 mg/dL).   Medications:  Infusions:  . heparin 1,800 Units/hr (11/13/15 1930)    Assessment: Patient with heparin level at goal.  No heparin issues noted.  Goal of Therapy:  Heparin level 0.3-0.7 units/ml Monitor platelets by anticoagulation protocol: Yes   Plan:  Continue heparin drip at current rate Recheck level with 10/25 AM labs  Tyler Deis, Shea Stakes [place] 11/14/2015,6:33 AM

## 2015-11-14 NOTE — Progress Notes (Signed)
ANTICOAGULATION CONSULT NOTE - Follow-up Consult  Pharmacy Consult for heparin IV >> enoxaparin Indication: pulmonary embolus  No Known Allergies  Patient Measurements: Height: 5\' 11"  (180.3 cm) Weight: 222 lb 10.6 oz (101 kg) IBW/kg (Calculated) : 75.3  Vital Signs: Temp: 97.8 F (36.6 C) (10/24 0838) Temp Source: Oral (10/24 0838) BP: 115/71 (10/24 0838) Pulse Rate: 85 (10/24 0838)  Labs:  Recent Labs  11/12/15 0540 11/12/15 1700 11/13/15 0446 11/14/15 0529  HGB 9.4*  --  9.0* 8.9*  HCT 30.1*  --  28.6* 28.5*  PLT 395  --  364 336  LABPROT  --   --  15.7*  --   INR  --   --  1.24  --   HEPARINUNFRC  --  0.32 0.49 0.44  CREATININE 0.80  --  0.74 0.81    Estimated Creatinine Clearance: 82.2 mL/min (by C-G formula based on SCr of 0.81 mg/dL).  Assessment: 77 yoM with PMH DM, aortic stenosis, ocular melanoma and metastatic cancer of unknown primary with malignant ascites presents from home with shortness of breath.  CTa reveals submassive PE. Pharmacy to dose heparin IV for pulmonary embolism.    Baseline INR = 1.18, aPTT = 31sec  Prior anticoagulation: none  Significant events: 10/19: Dx thoracentesis; then heparin to LMWH for more predictable response 10/22: change from LMWH back to heparin gtt for biopsy 10/23 of peritoneal soft tissue mass by IR 10/23: IR guided Bx; heparin turned off at 0900, turned back on at 1500 10/24: OK to transition back to Lovenox as stable on heparin x 24 hr  Today, 11/14/2015:  AM heparin level therapeutic on 1800 units/hr  CBC: Hgb low but remains stable; Plt wnl  No bleeding or line issues per RN  CrCl 82 ml/min  Goal of Therapy:  Heparin level 0.3-0.7 Monitor platelets by anticoagulation protocol: Yes  Plan:  Continue heparin at 1800 units/hr until 8pm tonight  Begin Lovenox 100 mg SQ q12 hr starting 1 hr after heparin turned off  CBC q72 hr, SCr at least weekly while on Lovenox  Continue to monitor for signs of  bleeding or thrombosis  Reuel Boom, PharmD, BCPS Pager: [phone] 11/14/2015, 1:22 PM

## 2015-11-15 LAB — BASIC METABOLIC PANEL
Anion gap: 6 (ref 5–15)
BUN: 12 mg/dL (ref 6–20)
CHLORIDE: 102 mmol/L (ref 101–111)
CO2: 24 mmol/L (ref 22–32)
Calcium: 8.3 mg/dL — ABNORMAL LOW (ref 8.9–10.3)
Creatinine, Ser: 0.76 mg/dL (ref 0.61–1.24)
Glucose, Bld: 180 mg/dL — ABNORMAL HIGH (ref 65–99)
POTASSIUM: 3.9 mmol/L (ref 3.5–5.1)
SODIUM: 132 mmol/L — AB (ref 135–145)

## 2015-11-15 LAB — CBC
HEMATOCRIT: 28.6 % — AB (ref 39.0–52.0)
Hemoglobin: 8.9 g/dL — ABNORMAL LOW (ref 13.0–17.0)
MCH: 23.8 pg — ABNORMAL LOW (ref 26.0–34.0)
MCHC: 31.1 g/dL (ref 30.0–36.0)
MCV: 76.5 fL — ABNORMAL LOW (ref 78.0–100.0)
PLATELETS: 314 10*3/uL (ref 150–400)
RBC: 3.74 MIL/uL — AB (ref 4.22–5.81)
RDW: 17.3 % — AB (ref 11.5–15.5)
WBC: 7.8 10*3/uL (ref 4.0–10.5)

## 2015-11-15 LAB — GLUCOSE, CAPILLARY
GLUCOSE-CAPILLARY: 251 mg/dL — AB (ref 65–99)
GLUCOSE-CAPILLARY: 152 mg/dL — AB (ref 65–99)
GLUCOSE-CAPILLARY: 151 mg/dL — AB (ref 65–99)
Glucose-Capillary: 162 mg/dL — ABNORMAL HIGH (ref 65–99)

## 2015-11-15 MED ORDER — DRONABINOL 2.5 MG PO CAPS
2.5000 mg | ORAL_CAPSULE | Freq: Two times a day (BID) | ORAL | Status: DC
  Administered 2015-11-15 – 2015-11-16 (×2): 2.5 mg via ORAL
  Filled 2015-11-15 (×2): qty 1

## 2015-11-15 NOTE — Discharge Summary (Addendum)
Physician Discharge Summary  BAIN LEWI [id number] DOB: [birth year] DOA: 11/08/2015  PCP: Sheela Stack, MD  Admit date: 11/08/2015 Discharge date: 11/16/2015  Admitted From: HOME. Disposition:  SNF.  Recommendations for Outpatient Follow-up:  1. Follow up with PCP in 1-2 weeks 2. Please obtain BMP/CBC in one week 3. Please follow up with palliative care services.  4. Please follow up with oncology as recommended.     Discharge Condition:guarded.  CODE STATUS:DNR. Diet recommendation: regular.   Brief/Interim Summary: ESIAH COFFELL a 82 y.o.gentleman with a history melanoma affecting his left eye, newly diagnosed metastatic adenocarcinoma likely colon primary, HTN, DM, and GERD who presented to the ED with progressive SOB. He was found to have submassive PE, probable malignant ascites and lung mets.  He has an acute RLE DVT.  He was also in a-fib with RVR.  He was started on lovenox.  He had a paracentesis with removal of 4.8L of cloudy fluid on 10/19, however, cytology was nondiagnostic.  He subsequently underwent an omental biopsy on 11/13/2015 which confirmed metastatic adenocarcinoma, colon primary.  IV heparin changed to lovenox. Repeat paracentesis done on 10/24.    Discharge Diagnoses:  Principal Problem:   Pulmonary embolus (HCC) Active Problems:   Dyspnea   Ascites   Atrial fibrillation with RVR (HCC)   Lung mass   Mass of cecum   Liver metastases (HCC)   Acute respiratory failure with hypoxia (HCC)   Aortic stenosis   PE (pulmonary thromboembolism) (HCC)   Metastatic cancer (HCC)  Acute respiratory failure with hypoxia and hemodynamic instability secondary to submassive PE and acute RIGHT lower extremity DVT.  Echo:  Moderate LVH, preserved EF, no evidence of right heart strain.   -  Plan to resume lovenox  with BID dosing -  Will require 3L oxygen at discharge   Paroxysmal a fib, CHADS-Vasc 4, currently NSR --Likely provoked by hypoxia and acute  PE --Anticoagulation as above --TSH within normal limits --Echo without regional wall motion abnl - rate better controlled and NSR.   Moderate aortic valve stenosis -  Follow up with cardiology   Metastatic adenocarcinoma, colon primary with omental, liver, lung metastases. -  MRI brain negative for mets -- Appreciate Dr. Ernestina Penna assistance:  -  Palliative care consulted recommended palliative care services to follow at SNF.   DM type 2, A1c 8.3, CBG well controlled --Low carb diet, SSI CBG (last 3)   Recent Labs (last 2 labs)    Recent Labs  11/15/15 0744 11/15/15 1144 11/15/15 1631  GLUCAP 162* 251* 152*        Iron deficiency anemia due to metastatic cancer.  B12, folate wnl -  Continue iron twice daily  Ascites, likely malignant based on CT scan, but cytology nondiagnostic -  Recurrent paracentesis required as needed.  Constipation, continue miralax, senna, and bisacodyl, resolving -  At risk for colonic obstruction due to his malignancy.  Important to keep stools soft.  HTN, blood pressure low normal.  D/c ARB GERD, stable, continue PPI BPH, stable, continue cialis Hypokalemia, resolved with oral potassium supplementation     Discharge Instructions    Discharge instructions    Complete by:  As directed    Please follow up with Palliative care services as recommended.  Please follow up with oncology as recommended.         Medication List    STOP taking these medications   amLODipine-benazepril 5-10 MG capsule Commonly known as:  Burgin  50/50 KWIKPEN (50-50) 100 UNIT/ML Kwikpen Generic drug:  Insulin Lispro Prot & Lispro   losartan 100 MG tablet Commonly known as:  COZAAR     TAKE these medications   albuterol 108 (90 Base) MCG/ACT inhaler Commonly known as:  PROVENTIL HFA;VENTOLIN HFA Inhale 2 puffs into the lungs every 4 (four) hours as needed for wheezing or shortness of breath.   antiseptic oral rinse Liqd 15  mLs by Mouth Rinse route as needed for dry mouth.   ARTIFICIAL TEARS OP Place 1 drop into both eyes daily as needed (dry eyes).   atorvastatin 20 MG tablet Commonly known as:  LIPITOR Take 20 mg by mouth daily.   CIALIS 5 MG tablet Generic drug:  tadalafil Take 5 mg by mouth at bedtime as needed for urinary retention.   clotrimazole 1 % cream Commonly known as:  LOTRIMIN Apply topically 2 (two) times daily.   docusate sodium 100 MG capsule Commonly known as:  COLACE Take 100 mg by mouth daily as needed for mild constipation.   doxazosin 4 MG tablet Commonly known as:  CARDURA Take 4 mg by mouth daily.   dronabinol 2.5 MG capsule Commonly known as:  MARINOL Take 1 capsule (2.5 mg total) by mouth 2 (two) times daily before lunch and supper.   enoxaparin 150 MG/ML injection Commonly known as:  LOVENOX Inject 0.67 mLs (100 mg total) into the skin 2 (two) times daily.   ferrous sulfate 325 (65 FE) MG tablet Take 1 tablet (325 mg total) by mouth daily with breakfast.   insulin aspart 100 UNIT/ML injection Commonly known as:  novoLOG Inject 0-15 Units into the skin 3 (three) times daily with meals.   omeprazole 20 MG capsule Commonly known as:  PRILOSEC Take 20 mg by mouth daily.   polyethylene glycol packet Commonly known as:  MIRALAX / GLYCOLAX Take 17 g by mouth 2 (two) times daily.   PROSTATE PO Take 1 capsule by mouth daily.   solifenacin 10 MG tablet Commonly known as:  VESICARE Take 10 mg by mouth daily.      No Known Allergies  Consultations:  Palliative care consult  Oncology  IR.    Procedures/Studies: Ct Angio Chest Pe W And/or Wo Contrast  Result Date: 11/08/2015 CLINICAL DATA:  Acute onset of worsening shortness of breath. Current history of metastatic cancer within the lung and liver. Initial encounter. EXAM: CT ANGIOGRAPHY CHEST WITH CONTRAST TECHNIQUE: Multidetector CT imaging of the chest was performed using the standard protocol  during bolus administration of intravenous contrast. Multiplanar CT image reconstructions and MIPs were obtained to evaluate the vascular anatomy. CONTRAST:  100 mL of Isovue 370 IV contrast COMPARISON:  CT of the abdomen and pelvis from 11/06/2015, and CTA of the chest performed 07/24/2015 FINDINGS: Cardiovascular: Pulmonary embolus is noted within the right main pulmonary artery, extending into all lobes of the right lung, and also within the left main pulmonary artery, extending into all lobes of the left lung. The RV/LV ratio of 1.1 corresponds to right heart strain and concern for submassive pulmonary embolus. Calcification is noted at the aortic and mitral valves. Diffuse coronary artery calcifications are seen. Scattered calcification is seen along the aortic arch and descending thoracic aorta. The great vessels are grossly unremarkable in appearance. Mediastinum/Nodes: A prominent subcarinal node is noted, 1.4 cm in short axis. No pericardial effusion is seen. The thyroid gland is unremarkable in appearance. No axillary lymphadenopathy is appreciated. Lungs/Pleura: Scattered bilateral pulmonary nodules are seen, predominantly  at the right lower lobe, the largest of which measures 4.9 cm in size. Underlying trace bilateral pleural effusions are noted, with bibasilar opacities likely reflecting atelectasis. A 5 mm pulmonary nodule is noted at the right upper lobe (image 37 of 92). A pulmonary nodule is also noted at the left upper lobe (image 20 of 92). No pneumothorax is seen. Upper Abdomen: Innumerable hypodense lesions are seen throughout the liver, compatible with metastatic disease. The visualized portions of the spleen are unremarkable. Moderate volume ascites is seen at the upper abdomen. Irregular wall thickening is noted at the patient's hiatal hernia, which could conceivably reflect underlying mass, or intraluminal contents. Musculoskeletal: No acute osseous abnormalities are identified. Mild  degenerative change is noted at the lower cervical and upper thoracic spine. The visualized musculature is unremarkable in appearance. Review of the MIP images confirms the above findings. IMPRESSION: 1. Pulmonary embolus noted within both main pulmonary arteries, extending into all lobes of both lungs. Positive for acute PE with CT evidence of right heart strain (RV/LV Ratio = 1.1) consistent with at least submassive (intermediate risk) PE. The presence of right heart strain has been associated with an increased risk of morbidity and mortality. Please activate Code PE by paging [phone]. 2. 4.9 cm mass at the right lower lobe. Scattered additional pulmonary nodules noted bilaterally, most prominent at the right lower lobe. On correlation with prior studies, this may reflect a bronchogenic primary malignancy with associated metastases. 3. Innumerable metastases noted throughout the liver. 4. Prominent subcarinal node, measuring 1.4 cm in short axis common may reflect metastatic disease. 5. Diffuse coronary artery calcifications seen. 6. Moderate ascites at the upper abdomen. 7. Irregular wall thickening again noted at the hiatal hernia. This could reflect underlying mass, given clinical concern, or simply intraluminal contents. However, it appears to be new from July, whereas the right lower lobe lung mass has gradually increased in size, and thus primary malignancy at the hiatal hernia is considered less likely. Critical Value/emergent results were called by telephone at the time of interpretation on 11/08/2015 at 9:21 pm to Dr. Shirlyn Goltz, who verbally acknowledged these results. Electronically Signed   By: Garald Balding M.D.   On: 11/08/2015 21:53   Mr Jeri Cos [id number] Contrast  Result Date: 11/10/2015 CLINICAL DATA:  82 year old male with metastatic melanoma. Acute pulmonary emboli. Left ocular melanoma status post brachytherapy. Staging. Subsequent encounter. EXAM: MRI HEAD WITHOUT AND WITH CONTRAST  TECHNIQUE: Multiplanar, multiecho pulse sequences of the brain and surrounding structures were obtained without and with intravenous contrast. CONTRAST:  34mL MULTIHANCE GADOBENATE DIMEGLUMINE 529 MG/ML IV SOLN COMPARISON:  Chest CTA 11/08/2015. FINDINGS: Brain: Evidence of intravenous iron or other diamagnetic intravascular substance simulating the appearance of contrast even on the noncontrast portion of the exam, and degrading T2* imaging. Still, there is no evidence of intracranial mass or abnormal enhancement. No intracranial mass effect. No evidence of dural thickening. Right anterior superior frontal gyrus cortical encephalomalacia with mild white matter gliosis. Small areas of cortical encephalomalacia also in the anterior left superior frontal gyrus (series 7, image 20) and in the left posterior occipital lobe (series 6, image 9). Elsewhere minimal to mild for age nonspecific cerebral white matter T2 and FLAIR hyperintensity. Deep gray matter nuclei and brainstem are within normal limits. There are probably several tiny chronic lacunar infarcts in both cerebellar hemispheres. No restricted diffusion to suggest acute infarction. No midline shift, mass effect, ventriculomegaly, extra-axial collection or acute intracranial hemorrhage. Cervicomedullary junction and pituitary are within  normal limits. Vascular: Major intracranial vascular flow voids are preserved, dominant distal left vertebral artery. Skull and upper cervical spine: Negative visualized cervical spinal cord. There is heterogeneous bone marrow signal throughout the skull and upper cervical spine, but no destructive osseous lesion identified. Sinuses/Orbits: Abnormal left globe with 10 mm decreased T2 signal and increased T1 or enhancing signal (series 6, image 8). The other left intraorbital soft tissues remain normal. The right orbit appears normal. Mild paranasal sinus mucosal thickening. Other: Visible internal auditory structures appear  normal. Mastoids are clear. Negative scalp soft tissues. IMPRESSION: 1. Mild artifact related to intravenous iron or other diet magnetic intravascular substance. However, the study is diagnostic with No metastatic disease to the brain or acute intracranial abnormality. 2. A 10 mm left globe mass is compatible with the stated history of left orbital melanoma involvement. 3. Chronic ischemic cerebral disease in the right greater than left anterior MCA territories and left PCA territory. Electronically Signed   By: Genevie Ann M.D.   On: 11/10/2015 12:56   Ct Abdomen Pelvis W Contrast  Result Date: 11/06/2015 CLINICAL DATA:  Colonoscopy, cecal mass EXAM: CT ABDOMEN AND PELVIS WITH CONTRAST TECHNIQUE: Multidetector CT imaging of the abdomen and pelvis was performed using the standard protocol following bolus administration of intravenous contrast. CONTRAST:  137mL ISOVUE-300 IOPAMIDOL (ISOVUE-300) INJECTION 61% COMPARISON:  CT chest 07/24/2015 FINDINGS: *Lower chest: There is bilateral small pleural effusion. There is intercostal herniation of the lung parenchyma in right chest wall laterally please see axial image 10. A nodular mass right lower lobe posteriorly has increased in size from prior exam measures 4.8 cm highly suspicious for metastatic disease. Hiatal hernia is noted. There is thickening of distal esophageal wall. Neoplastic process cannot be excluded. Please see axial image 15. Hepatobiliary: Innumerable nodular lesions are noted within liver consistent with metastatic disease. Pancreas: Enhanced pancreas is unremarkable. Spleen: Enhanced spleen is unremarkable. There is perihepatic and perisplenic ascites. There is a diaphragmatic nodule in left anterior diaphragm measures 1.5 cm suspicious for metastatic disease. Adrenals/Urinary Tract: No adrenal gland mass. No hydronephrosis or hydroureter. There is a probable cyst within right kidney anteriorly measures 3 cm. Stomach/Bowel: There is perihepatic and  perisplenic ascites. Perigastric ascites is noted. There is focal thickening of distal gastric wall anteriorly measures 1.9 cm. This is highly suspicious for a gastric mass. Correlation with endoscopy is recommended. No small bowel obstruction. Normal appendix is partially visualized. There is irregular thickening of cecal wall. Cecal mass cannot be excluded. No colonic obstruction. Vascular/Lymphatic: A retroperitoneal lymph node aortocaval region measures 1.4 cm suspicious for metastatic disease. There is nodular omental thickening highly suspicious for omental metastatic disease and omental caking the largest deposit measures 1.9 cm. No aortic aneurysm. Atherosclerotic calcifications of abdominal aorta and iliac arteries are noted. Reproductive: Enlarged prostate gland. The prostate gland measures 6.3 by 5.2 cm. Other: There is a right inguinal scrotal canal hernia containing fat measures 3.2 cm. Small nonspecific bilateral inguinal lymph nodes are noted. Musculoskeletal: Sagittal images of the spine shows degenerative changes thoracolumbar spine. Degenerative changes are noted pubic symphysis. IMPRESSION: 1. There is intercostal herniation of the lung parenchyma in right chest wall laterally measures about 6 cm without evidence of acute complication. Progression in size of nodular mass in right lower lobe pleural based measures 4.8 cm suspicious for metastatic disease. 2. There is thickening of distal esophageal wall. Neoplastic process cannot be excluded. 3. Innumerable nodular lesions are noted throughout the liver suspicious for metastatic disease. 4.  There is focal thickening of distal gastric wall axial image 32 highly suspicious for a gastric mass. 5. There is abdominal and pelvic ascites. Nodular thickening of the omentum highly suspicious for metastatic disease and omental caking. Irregular thickening of cecal wall suspicious for a mass please see axial image 53 6. No distal colonic obstruction. 7.  Enlarged prostate gland with indentation of urinary bladder base. 8. Degenerative changes thoracolumbar spine. Electronically Signed   By: Lahoma Crocker M.D.   On: 11/06/2015 17:20   US Paracentesis  Result Date: 11/14/2015 INDICATION: Metastatic adenocarcinoma. Recurrent abdominal ascites. Request therapeutic paracentesis. EXAM: ULTRASOUND GUIDED RIGHT LOWER QUADRANT PARACENTESIS MEDICATIONS: None. COMPLICATIONS: None immediate. PROCEDURE: Informed written consent was obtained from the patient after a discussion of the risks, benefits and alternatives to treatment. A timeout was performed prior to the initiation of the procedure. Initial ultrasound scanning demonstrates a large amount of ascites within the right lower abdominal quadrant. The right lower abdomen was prepped and draped in the usual sterile fashion. 1% lidocaine with epinephrine was used for local anesthesia. Following this, a 19 gauge, 7-cm, Yueh catheter was introduced. An ultrasound image was saved for documentation purposes. The paracentesis was performed. The catheter was removed and a dressing was applied. The patient tolerated the procedure well without immediate post procedural complication. FINDINGS: A total of approximately 2.5 L of blood-tinged fluid was removed. IMPRESSION: Successful ultrasound-guided paracentesis yielding 2.5 liters of peritoneal fluid. Read by: Ascencion Dike PA-C Electronically Signed   By: Aletta Edouard M.D.   On: 11/14/2015 16:23   US Paracentesis  Result Date: 11/09/2015 INDICATION: Patient with history of PE, remote melanoma, liver/lung lesions, cecal mass, ascites. Request made for diagnostic and therapeutic paracentesis. EXAM: ULTRASOUND GUIDED DIAGNOSTIC AND THERAPEUTIC PARACENTESIS MEDICATIONS: None. COMPLICATIONS: None immediate. PROCEDURE: Informed written consent was obtained from the patient after a discussion of the risks, benefits and alternatives to treatment. A timeout was performed prior to the  initiation of the procedure. Initial ultrasound scanning demonstrates a moderate-to-large amount of ascites within the right lower abdominal quadrant. The right lower abdomen was prepped and draped in the usual sterile fashion. 1% lidocaine was used for local anesthesia. Following this, a Yueh catheter was introduced. An ultrasound image was saved for documentation purposes. The paracentesis was performed. The catheter was removed and a dressing was applied. The patient tolerated the procedure well without immediate post procedural complication. FINDINGS: A total of approximately 4.8 liters of yellow fluid was removed. Samples were sent to the laboratory as requested by the clinical team. IMPRESSION: Successful ultrasound-guided diagnostic and therapeutic paracentesis yielding 4.8 liters of peritoneal fluid. Read by: Rowe Robert, PA-C Electronically Signed   By: Jerilynn Mages.  Shick M.D.   On: 11/09/2015 12:30   Ct Biopsy  Result Date: 11/13/2015 CLINICAL DATA:  Peritoneal carcinomatosis with bulky omental and peritoneal tumor. The patient presents for biopsy. EXAM: CT GUIDED CORE BIOPSY OF PERITONEAL MASS ANESTHESIA/SEDATION: 0.5 mg IV Versed; 25 mcg IV Fentanyl Total Moderate Sedation Time:  10 minutes. The patient's level of consciousness and physiologic status were continuously monitored during the procedure by Radiology nursing. PROCEDURE: The procedure risks, benefits, and alternatives were explained to the patient. Questions regarding the procedure were encouraged and answered. The patient understands and consents to the procedure. A time-out was performed prior to initiating the procedure. The anterior abdominal wall was prepped with chlorhexidine in a sterile fashion, and a sterile drape was applied covering the operative field. A sterile gown and sterile gloves were used  for the procedure. Local anesthesia was provided with 1% Lidocaine. Under CT guidance, a 17 gauge needle was advanced into the anterior  peritoneal cavity near the midline. Three separate coaxial 18 gauge core biopsy samples were obtained and submitted in formalin. COMPLICATIONS: None FINDINGS: Bulky tumor in the anterior peritoneal cavity immediately deep to the abdominal wall was targeted. Solid tissue was obtained. IMPRESSION: CT-guided core biopsy performed of peritoneal tumor. Electronically Signed   By: Aletta Edouard M.D.   On: 11/13/2015 16:10   Dg Chest Port 1 View  Result Date: 11/08/2015 CLINICAL DATA:  Shortness of breath worsening today, history of cancer of the lung, colon and liver, diabetes mellitus, hypertension, melanoma EXAM: PORTABLE CHEST 1 VIEW COMPARISON:  Portable exam 1900 hours compared to 07/24/2015 FINDINGS: Normal heart size, mediastinal contours, and pulmonary vascularity. Atherosclerotic calcification aorta. Again identified peripheral opacity at the lower RIGHT chest question atelectasis versus scarring unchanged. Small focus of gas at the inferior RIGHT chest projects beyond the costal margin, unchanged since prior chest radiograph but not localized on CT. RIGHT infrahilar opacity corresponding to rounded opacity on prior CT again seen. Remaining lungs clear. No pleural effusion or pneumothorax. IMPRESSION: Chronic opacity at the lateral lower RIGHT chest may represent scarring or chronic atelectasis, less likely chronic infiltrate. Rounded RIGHT infrahilar opacity appears unchanged since prior CT, was of fluid attenuation on prior CT question loculated fluid versus necrotic nodule. Aortic atherosclerosis. Remainder of exam unremarkable. Electronically Signed   By: Lavonia Dana M.D.   On: 11/08/2015 19:41      Subjective: no new complaints.   No new complaints.  Discharge Exam: Vitals:   11/15/15 2150 11/16/15 0620  BP: (!) 99/53 124/63  Pulse: 94 92  Resp: 18 18  Temp: 97.8 F (36.6 C) 97.5 F (36.4 C)   Vitals:   11/15/15 0639 11/15/15 1449 11/15/15 2150 11/16/15 0620  BP: (!) 110/50 115/70  (!) 99/53 124/63  Pulse: 80 90 94 92  Resp: 16 19 18 18   Temp: 97.8 F (36.6 C) 98 F (36.7 C) 97.8 F (36.6 C) 97.5 F (36.4 C)  TempSrc: Oral Oral Oral Oral  SpO2: 93% 98% 96% 95%  Weight:      Height:        General: Pt is alert, awake, comfortable.  Cardiovascular: RRR, S1/S2 +, no rubs, no gallops Respiratory: CTA bilaterally, no wheezing, no rhonchi Abdominal: Soft, NT, ND, bowel sounds + Extremities: no edema, no cyanosis    The results of significant diagnostics from this hospitalization (including imaging, microbiology, ancillary and laboratory) are listed below for reference.     Microbiology: Recent Results (from the past 240 hour(s))  MRSA PCR Screening     Status: None   Collection Time: 11/09/15  4:20 AM  Result Value Ref Range Status   MRSA by PCR NEGATIVE NEGATIVE Final    Comment:        The GeneXpert MRSA Assay (FDA approved for NASAL specimens only), is one component of a comprehensive MRSA colonization surveillance program. It is not intended to diagnose MRSA infection nor to guide or monitor treatment for MRSA infections.   Body fluid culture     Status: None   Collection Time: 11/09/15 10:45 AM  Result Value Ref Range Status   Specimen Description FLUID ASCITES  Final   Special Requests NONE  Final   Gram Stain   Final    FEW WBC PRESENT, PREDOMINANTLY MONONUCLEAR NO ORGANISMS SEEN    Culture  Final    NO GROWTH 3 DAYS Performed at [facility]    Report Status 11/12/2015 FINAL  Final     Labs: BNP (last 3 results)  Recent Labs  07/24/15 0741  BNP 0000000*   Basic Metabolic Panel:  Recent Labs Lab 11/12/15 0540 11/13/15 0446 11/14/15 0529 11/15/15 0528 11/16/15 0530  NA 135 133* 135 132* 134*  K 3.9 3.9 3.9 3.9 4.0  CL 102 103 105 102 104  CO2 25 22 24 24 23   GLUCOSE 152* 154* 139* 180* 145*  BUN 11 12 11 12 10   CREATININE 0.80 0.74 0.81 0.76 0.79  CALCIUM 8.6* 8.2* 8.3* 8.3* 8.2*   Liver Function  Tests: No results for input(s): AST, ALT, ALKPHOS, BILITOT, PROT, ALBUMIN in the last 168 hours. No results for input(s): LIPASE, AMYLASE in the last 168 hours. No results for input(s): AMMONIA in the last 168 hours. CBC:  Recent Labs Lab 11/12/15 0540 11/13/15 0446 11/14/15 0529 11/15/15 0528 11/16/15 0530  WBC 7.1 7.6 7.6 7.8 8.8  HGB 9.4* 9.0* 8.9* 8.9* 9.2*  HCT 30.1* 28.6* 28.5* 28.6* 29.4*  MCV 75.1* 75.5* 76.2* 76.5* 76.4*  PLT 395 364 336 314 308   Cardiac Enzymes:  Recent Labs Lab 11/09/15 1315  TROPONINI 0.03*   BNP: Invalid input(s): POCBNP CBG:  Recent Labs Lab 11/15/15 0744 11/15/15 1144 11/15/15 1631 11/15/15 2001 11/16/15 0748  GLUCAP 162* 251* 152* 151* 151*   D-Dimer No results for input(s): DDIMER in the last 72 hours. Hgb A1c No results for input(s): HGBA1C in the last 72 hours. Lipid Profile No results for input(s): CHOL, HDL, LDLCALC, TRIG, CHOLHDL, LDLDIRECT in the last 72 hours. Thyroid function studies No results for input(s): TSH, T4TOTAL, T3FREE, THYROIDAB in the last 72 hours.  Invalid input(s): FREET3 Anemia work up No results for input(s): VITAMINB12, FOLATE, FERRITIN, TIBC, IRON, RETICCTPCT in the last 72 hours. Urinalysis    Component Value Date/Time   COLORURINE YELLOW 07/25/2015 1257   APPEARANCEUR TURBID (A) 07/25/2015 1257   LABSPEC 1.019 07/25/2015 1257   PHURINE 6.5 07/25/2015 1257   GLUCOSEU 500 (A) 07/25/2015 1257   HGBUR NEGATIVE 07/25/2015 1257   BILIRUBINUR NEGATIVE 07/25/2015 1257   KETONESUR NEGATIVE 07/25/2015 1257   PROTEINUR NEGATIVE 07/25/2015 1257   NITRITE NEGATIVE 07/25/2015 1257   LEUKOCYTESUR MODERATE (A) 07/25/2015 1257   Sepsis Labs Invalid input(s): PROCALCITONIN,  WBC,  LACTICIDVEN Microbiology Recent Results (from the past 240 hour(s))  MRSA PCR Screening     Status: None   Collection Time: 11/09/15  4:20 AM  Result Value Ref Range Status   MRSA by PCR NEGATIVE NEGATIVE Final     Comment:        The GeneXpert MRSA Assay (FDA approved for NASAL specimens only), is one component of a comprehensive MRSA colonization surveillance program. It is not intended to diagnose MRSA infection nor to guide or monitor treatment for MRSA infections.   Body fluid culture     Status: None   Collection Time: 11/09/15 10:45 AM  Result Value Ref Range Status   Specimen Description FLUID ASCITES  Final   Special Requests NONE  Final   Gram Stain   Final    FEW WBC PRESENT, PREDOMINANTLY MONONUCLEAR NO ORGANISMS SEEN    Culture   Final    NO GROWTH 3 DAYS Performed at [facility]    Report Status 11/12/2015 FINAL  Final     Time coordinating discharge: Over 30 minutes  SIGNEDHosie Poisson, MD  [facility] 11/16/2015, 9:25 AM Pager [phone]  If 7PM-7AM, please contact night-coverage [email or web address] Password [email or web address]

## 2015-11-15 NOTE — NC FL2 (Deleted)
[place] LEVEL OF CARE SCREENING TOOL     IDENTIFICATION  Patient Name: Martin Leblanc Birthdate: [birth year] Sex: male Admission Date (Current Location): 11/08/2015  [facility] and [place] Number:  [id number] and Address:  [facility],  [place] [address] [place]      Provider Number: [id number]  Attending Physician Name and Address:  Hosie Poisson, MD  Relative Name and Phone Number:       Current Level of Care: Hospital Recommended Level of Care: [place] Prior Approval Number:    Date Approved/Denied:   PASRR Number: [id number]  Discharge Plan: SNF    Current Diagnoses: Patient Active Problem List   Diagnosis Date Noted  . Metastatic cancer ([place])   . Ascites 11/08/2015  . Pulmonary embolus ([place]) 11/08/2015  . Atrial fibrillation with RVR ([place]) 11/08/2015  . Lung mass 11/08/2015  . Mass of cecum 11/08/2015  . Liver metastases ([place]) 11/08/2015  . Acute respiratory failure with hypoxia ([place]) 11/08/2015  . Aortic stenosis 11/08/2015  . PE (pulmonary thromboembolism) ([place]) 11/08/2015  . Abnormal CT scan, chest 09/12/2015  . Symptomatic anemia   . Iron deficiency anemia   . Exertional shortness of breath 07/24/2015  . Dyspnea 07/24/2015    Orientation RESPIRATION BLADDER Height & Weight     Self, Time, Situation, Place  O2 Continent Weight: 222 lb 10.6 oz (101 kg) Height:  5\' 11"  (180.3 cm)  BEHAVIORAL SYMPTOMS/MOOD NEUROLOGICAL BOWEL NUTRITION STATUS  Other (Comment) (no behaviors)   Continent Diet  AMBULATORY STATUS COMMUNICATION OF NEEDS Skin   Limited Assist Verbally Surgical wounds                       Personal Care Assistance Level of Assistance  Bathing, Feeding, Dressing Bathing Assistance: Limited assistance Feeding assistance: Independent Dressing Assistance: Limited assistance     Functional Limitations Info  Sight, Hearing, Speech Sight Info: Impaired Hearing Info:  Adequate Speech Info: Adequate    SPECIAL CARE FACTORS FREQUENCY  PT (By licensed PT), OT (By licensed OT)     PT Frequency: 5x wk OT Frequency: 5x wk            Contractures Contractures Info: Not present    Additional Factors Info  Code Status Code Status Info: Full Code             Current Medications (11/15/2015):  This is the current hospital active medication list Current Facility-Administered Medications  Medication Dose Route Frequency Provider Last Rate Last Dose  . acetaminophen (TYLENOL) tablet 650 mg  650 mg Oral Q6H PRN Lily Kocher, MD   650 mg at 11/15/15 [id number]   Or  . acetaminophen (TYLENOL) suppository 650 mg  650 mg Rectal Q6H PRN Lily Kocher, MD      . antiseptic oral rinse (BIOTENE) solution 15 mL  15 mL Mouth Rinse PRN Lily Kocher, MD      . artificial tears (LACRILUBE) ophthalmic ointment   Both Eyes Daily PRN Lily Kocher, MD      . atorvastatin (LIPITOR) tablet 20 mg  20 mg Oral Daily Lily Kocher, MD   20 mg at 11/14/15 1739  . bisacodyl (DULCOLAX) suppository 10 mg  10 mg Rectal Daily PRN Janece Canterbury, MD   10 mg at 11/14/15 1739  . clotrimazole (LOTRIMIN) 1 % cream   Topical BID Janece Canterbury, MD   1 application at [id number]  . darifenacin (ENABLEX) 24 hr tablet 15  mg  15 mg Oral Daily Lily Kocher, MD   15 mg at 11/15/15 1013  . doxazosin (CARDURA) tablet 4 mg  4 mg Oral Daily Lily Kocher, MD   4 mg at 11/15/15 1014  . enoxaparin (LOVENOX) injection 100 mg  1 mg/kg Subcutaneous BID Polly Cobia, RPH   100 mg at 11/15/15 [id number]  . feeding supplement (ENSURE ENLIVE) (ENSURE ENLIVE) liquid 237 mL  237 mL Oral BID BM Janece Canterbury, MD   237 mL at 11/15/15 1013  . ferrous sulfate tablet 325 mg  325 mg Oral BID WC Janece Canterbury, MD   325 mg at 11/15/15 [id number]  . insulin aspart (novoLOG) injection 0-15 Units  0-15 Units Subcutaneous TID WC Lily Kocher, MD   3 Units at 11/15/15 0825  . ondansetron (ZOFRAN) tablet 4 mg  4 mg Oral Q6H PRN  Lily Kocher, MD   4 mg at 11/13/15 1223   Or  . ondansetron (ZOFRAN) injection 4 mg  4 mg Intravenous Q6H PRN Lily Kocher, MD      . pantoprazole (PROTONIX) EC tablet 40 mg  40 mg Oral Daily Lily Kocher, MD   40 mg at 11/15/15 1014  . polyethylene glycol (MIRALAX / GLYCOLAX) packet 17 g  17 g Oral BID Janece Canterbury, MD   17 g at 11/15/15 1014  . senna (SENOKOT) tablet 17.2 mg  2 tablet Oral QHS Janece Canterbury, MD   17.2 mg at 11/14/15 2112  . sodium chloride flush (NS) 0.9 % injection 3 mL  3 mL Intravenous Q12H Lily Kocher, MD   3 mL at 11/15/15 1014  . tadalafil (CIALIS) tablet 5 mg  5 mg Oral QHS PRN Lily Kocher, MD         Discharge Medications: Please see discharge summary for a list of discharge medications.  Relevant Imaging Results:  Relevant Lab Results:   Additional Information SS # [id number]  Aslan Himes, Randall An, LCSW

## 2015-11-15 NOTE — Progress Notes (Signed)
PROGRESS NOTE  Martin Leblanc  [id number] DOB: 1929-08-11 DOA: 11/08/2015 PCP: Sheela Stack, MD  Brief Narrative:   Martin Leblanc is a 82 y.o. gentleman with a history melanoma affecting his left eye, newly diagnosed metastatic adenocarcinoma likely colon primary, HTN, DM, and GERD who presented to the ED with progressive SOB.  He was found to have submassive PE, probable malignant ascites and lung mets.  He has an acute RLE DVT.  He was also in a-fib with RVR.  He was started on lovenox.  He had a paracentesis with removal of 4.8L of cloudy fluid on 10/19, however, cytology was nondiagnostic.  He subsequently underwent an omental biopsy on 11/13/2015 which confirmed metastatic adenocarcinoma, colon primary.  IV heparin changed to lovenox. Repeat paracentesis done on 10/24.    Assessment & Plan:   Principal Problem:   Pulmonary embolus (HCC) Active Problems:   Dyspnea   Ascites   Atrial fibrillation with RVR (HCC)   Lung mass   Mass of cecum   Liver metastases (HCC)   Acute respiratory failure with hypoxia (HCC)   Aortic stenosis   PE (pulmonary thromboembolism) (HCC)   Metastatic cancer (HCC)  Acute respiratory failure with hypoxia and hemodynamic instability secondary to submassive PE and acute RIGHT lower extremity DVT.  Echo:  Moderate LVH, preserved EF, no evidence of right heart strain.   -  Plan to resume lovenox  with BID dosing -  Will require 3L oxygen at discharge   Paroxysmal a fib, CHADS-Vasc 4, currently NSR --Likely provoked by hypoxia and acute PE --Anticoagulation as above --TSH within normal limits --Echo without regional wall motion abnl - rate better controlled and NSR.   Moderate aortic valve stenosis -  Follow up with cardiology   Metastatic adenocarcinoma, colon primary with omental, liver, lung metastases. -  MRI brain negative for mets -- Appreciate Dr. Ernestina Penna assistance:  -  Palliative care consulted recommended palliative care services to  follow at SNF.   DM type 2, A1c 8.3, CBG well controlled --Low carb diet, SSI CBG (last 3)   Recent Labs  11/15/15 0744 11/15/15 1144 11/15/15 1631  GLUCAP 162* 251* 152*      Iron deficiency anemia due to metastatic cancer.  B12, folate wnl -  Continue iron twice daily  Ascites, likely malignant based on CT scan, but cytology nondiagnostic -  Recurrent paracentesis required.  - pt family wanted to know if peritoneal catheter can be placed for recurrent paracentesis.   Constipation, continue miralax, senna, and bisacodyl, resolving -  At risk for colonic obstruction due to his malignancy.  Important to keep stools soft.  HTN, blood pressure low normal.  D/c ARB GERD, stable, continue PPI BPH, stable, continue cialis Hypokalemia, resolved with oral potassium supplementation  DVT prophylaxis:  Lovenox  Code Status:  Full code Family Communication:  Discussed with patient and the wife and daughters at bedside.  Disposition Plan:  SNF in am.    Consultants:   PC CM  Oncology, Dr. Burr Medico  IR  Procedures:  Lower extremity duplex on 10/19:  Acute right lower extremity DVT Paracentesis on 10/19:  Removal of 4.8 L of yellow fluid Echocardiogram:  Preserved EF, moderate AVS  Antimicrobials:   None    Subjective: Reports no appetite, added marinol.   Objective: Vitals:   11/14/15 1700 11/14/15 2113 11/15/15 0639 11/15/15 1449  BP: (!) 107/59 (!) 90/52 (!) 110/50 115/70  Pulse: 90 91 80 90  Resp: 16 16 16  19  Temp: 98.5 F (36.9 C) 97.6 F (36.4 C) 97.8 F (36.6 C) 98 F (36.7 C)  TempSrc: Oral  Oral Oral  SpO2: 93% 93% 93% 98%  Weight:      Height:        Intake/Output Summary (Last 24 hours) at 11/15/15 1801 Last data filed at 11/15/15 1450  Gross per 24 hour  Intake            819.6 ml  Output              975 ml  Net           -155.4 ml   Filed Weights   11/08/15 1858 11/09/15 0415  Weight: 99.8 kg (220 lb) 101 kg (222 lb 10.6 oz)     Examination:  General exam:  Adult Male. NAD, nasal cannula in place at 3 lit [place].  HEENT:  NCAT, MMM Respiratory system: Diminished at the bilateral bases, otherwise clear to auscultation bilaterally Cardiovascular system: Regular rate and rhythm, normal S1/S2. No murmurs, rubs, gallops or clicks.  Warm extremities Gastrointestinal system: Normal active bowel sounds, palpable mass along bowels to the right of the umbilicus, 20 cm in length, nontender.  Belly somewhat distended today but not tense MSK:  Normal tone and bulk, no lower extremity edema Neuro:  Grossly moves all extremities    Data Reviewed: I have personally reviewed following labs and imaging studies  CBC:  Recent Labs Lab 11/08/15 2000  11/11/15 0559 11/12/15 0540 11/13/15 0446 11/14/15 0529 11/15/15 0528  WBC 10.3  < > 7.6 7.1 7.6 7.6 7.8  NEUTROABS 8.3*  --   --   --   --   --   --   HGB 9.6*  < > 9.0* 9.4* 9.0* 8.9* 8.9*  HCT 30.5*  < > 28.8* 30.1* 28.6* 28.5* 28.6*  MCV 75.3*  < > 73.5* 75.1* 75.5* 76.2* 76.5*  PLT 402*  < > 369 395 364 336 314  < > = values in this interval not displayed. Basic Metabolic Panel:  Recent Labs Lab 11/09/15 0315  11/11/15 0559 11/12/15 0540 11/13/15 0446 11/14/15 0529 11/15/15 0528  NA  --   < > 136 135 133* 135 132*  K  --   < > 3.9 3.9 3.9 3.9 3.9  CL  --   < > 105 102 103 105 102  CO2  --   < > 24 25 22 24 24   GLUCOSE  --   < > 117* 152* 154* 139* 180*  BUN  --   < > 10 11 12 11 12   CREATININE  --   < > 0.78 0.80 0.74 0.81 0.76  CALCIUM  --   < > 8.4* 8.6* 8.2* 8.3* 8.3*  MG 1.7  --   --   --   --   --   --   < > = values in this interval not displayed. GFR: Estimated Creatinine Clearance: 83.2 mL/min (by C-G formula based on SCr of 0.76 mg/dL). Liver Function Tests:  Recent Labs Lab 11/08/15 2000  AST 36  ALT 24  ALKPHOS 104  BILITOT 0.8  PROT 6.9  ALBUMIN 2.9*    Recent Labs Lab 11/08/15 2000  LIPASE 13   No results for input(s):  AMMONIA in the last 168 hours. Coagulation Profile:  Recent Labs Lab 11/08/15 1953 11/13/15 0446  INR 1.18 1.24   Cardiac Enzymes:  Recent Labs Lab 11/09/15 0315 11/09/15 0642 11/09/15 1315  TROPONINI 0.03* 0.03* 0.03*   BNP (last 3 results) No results for input(s): PROBNP in the last 8760 hours. HbA1C: No results for input(s): HGBA1C in the last 72 hours. CBG:  Recent Labs Lab 11/14/15 1647 11/14/15 2222 11/15/15 0744 11/15/15 1144 11/15/15 1631  GLUCAP 165* 237* 162* 251* 152*   Lipid Profile: No results for input(s): CHOL, HDL, LDLCALC, TRIG, CHOLHDL, LDLDIRECT in the last 72 hours. Thyroid Function Tests: No results for input(s): TSH, T4TOTAL, FREET4, T3FREE, THYROIDAB in the last 72 hours. Anemia Panel: No results for input(s): VITAMINB12, FOLATE, FERRITIN, TIBC, IRON, RETICCTPCT in the last 72 hours. Urine analysis:    Component Value Date/Time   COLORURINE YELLOW 07/25/2015 1257   APPEARANCEUR TURBID (A) 07/25/2015 1257   LABSPEC 1.019 07/25/2015 1257   PHURINE 6.5 07/25/2015 1257   GLUCOSEU 500 (A) 07/25/2015 1257   HGBUR NEGATIVE 07/25/2015 1257   BILIRUBINUR NEGATIVE 07/25/2015 1257   KETONESUR NEGATIVE 07/25/2015 1257   PROTEINUR NEGATIVE 07/25/2015 1257   NITRITE NEGATIVE 07/25/2015 1257   LEUKOCYTESUR MODERATE (A) 07/25/2015 1257   Sepsis Labs: @LABRCNTIP (procalcitonin:4,lacticidven:4)  ) Recent Results (from the past 240 hour(s))  MRSA PCR Screening     Status: None   Collection Time: 11/09/15  4:20 AM  Result Value Ref Range Status   MRSA by PCR NEGATIVE NEGATIVE Final    Comment:        The GeneXpert MRSA Assay (FDA approved for NASAL specimens only), is one component of a comprehensive MRSA colonization surveillance program. It is not intended to diagnose MRSA infection nor to guide or monitor treatment for MRSA infections.   Body fluid culture     Status: None   Collection Time: 11/09/15 10:45 AM  Result Value Ref Range  Status   Specimen Description FLUID ASCITES  Final   Special Requests NONE  Final   Gram Stain   Final    FEW WBC PRESENT, PREDOMINANTLY MONONUCLEAR NO ORGANISMS SEEN    Culture   Final    NO GROWTH 3 DAYS Performed at [facility]    Report Status 11/12/2015 FINAL  Final      Radiology Studies: US Paracentesis  Result Date: 11/14/2015 INDICATION: Metastatic adenocarcinoma. Recurrent abdominal ascites. Request therapeutic paracentesis. EXAM: ULTRASOUND GUIDED RIGHT LOWER QUADRANT PARACENTESIS MEDICATIONS: None. COMPLICATIONS: None immediate. PROCEDURE: Informed written consent was obtained from the patient after a discussion of the risks, benefits and alternatives to treatment. A timeout was performed prior to the initiation of the procedure. Initial ultrasound scanning demonstrates a large amount of ascites within the right lower abdominal quadrant. The right lower abdomen was prepped and draped in the usual sterile fashion. 1% lidocaine with epinephrine was used for local anesthesia. Following this, a 19 gauge, 7-cm, Yueh catheter was introduced. An ultrasound image was saved for documentation purposes. The paracentesis was performed. The catheter was removed and a dressing was applied. The patient tolerated the procedure well without immediate post procedural complication. FINDINGS: A total of approximately 2.5 L of blood-tinged fluid was removed. IMPRESSION: Successful ultrasound-guided paracentesis yielding 2.5 liters of peritoneal fluid. Read by: Ascencion Dike PA-C Electronically Signed   By: Aletta Edouard M.D.   On: 11/14/2015 16:23     Scheduled Meds: . atorvastatin  20 mg Oral Daily  . clotrimazole   Topical BID  . darifenacin  15 mg Oral Daily  . doxazosin  4 mg Oral Daily  . dronabinol  2.5 mg Oral BID AC  . enoxaparin (LOVENOX) injection  1  mg/kg Subcutaneous BID  . feeding supplement (ENSURE ENLIVE)  237 mL Oral BID BM  . ferrous sulfate  325 mg Oral BID WC  .  insulin aspart  0-15 Units Subcutaneous TID WC  . pantoprazole  40 mg Oral Daily  . polyethylene glycol  17 g Oral BID  . senna  2 tablet Oral QHS  . sodium chloride flush  3 mL Intravenous Q12H   Continuous Infusions:     LOS: 7 days    Time spent: 30 min    Timtohy Broski, MD [facility] Pager [phone]  If 7PM-7AM, please contact night-coverage [email or web address] Password [email or web address] 11/15/2015, 6:01 PM

## 2015-11-15 NOTE — Progress Notes (Signed)
Physical Therapy Treatment Patient Details Name: Martin Leblanc MRN: [id number] DOB: May 09, 1929 Today's Date: 12-03-15    History of Present Illness 82 y.o. male admitted with dyspnea, Dx of pulmonary embolism, RLE DVT. PMH of widely metastatic cancer (colon, lung, liver, ? primary), COPD, DM, GIB.    PT Comments    Pt ambulated in hallway and was assisted back to chair. Pt reported feeling better after paracentesis, no report of pain today. Pt able to ambulate increased distance, no LOB episodes or unsteadiness.  Follow Up Recommendations  SNF;Supervision for mobility/OOB (per family request)     Equipment Recommendations  Rolling walker with 5" wheels    Recommendations for Other Services       Precautions / Restrictions Precautions Precautions: Fall Restrictions Weight Bearing Restrictions: No    Mobility  Bed Mobility Overal bed mobility: Needs Assistance Bed Mobility: Supine to Sit     Supine to sit: Min assist     General bed mobility comments: assistance for trunk, pt able to raise trunk with hand held assist  Transfers Overall transfer level: Needs assistance Equipment used: Rolling walker (2 wheeled) Transfers: Sit to/from Stand Sit to Stand: Min assist         General transfer comment: assistance for controlled rise and descent; required verbal cues for controlled lower to chair and for UE positioning on RW and arm rest of chairs during transfer  Ambulation/Gait Ambulation/Gait assistance: Min guard Ambulation Distance (Feet): 220 Feet Assistive device: Rolling walker (2 wheeled) Gait Pattern/deviations: Step-through pattern;Decreased stride length Gait velocity: decreased   General Gait Details: guarding for safety; verbal cues required for RW positioning; SpO2 monitored during gait on 3L O2, maintained 90-94% with HR fluctuating between 90-115. Pt reported no SOB.   Stairs            Wheelchair Mobility    Modified Rankin (Stroke  Patients Only)       Balance                                    Cognition Arousal/Alertness: Awake/alert Behavior During Therapy: WFL for tasks assessed/performed Overall Cognitive Status: Within Functional Limits for tasks assessed                      Exercises      General Comments        Pertinent Vitals/Pain Pain Assessment: No/denies pain    Home Living                      Prior Function            PT Goals (current goals can now be found in the care plan section) Progress towards PT goals: Progressing toward goals    Frequency    Min 3X/week      PT Plan Current plan remains appropriate    Co-evaluation             End of Session Equipment Utilized During Treatment: Gait belt;Oxygen Activity Tolerance: Patient tolerated treatment well Patient left: in chair;with call bell/phone within reach;with chair alarm set;with family/visitor present     Time: [id number] PT Time Calculation (min) (ACUTE ONLY): 15 min  Charges:  $Gait Training: 8-22 mins                    G Codes:      Dewitt Hoes December 03, 2015,  3:46 PM  Dewitt Hoes, SPT

## 2015-11-15 NOTE — Clinical Social Work Placement (Signed)
   CLINICAL SOCIAL WORK PLACEMENT  NOTE  Date:  11/15/2015  Patient Details  Name: Martin Leblanc MRN: [id number] Date of Birth: 1929-10-14  Clinical Social Work is seeking post-discharge placement for this patient at the [place] level of care (*CSW will initial, date and re-position this form in  chart as items are completed):  Yes   Patient/family provided with [place] Work Department's list of facilities offering this level of care within the geographic area requested by the patient (or if unable, by the patient's family).  Yes   Patient/family informed of their freedom to choose among providers that offer the needed level of care, that participate in Medicare, Medicaid or managed care program needed by the patient, have an available bed and are willing to accept the patient.  Yes   Patient/family informed of [facility]'s ownership interest in [facility] and [facility], as well as of the fact that they are under no obligation to receive care at these facilities.  PASRR submitted to EDS on 11/15/15     PASRR number received on 11/15/15     Existing PASRR number confirmed on       FL2 transmitted to all facilities in geographic area requested by pt/family on 11/15/15     FL2 transmitted to all facilities within larger geographic area on       Patient informed that his/her managed care company has contracts with or will negotiate with certain facilities, including the following:            Patient/family informed of bed offers received.  Patient chooses bed at       Physician recommends and patient chooses bed at      Patient to be transferred to   on  .  Patient to be transferred to facility by       Patient family notified on   of transfer.  Name of family member notified:        PHYSICIAN       Additional Comment:    _______________________________________________ Loraine Maple  [phone] 11/15/2015,  12:37 PM

## 2015-11-15 NOTE — NC FL2 (Signed)
[place] LEVEL OF CARE SCREENING TOOL     IDENTIFICATION  Patient Name: Martin Leblanc Birthdate: 11/13/1929 Sex: male Admission Date (Current Location): 11/08/2015  [facility] and [place] Number:  [employer] and Address:  [facility],  [place] [address], [place]      Provider Number: [id number]  Attending Physician Name and Address:  Hosie Poisson, MD  Relative Name and Phone Number:       Current Level of Care: Hospital Recommended Level of Care: [place] Prior Approval Number:    Date Approved/Denied:   PASRR Number: [id number]  Discharge Plan: SNF    Current Diagnoses: Patient Active Problem List   Diagnosis Date Noted  . Metastatic cancer ([place])   . Ascites 11/08/2015  . Pulmonary embolus ([place]) 11/08/2015  . Atrial fibrillation with RVR ([place]) 11/08/2015  . Lung mass 11/08/2015  . Mass of cecum 11/08/2015  . Liver metastases ([place]) 11/08/2015  . Acute respiratory failure with hypoxia ([place]) 11/08/2015  . Aortic stenosis 11/08/2015  . PE (pulmonary thromboembolism) ([place]) 11/08/2015  . Abnormal CT scan, chest 09/12/2015  . Symptomatic anemia   . Iron deficiency anemia   . Exertional shortness of breath 07/24/2015  . Dyspnea 07/24/2015    Orientation RESPIRATION BLADDER Height & Weight     Self, Time, Situation, Place  O2 Continent Weight: 222 lb 10.6 oz (101 kg) Height:  5\' 11"  (180.3 cm)  BEHAVIORAL SYMPTOMS/MOOD NEUROLOGICAL BOWEL NUTRITION STATUS  Other (Comment) (no behaviors)   Continent Diet  AMBULATORY STATUS COMMUNICATION OF NEEDS Skin   Limited Assist Verbally Surgical wounds                       Personal Care Assistance Level of Assistance  Bathing, Feeding, Dressing Bathing Assistance: Limited assistance Feeding assistance: Independent Dressing Assistance: Limited assistance     Functional Limitations Info  Sight, Hearing, Speech Sight Info: Impaired Hearing Info:  Adequate Speech Info: Adequate    SPECIAL CARE FACTORS FREQUENCY  PT (By licensed PT), OT (By licensed OT)     PT Frequency: 5x wk OT Frequency: 5x wk            Contractures Contractures Info: Not present    Additional Factors Info  Code Status Code Status Info: Full Code             Current Medications (11/15/2015):  This is the current hospital active medication list Current Facility-Administered Medications  Medication Dose Route Frequency Provider Last Rate Last Dose  . acetaminophen (TYLENOL) tablet 650 mg  650 mg Oral Q6H PRN Lily Kocher, MD   650 mg at 11/15/15 [id number]   Or  . acetaminophen (TYLENOL) suppository 650 mg  650 mg Rectal Q6H PRN Lily Kocher, MD      . antiseptic oral rinse (BIOTENE) solution 15 mL  15 mL Mouth Rinse PRN Lily Kocher, MD      . artificial tears (LACRILUBE) ophthalmic ointment   Both Eyes Daily PRN Lily Kocher, MD      . atorvastatin (LIPITOR) tablet 20 mg  20 mg Oral Daily Lily Kocher, MD   20 mg at 11/14/15 1739  . bisacodyl (DULCOLAX) suppository 10 mg  10 mg Rectal Daily PRN Janece Canterbury, MD   10 mg at 11/14/15 1739  . clotrimazole (LOTRIMIN) 1 % cream   Topical BID Janece Canterbury, MD   1 application at [id number]  . darifenacin (ENABLEX) 24 hr tablet 15  mg  15 mg Oral Daily Lily Kocher, MD   15 mg at 11/14/15 1041  . doxazosin (CARDURA) tablet 4 mg  4 mg Oral Daily Lily Kocher, MD   4 mg at 11/14/15 1042  . enoxaparin (LOVENOX) injection 100 mg  1 mg/kg Subcutaneous BID Polly Cobia, RPH   100 mg at 11/15/15 [id number]  . feeding supplement (ENSURE ENLIVE) (ENSURE ENLIVE) liquid 237 mL  237 mL Oral BID BM Janece Canterbury, MD   237 mL at 11/14/15 1035  . ferrous sulfate tablet 325 mg  325 mg Oral BID WC Janece Canterbury, MD   325 mg at 11/15/15 [id number]  . insulin aspart (novoLOG) injection 0-15 Units  0-15 Units Subcutaneous TID WC Lily Kocher, MD   3 Units at 11/15/15 0825  . ondansetron (ZOFRAN) tablet 4 mg  4 mg Oral Q6H PRN  Lily Kocher, MD   4 mg at 11/13/15 1223   Or  . ondansetron (ZOFRAN) injection 4 mg  4 mg Intravenous Q6H PRN Lily Kocher, MD      . pantoprazole (PROTONIX) EC tablet 40 mg  40 mg Oral Daily Lily Kocher, MD   40 mg at 11/14/15 1043  . polyethylene glycol (MIRALAX / GLYCOLAX) packet 17 g  17 g Oral BID Janece Canterbury, MD   17 g at 11/14/15 2112  . senna (SENOKOT) tablet 17.2 mg  2 tablet Oral QHS Janece Canterbury, MD   17.2 mg at 11/14/15 2112  . sodium chloride flush (NS) 0.9 % injection 3 mL  3 mL Intravenous Q12H Lily Kocher, MD   3 mL at 11/14/15 2112  . tadalafil (CIALIS) tablet 5 mg  5 mg Oral QHS PRN Lily Kocher, MD         Discharge Medications: Please see discharge summary for a list of discharge medications.  Relevant Imaging Results:  Relevant Lab Results:   Additional Information SS # [id number]  Bonita Brindisi, Randall An, LCSW

## 2015-11-15 NOTE — Clinical Social Work Note (Signed)
Clinical Social Work Assessment  Patient Details  Name: Martin Leblanc MRN: [id number] Date of Birth: 25-Aug-1929  Date of referral:  11/15/15               Reason for consult:  Discharge Planning                Permission sought to share information with:  Chartered certified accountant granted to share information::  Yes, Verbal Permission Granted  Name::        Agency::     Relationship::     Contact Information:     Housing/Transportation Living arrangements for the past 2 months:  Single Family Home Source of Information:  Adult Children Patient Interpreter Needed:  None Criminal Activity/Legal Involvement Pertinent to Current Situation/Hospitalization:  No - Comment as needed Significant Relationships:  Adult Children Lives with:  Self Do you feel safe going back to the place where you live?  No Need for family participation in patient care:  Yes (Comment)  Care giving concerns: Pt's care cannot be managed at home following hospital d/c.   Social Worker assessment / plan:  Pt hospitalized from home on 11/08/15 with Pulmonary embolus. CSW met with pt / family to assist with d/c planning. PT has recommended ST SNF placement at d/c. Palliative Care Team is following and has recommended palliative services at SNF. Recommendations reviewed with pt / family and they are in agreement with SNF placement . SNF search has been initiated and bed offers are pending. CSW will continue to follow to assist with d/c planning needs.  Employment status:  Retired Nurse, adult PT Recommendations:  [place] / Referral to community resources:  [place]  Patient/Family's Response to care:  Pt / family feel rehab is needed.  Patient/Family's Understanding of and Emotional Response to Diagnosis, Current Treatment, and Prognosis:  Pt / family are aware of pt's medical status. Family is very supportive offering pt much  encouragement. Daughter reports, " I want my dad to go to rehab and get strong enough to get back home safely. " Support / reassurance provided.  Emotional Assessment Appearance:  Appears stated age Attitude/Demeanor/Rapport:  Other (cooperative) Affect (typically observed):  Appropriate, Calm Orientation:  Oriented to Self, Oriented to Place, Oriented to  Time, Oriented to Situation Alcohol / Substance use:  Not Applicable Psych involvement (Current and /or in the community):  No (Comment)  Discharge Needs  Concerns to be addressed:  Discharge Planning Concerns Readmission within the last 30 days:  No Current discharge risk:  None Barriers to Discharge:  No Barriers Identified   Loraine Maple  [phone] 11/15/2015, 12:26 PM

## 2015-11-16 ENCOUNTER — Telehealth: Payer: Self-pay

## 2015-11-16 ENCOUNTER — Telehealth: Payer: Self-pay | Admitting: *Deleted

## 2015-11-16 LAB — CBC
HCT: 29.4 % — ABNORMAL LOW (ref 39.0–52.0)
HEMOGLOBIN: 9.2 g/dL — AB (ref 13.0–17.0)
MCH: 23.9 pg — AB (ref 26.0–34.0)
MCHC: 31.3 g/dL (ref 30.0–36.0)
MCV: 76.4 fL — AB (ref 78.0–100.0)
Platelets: 308 10*3/uL (ref 150–400)
RBC: 3.85 MIL/uL — AB (ref 4.22–5.81)
RDW: 17.5 % — ABNORMAL HIGH (ref 11.5–15.5)
WBC: 8.8 10*3/uL (ref 4.0–10.5)

## 2015-11-16 LAB — BASIC METABOLIC PANEL
ANION GAP: 7 (ref 5–15)
BUN: 10 mg/dL (ref 6–20)
CHLORIDE: 104 mmol/L (ref 101–111)
CO2: 23 mmol/L (ref 22–32)
Calcium: 8.2 mg/dL — ABNORMAL LOW (ref 8.9–10.3)
Creatinine, Ser: 0.79 mg/dL (ref 0.61–1.24)
GFR calc non Af Amer: >60 mL/min (ref >60)
Glucose, Bld: 145 mg/dL — ABNORMAL HIGH (ref 65–99)
Potassium: 4 mmol/L (ref 3.5–5.1)
Sodium: 134 mmol/L — ABNORMAL LOW (ref 135–145)

## 2015-11-16 LAB — GLUCOSE, CAPILLARY: GLUCOSE-CAPILLARY: 151 mg/dL — AB (ref 65–99)

## 2015-11-16 MED ORDER — CLOTRIMAZOLE 1 % EX CREA: TOPICAL_CREAM | Freq: Two times a day (BID) | CUTANEOUS | 0 refills | Status: AC

## 2015-11-16 MED ORDER — INSULIN ASPART 100 UNIT/ML ~~LOC~~ SOLN: 0.0000 [IU] | Freq: Three times a day (TID) | SUBCUTANEOUS | 11 refills | Status: AC

## 2015-11-16 MED ORDER — ENOXAPARIN SODIUM 150 MG/ML ~~LOC~~ SOLN: 1.0000 mg/kg | Freq: Two times a day (BID) | SUBCUTANEOUS | Status: DC

## 2015-11-16 MED ORDER — POLYETHYLENE GLYCOL 3350 17 G PO PACK: 17.0000 g | PACK | Freq: Two times a day (BID) | ORAL | 0 refills | Status: AC

## 2015-11-16 MED ORDER — DRONABINOL 2.5 MG PO CAPS: 2.5000 mg | ORAL_CAPSULE | Freq: Two times a day (BID) | ORAL | Status: DC

## 2015-11-16 NOTE — Clinical Social Work Placement (Signed)
Patient is set to discharge to [facility] today. Patient & daughters - Tomi Bamberger & Pattie made aware. Discharge packet given to RN, Joellen Jersey. PTAR called for transport.     Raynaldo Opitz, [facility] Clinical Social Worker cell #: [phone]    CLINICAL SOCIAL WORK PLACEMENT  NOTE  Date:  11/16/2015  Patient Details  Name: Martin Leblanc MRN: [id number] Date of Birth: 05-04-1929  Clinical Social Work is seeking post-discharge placement for this patient at the [place] level of care (*CSW will initial, date and re-position this form in  chart as items are completed):  Yes   Patient/family provided with [place] Work Department's list of facilities offering this level of care within the geographic area requested by the patient (or if unable, by the patient's family).  Yes   Patient/family informed of their freedom to choose among providers that offer the needed level of care, that participate in Medicare, Medicaid or managed care program needed by the patient, have an available bed and are willing to accept the patient.  Yes   Patient/family informed of [facility] ownership interest in [facility] and [facility], as well as of the fact that they are under no obligation to receive care at these facilities.  PASRR submitted to EDS on 11/15/15     PASRR number received on 11/15/15     Existing PASRR number confirmed on       FL2 transmitted to all facilities in geographic area requested by pt/family on 11/15/15     FL2 transmitted to all facilities within larger geographic area on       Patient informed that his/her managed care company has contracts with or will negotiate with certain facilities, including the following:        Yes   Patient/family informed of bed offers received.  Patient chooses bed at [place], [place]     Physician recommends and patient chooses bed at      Patient to  be transferred to [facility] on 11/16/15.  Patient to be transferred to facility by PTAR     Patient family notified on 11/16/15 of transfer.  Name of family member notified:  patient's daughter, Tomi Bamberger & Pattie      PHYSICIAN       Additional Comment:    _______________________________________________ Standley Brooking, LCSW 11/16/2015, 11:37 AM

## 2015-11-16 NOTE — Progress Notes (Signed)
Nutrition Follow-up  DOCUMENTATION CODES:   Obesity unspecified  INTERVENTION:  - None for d/c'ed pt.  NUTRITION DIAGNOSIS:   Increased nutrient needs related to catabolic illness, cancer and cancer related treatments as evidenced by estimated needs. -ongoing  GOAL:   Patient will meet greater than or equal to 90% of their needs -meeting the past 2 days.  MONITOR:   PO intake, Supplement acceptance, Weight trends, Labs, I & O's  ASSESSMENT:   82 y.o. 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  He was brought in by EMS for progressive SOB. In July he had admission to [facility] for chest pain and SOB.  He was found to have iron deficiency anemia, and his symptoms were attributed to this.  A CTA of the chest at that time was negative for PE but showed a pulmonary nodule, and follow-up imaging was recommended. Echo in July also showed grade II diastolic dysfunction, and mod to severe AS with a valve area of 0.95cm2.  He ultimately followed up in clinic with both cardiology and pulmonary, but no alternative causes for his symptoms were identified.  He saw his urologist in late August for LUTS and a bladder mass was identified; however, biopsies from this lesion were reportedly benign.  He continued to have shortness of breath, and decreased appetite (though he denies significant weight loss).  He requested colonoscopy by his GI provider, which revealed a mass at the cecum.  He was referred to Dr. Ninfa Linden for possible hemicolectomy, but repeat imaging of his abdomen and pelvis on 10/16 showed progression in size of the RLL lung mass concerning for metastatic disease, innumerable liver lesions concerning for mets, focal thickening of the gastric wall, irregular thickening of the cecal wall, and ascites. He was no longer considered a surgical candidate and was referred to oncology. In the ED he was found to have mild hypoxia with new O2 requirement. He was also found to be  in atrial fibrillation with RVR.  10/26 No new weight since 10/19. Per chart review, pt consumed 100% of breakfast and lunch yesterday and 75% of breakfast this AM. Pt to d/c to SNF today with d/c order and summary currently in place.  Medications reviewed; PRN Dulcolax, 2.5 mg Marinol BID, 325 mg oral ferrous sulfate BID, sliding scale Novolog, PRN Zofran, 40 mg oral Protonix/day, 17 g Miralax BID, 2 tablets Senokot/day. Labs reviewed; CBG: 151 mg/dL today, Na: 134 mmol/L, Ca: 8.2 mg/dL.   10/23 - Per chart review, pt consumed 25% of breakfast and 0% of lunch on 10/21; 50% of breakfast and 25% of dinner yesterday.  - Pt was NPO this AM for biopsy and was eating lunch at the time of RD visit.  - Pt had consumed 50% of mashed potatoes, a few bites of pot roast, 75% of pinto beans, 100% of coleslaw, and a few sips of iced tea; strawberry yogurt untouched. - Pt denies chewing or swallowing issues and denies any experience of taste alteration or lack of taste.  - He reports poor appetite now which began a few weeks PTA.  - Pt states that he feels full quickly.  - He denies abdominal pain/pressure or nausea with PO intakes.  - Unopened Ensure on bedside table. Pt states he has drank a few bottles over the past 3-4 days and is able to tolerate them although he does not particularly care for them.  - Encouraged pt to drink Ensure very cold or over ice.  -  Physical assessment shows no muscle or fat wasting at this time. No new weight since 10/19.   10/20 - No intakes documented since admission.  - Unable to see pt x3 attempted visits today and pt's daughter currently having in depth discussion with PT.  - Pt working with PT at time of second attempted visit and walked in the hallway.  - Notes indicates pt with poor appetite PTA.  - Will order Ensure Enlive to supplement and will follow-up on Monday for full assessment and adjust interventions as warranted at that time.  - Unable to complete  physical assessment at this time and will complete at time of follow-up.  - Per chart review, pt has lost 7 lbs (3% body weight) in the past 1.5-2 months which is not significant for time frame.     Diet Order:  Diet Carb Modified Fluid consistency: Thin; Room service appropriate? Yes with Assist  Skin:  Reviewed, no issues  Last BM:  10/26  Height:   Ht Readings from Last 1 Encounters:  11/09/15 5\' 11"  (1.803 m)    Weight:   Wt Readings from Last 1 Encounters:  11/09/15 222 lb 10.6 oz (101 kg)    Ideal Body Weight:  78.18 kg  BMI:  Body mass index is 31.06 kg/m.  Estimated Nutritional Needs:   Kcal:  2020-2220 (20-22 kcal/kg)  Protein:  90-100 grams  Fluid:  >/= 2 L/day  EDUCATION NEEDS:   No education needs identified at this time    Jarome Matin, MS, RD, LDN Inpatient Clinical Dietitian Pager # [phone] After hours/weekend pager # [phone]

## 2015-11-16 NOTE — Progress Notes (Signed)
Physical Therapy Treatment Patient Details Name: Martin Leblanc MRN: [id number] DOB: 12/12/1929 Today's Date: 11/16/2015    History of Present Illness 82 y.o. male admitted with dyspnea, Dx of pulmonary embolism, RLE DVT. PMH of widely metastatic cancer (colon, lung, liver, ? primary), COPD, DM, GIB.    PT Comments    Pt continues to participate well with therapy. Plan is for d/c to SNF later today for continued rehab.   Follow Up Recommendations  SNF     Equipment Recommendations  Rolling walker with 5" wheels    Recommendations for Other Services       Precautions / Restrictions Precautions Precautions: Fall Precaution Comments: monitor O2 Restrictions Weight Bearing Restrictions: No    Mobility  Bed Mobility Overal bed mobility: Needs Assistance Bed Mobility: Supine to Sit     Supine to sit: Min assist     General bed mobility comments: assistance for trunk, pt able to raise trunk with hand held assist  Transfers Overall transfer level: Needs assistance Equipment used: Rolling walker (2 wheeled) Transfers: Sit to/from Stand Sit to Stand: Min assist         General transfer comment: assistance for controlled rise and descent; required verbal cues for controlled lower to chair, hand placement  Ambulation/Gait Ambulation/Gait assistance: Min guard Ambulation Distance (Feet): 200 Feet Assistive device: Rolling walker (2 wheeled) Gait Pattern/deviations: Step-through pattern;Trunk flexed     General Gait Details: cues for RW safety. Remained on 3L [place] O2 during ambulation. Mostly close guard for safety. Min assist intermittently for safety when turning   Stairs            Wheelchair Mobility    Modified Rankin (Stroke Patients Only)       Balance Overall balance assessment: Needs assistance           Standing balance-Leahy Scale: Poor Standing balance comment: requires RW                    Cognition Arousal/Alertness:  Awake/alert Behavior During Therapy: WFL for tasks assessed/performed Overall Cognitive Status: Within Functional Limits for tasks assessed                      Exercises      General Comments        Pertinent Vitals/Pain Pain Assessment: Faces Faces Pain Scale: Hurts a little bit Pain Location: abdomen Pain Descriptors / Indicators: Sore Pain Intervention(s): Monitored during session;Repositioned    Home Living                      Prior Function            PT Goals (current goals can now be found in the care plan section) Progress towards PT goals: Progressing toward goals    Frequency    Min 3X/week      PT Plan Current plan remains appropriate    Co-evaluation             End of Session Equipment Utilized During Treatment: Gait belt;Oxygen Activity Tolerance: Patient tolerated treatment well Patient left: in chair;with call bell/phone within reach;with chair alarm set     Time: 1010-1023 PT Time Calculation (min) (ACUTE ONLY): 13 min  Charges:  $Gait Training: 8-22 mins                    G Codes:      Weston Anna, MPT Pager: [phone]

## 2015-11-16 NOTE — Progress Notes (Signed)
Report called to RN at [facility].  All questions answered.  Pt transported via ptar.  AVS given to patient daughter.

## 2015-11-16 NOTE — Telephone Encounter (Signed)
Faxed FMLA forms to [employer]- patients daughter d/t need of signature. Precious Bard states she will fax to [employer].

## 2015-11-16 NOTE — Telephone Encounter (Signed)
Oncology Nurse Navigator Documentation  Oncology Nurse Navigator Flowsheets 11/16/2015  Navigator Location [facility]  Referral date to RadOnc/MedOnc -  Navigator Encounter Type Telephone  Telephone Incoming Call;Outgoing Call;FMLA/Disability;Appt Confirmation/Clarification  Abnormal Finding Date -  Confirmed Diagnosis Date -  Barriers/Navigation Needs -  Interventions Other--spoke with nurse, Tiffany who is working on forms. Daughter, Pattie requests that forms be faxed to her at work att: Research officer, political party and she will sign them and send to company when complete.  Coordination of Care -Note to MD to inquire when he needs follow up.  Acuity -  Time Spent with Patient 15  Patient was admitted to [facility] in [place] today.

## 2015-11-16 NOTE — Care Management Note (Signed)
Case Management Note  Patient Details  Name: Martin Leblanc MRN: [id number] Date of Birth: 1930/01/02  Subjective/Objective:                    Action/Plan:d/c SNF w/PCS.   Expected Discharge Date:   (unknown)               Expected Discharge Plan:  [place]  In-House Referral:     Discharge planning Services  CM Consult  Post Acute Care Choice:    Choice offered to:     DME Arranged:    DME Agency:     HH Arranged:    [place] Agency:     Status of Service:  Completed, signed off  If discussed at [employer] of Stay Meetings, dates discussed:    Additional Comments:  Dessa Phi, RN 11/16/2015, 9:53 AM

## 2015-11-20 ENCOUNTER — Telehealth: Payer: Self-pay | Admitting: Hematology

## 2015-11-20 NOTE — Telephone Encounter (Signed)
I left a voice message to pt's daughter Ilona Sorrel, and asked her to call back, regarding his CT scan, paracentesis and follow up appointments.   Truitt Merle  11/20/2015

## 2015-11-21 ENCOUNTER — Telehealth: Payer: Self-pay | Admitting: Emergency Medicine

## 2015-11-21 ENCOUNTER — Telehealth: Payer: Self-pay | Admitting: *Deleted

## 2015-11-21 ENCOUNTER — Other Ambulatory Visit: Payer: Self-pay | Admitting: Hematology

## 2015-11-21 ENCOUNTER — Inpatient Hospital Stay: Admit: 2015-11-21 | Payer: Medicare Other | Admitting: Surgery

## 2015-11-21 DIAGNOSIS — C799 Secondary malignant neoplasm of unspecified site: Secondary | ICD-10-CM

## 2015-11-21 SURGERY — LAPAROSCOPIC PARTIAL COLECTOMY
Anesthesia: General

## 2015-11-21 NOTE — Telephone Encounter (Signed)
RB susan called to get permission to cancel the CT scan that is scheduled.  Pt was dx with colon cancer and will be admitted to hospice.  Please advise. thanks

## 2015-11-21 NOTE — Telephone Encounter (Signed)
Dr. Burr Medico needs patient to be seen at 12:30 tomorrow instead of after paracentesis. Left VM on daughters moblie # with request to return call to confirm.

## 2015-11-21 NOTE — Telephone Encounter (Signed)
Oncology Nurse Navigator Documentation  Oncology Nurse Navigator Flowsheets 11/21/2015  Navigator Location [facility]  Referral date to RadOnc/MedOnc -  Navigator Encounter Type Telephone  Telephone Outgoing Call;Appt Confirmation/Clarification--provided daughter appointment for paracentesis and OV on 11/22/15 with Dr. Burr Medico. Made Dr. Burr Medico aware of contact information to speak with daughter regarding diagnosis, prognosis and plan of care.  Abnormal Finding Date -  Confirmed Diagnosis Date -  Barriers/Navigation Needs Coordination of Care  Interventions Coordination of Care--call to Dr. Agustina Caroli office to request cancellation of CT chest tomorrow. Not needed per Dr. Burr Medico.  Coordination of Care Appts  Acuity -  Time Spent with Patient 30

## 2015-11-22 ENCOUNTER — Ambulatory Visit (HOSPITAL_COMMUNITY)
Admission: RE | Admit: 2015-11-22 | Discharge: 2015-11-22 | Disposition: A | Discharge status: Home / Self Care | Payer: Medicare Other | Source: Ambulatory Visit | Attending: Hematology | Admitting: Hematology

## 2015-11-22 ENCOUNTER — Inpatient Hospital Stay: Admission: RE | Admit: 2015-11-22 | Payer: Medicare Other | Source: Ambulatory Visit

## 2015-11-22 ENCOUNTER — Ambulatory Visit (HOSPITAL_BASED_OUTPATIENT_CLINIC_OR_DEPARTMENT_OTHER): Payer: Medicare Other | Admitting: Hematology

## 2015-11-22 ENCOUNTER — Encounter: Payer: Self-pay | Admitting: *Deleted

## 2015-11-22 ENCOUNTER — Encounter: Payer: Self-pay | Admitting: Hematology

## 2015-11-22 VITALS — BP 106/45 | HR 77 | Temp 97.5°F | Resp 18 | Ht 71.0 in | Wt 216.9 lb

## 2015-11-22 DIAGNOSIS — C799 Secondary malignant neoplasm of unspecified site: Secondary | ICD-10-CM | POA: Diagnosis not present

## 2015-11-22 DIAGNOSIS — C786 Secondary malignant neoplasm of retroperitoneum and peritoneum: Secondary | ICD-10-CM

## 2015-11-22 DIAGNOSIS — C787 Secondary malignant neoplasm of liver and intrahepatic bile duct: Secondary | ICD-10-CM | POA: Diagnosis not present

## 2015-11-22 DIAGNOSIS — C189 Malignant neoplasm of colon, unspecified: Secondary | ICD-10-CM

## 2015-11-22 DIAGNOSIS — Z7901 Long term (current) use of anticoagulants: Secondary | ICD-10-CM

## 2015-11-22 DIAGNOSIS — D649 Anemia, unspecified: Secondary | ICD-10-CM

## 2015-11-22 DIAGNOSIS — C7801 Secondary malignant neoplasm of right lung: Secondary | ICD-10-CM | POA: Diagnosis not present

## 2015-11-22 DIAGNOSIS — Z86711 Personal history of pulmonary embolism: Secondary | ICD-10-CM

## 2015-11-22 DIAGNOSIS — R188 Other ascites: Secondary | ICD-10-CM

## 2015-11-22 DIAGNOSIS — D509 Iron deficiency anemia, unspecified: Secondary | ICD-10-CM

## 2015-11-22 DIAGNOSIS — I2699 Other pulmonary embolism without acute cor pulmonale: Secondary | ICD-10-CM

## 2015-11-22 DIAGNOSIS — C180 Malignant neoplasm of cecum: Secondary | ICD-10-CM

## 2015-11-22 NOTE — Progress Notes (Signed)
Oncology Nurse Navigator Documentation  Oncology Nurse Navigator Flowsheets 11/22/2015  Navigator Location [facility]  Referral date to RadOnc/MedOnc -  Navigator Encounter Type Follow-up Appt--Hospital follow-up  Telephone -  Abnormal Finding Date -  Confirmed Diagnosis Date -  Patient Visit Type MedOnc  Treatment Phase Pre-Tx/Tx Discussion  Barriers/Navigation Needs Coordination of Care;Family concerns  Interventions Coordination of Care  Coordination of Care Radiology--scheduled his next paracentesis as well as return to Dr. Burr Medico and printed calendar for daughter, Pattie  Acuity -  Time Spent with Patient 15  Briefly introduced myself to patient and his two daughters. He is still in quite a bit of denial of his cancer being advanced stage and not curable. He is currently at [facility] in [place]. Will follow up at next visit on 12/21/15.

## 2015-11-22 NOTE — Procedures (Signed)
Ultrasound-guided therapeutic paracentesis performed yielding 2.8 liters of amber colored fluid. No immediate complications.  Martin Leblanc E 2:34 PM 11/22/2015

## 2015-11-22 NOTE — Progress Notes (Signed)
[name]  Telephone:([phone]) [phone] Fax:(336) [phone]  Clinic Follow up Note   Patient Care Team: Reynold Bowen, MD as PCP - General (Endocrinology)  11/22/2015  SUMMARY OF ONCOLOGIC HISTORY: Oncology History   Metastatic colon cancer to liver [facility])   Staging form: Colon and Rectum, AJCC 7th Edition   - Clinical stage from 10/30/2015: Stage IVB ([place], NX, M1b) - Signed by Truitt Merle, MD on 11/22/2015      Metastatic colon cancer to liver ([place])   10/30/2015 Initial Diagnosis    Metastatic cancer ([place])      10/30/2015 Initial Biopsy    Cecal mass biopsy on the colonoscopy showed adenocarcinoma      11/06/2015 Imaging    CT abdomen and pelvis showed innumerable nodular lesions in the liver, highly suspicious for metastasis. Nodular thickening of the omentum highly suspicious for metastasis, moderate ascites, enlarged prostate gland.      11/08/2015 Imaging    CT [facility] chest showed pulmonary embolism in both main pulmonary arteries, extending into all lobes of both lungs. 4.9 cm mass in the right lower lobe, scattered additional pulmonary nodules bilateral. Prominent subcarinal node 1.4 cm.      11/13/2015 Pathology Results    Peritoneal mass biopsy showed adenocarcinoma, immunostaining was consistent with colorectal primary      History of present illness (11/09/2015):  Martin Grafton Tuttleis a 82 y.o.gentleman with a history melanoma affecting his left eye, HTN, DM, and GERD, recently diagnosed right colon cancer, was admitted to [facility] yesterday for worsening dyspnea. He CT scan showed aubmassive bilateral pulmonary embolism. He is on anticoagulation with heparin drip. He was scheduled to see me in my clinic tomorrow.  He presented with iron deficient anemia, was referred to local GI, underwent a colonoscopy which showed a partially obstructive Cecal mass. Biopsy showed invasive adenocarcinoma. CT scan showed multiple liver metastasis, ascites, and the  peritoneum carcinomatosis. He CT scan yesterday also showed a large right lower lobe lung mass. He underwent paracentesis this morning.  He lives alone, used to be very independent. His health has been declining in the past 3-4 months, especially in the past 2 weeks, he has progressive dyspnea, abdominal loading, is only able to walk for short distance. He also has significant fatigue, anorexia.   INTERVAL HISTORY: MR. Martin Leblanc came into my clinic for follow-up. I initially saw her in the hospital, she is accompanied by her daughters today. She was discharged to a nursing home last week. He is able to participate some physical therapy, but is still needs assistance when he walks. His abdomen is more bloated this week, and I have scheduled a paracentesis for him today. He has a mild low abdominal pain, tolerable, appetite has improved with Marinol, no nausea or other complaints. He is very anxious to start some treatment for his cancer.  REVIEW OF SYSTEMS:   Constitutional: Denies fevers, chills or abnormal weight loss, (+) fatigue  Eyes: Denies blurriness of vision Ears, nose, mouth, throat, and face: Denies mucositis or sore throat Respiratory: Denies cough, dyspnea or wheezes Cardiovascular: Denies palpitation, chest discomfort or lower extremity swelling Gastrointestinal:  Denies nausea, heartburn or change in bowel habits, (+) increased bloating Skin: Denies abnormal skin rashes Lymphatics: Denies new lymphadenopathy or easy bruising Neurological:Denies numbness, tingling or new weaknesses Behavioral/Psych: Mood is stable, no new changes  All other systems were reviewed with the patient and are negative.  MEDICAL HISTORY:  Past Medical History:  Diagnosis Date  . Diabetes mellitus  without complication (HCC)    Insulin use  . Edema extremities    right greater than left feet.  Marland Kitchen GERD (gastroesophageal reflux disease)   . Hypertension   . Malignant melanoma ([place])    s/p treatment 25  yrs ago-left eye  . Shortness of breath dyspnea    recent had hospital visit 7'17 [facility]- slightly improved.  . Sight impaired    left eye- hx melanoma/radiation    SURGICAL HISTORY: Past Surgical History:  Procedure Laterality Date  . CYSTOSCOPY W/ RETROGRADES Left 09/18/2015   Procedure: CYSTOSCOPY WITH RETROGRADE PYELOGRAM;  Surgeon: Raynelle Bring, MD;  Location: [facility];  Service: Urology;  Laterality: Left;  . CYSTOSCOPY WITH BIOPSY N/A 09/18/2015   Procedure: CYSTOSCOPY WITH BLADDER BIOPSY;  Surgeon: Raynelle Bring, MD;  Location: [facility];  Service: Urology;  Laterality: N/A;  . EYE SURGERY Left    laser eye surgery, after radiation for melanoma tx  . INGUINAL HERNIA REPAIR     early 2000s  . LUNG SURGERY      I have reviewed the social history and family history with the patient and they are unchanged from previous note.  ALLERGIES:  has No Known Allergies.  MEDICATIONS:  Current Outpatient Prescriptions  Medication Sig Dispense Refill  . albuterol (PROVENTIL HFA;VENTOLIN HFA) 108 (90 Base) MCG/ACT inhaler Inhale 2 puffs into the lungs every 4 (four) hours as needed for wheezing or shortness of breath. 1 Inhaler 5  . antiseptic oral rinse (BIOTENE) LIQD 15 mLs by Mouth Rinse route as needed for dry mouth.    Marland Kitchen atorvastatin (LIPITOR) 20 MG tablet Take 20 mg by mouth daily.    Marland Kitchen CIALIS 5 MG tablet Take 5 mg by mouth at bedtime as needed for urinary retention.    . clotrimazole (LOTRIMIN) 1 % cream Apply topically 2 (two) times daily. 30 g 0  . docusate sodium (COLACE) 100 MG capsule Take 100 mg by mouth daily as needed for mild constipation.    Marland Kitchen doxazosin (CARDURA) 4 MG tablet Take 4 mg by mouth daily.    Marland Kitchen dronabinol (MARINOL) 2.5 MG capsule Take 1 capsule (2.5 mg total) by mouth 2 (two) times daily before lunch and supper.    . enoxaparin (LOVENOX) 150 MG/ML injection Inject 0.67 mLs (100 mg total) into the skin 2 (two) times daily. 0 Syringe   . ferrous sulfate 325 (65 FE) MG  tablet Take 1 tablet (325 mg total) by mouth daily with breakfast. 30 tablet 3  . Hypromellose (ARTIFICIAL TEARS OP) Place 1 drop into both eyes daily as needed (dry eyes).    . insulin aspart (NOVOLOG) 100 UNIT/ML injection Inject 0-15 Units into the skin 3 (three) times daily with meals. 10 mL 11  . omeprazole (PRILOSEC) 20 MG capsule Take 20 mg by mouth daily.    . polyethylene glycol (MIRALAX / GLYCOLAX) packet Take 17 g by mouth 2 (two) times daily. 14 each 0  . solifenacin (VESICARE) 10 MG tablet Take 10 mg by mouth daily.    Marland Kitchen Specialty Vitamins Products (PROSTATE PO) Take 1 capsule by mouth daily.     No current facility-administered medications for this visit.     PHYSICAL EXAMINATION: ECOG PERFORMANCE STATUS: 3 - Symptomatic, >50% confined to bed  Vitals:   11/22/15 1301  BP: (!) 106/45  Pulse: 77  Resp: 18  Temp: 97.5 F (36.4 C)   Filed Weights   11/22/15 1301  Weight: 216 lb 14.4 oz (98.4 kg)  GENERAL:alert, no distress and comfortable SKIN: skin color, texture, turgor are normal, no rashes or significant lesions EYES: normal, Conjunctiva are pink and non-injected, sclera clear OROPHARYNX:no exudate, no erythema and lips, buccal mucosa, and tongue normal  NECK: supple, thyroid normal size, non-tender, without nodularity LYMPH:  no palpable lymphadenopathy in the cervical, axillary or inguinal LUNGS: clear to auscultation and percussion with normal breathing effort HEART: regular rate & rhythm and no murmurs and no lower extremity edema ABDOMEN:abdomen soft, Bloated, non-tender and normal bowel sounds Musculoskeletal:no cyanosis of digits and no clubbing  NEURO: alert & oriented x 3 with fluent speech, no focal motor/sensory deficits  LABORATORY DATA:  I have reviewed the data as listed CBC Latest Ref Rng & Units 11/16/2015 11/15/2015 11/14/2015  WBC 4.0 - 10.5 K/uL 8.8 7.8 7.6  Hemoglobin 13.0 - 17.0 g/dL 9.2(L) 8.9(L) 8.9(L)  Hematocrit 39.0 - 52.0 %  29.4(L) 28.6(L) 28.5(L)  Platelets 150 - 400 K/uL 308 314 336     CMP Latest Ref Rng & Units 11/16/2015 11/15/2015 11/14/2015  Glucose 65 - 99 mg/dL 145(H) 180(H) 139(H)  BUN 6 - 20 mg/dL _0 Creatinine 0.61 - 1.24 mg/dL 0.79 0.76 0.81  Sodium 135 - 145 mmol/L 134(L) 132(L) 135  Potassium 3.5 - 5.1 mmol/L 4.0 3.9 3.9  Chloride 101 - 111 mmol/L 104 102 105  CO2 22 - 32 mmol/L _1 Calcium 8.9 - 10.3 mg/dL 8.2(L) 8.3(L) 8.3(L)  Total Protein 6.5 - 8.1 g/dL - - -  Total Bilirubin 0.3 - 1.2 mg/dL - - -  Alkaline Phos 38 - 126 U/L - - -  AST 15 - 41 U/L - - -  ALT 17 - 63 U/L - - -   PATHOLOGY REPORT  Diagnosis 11/13/2015 Peritoneum, biopsy, anterior and omental - ADENOCARCINOMA. - SEE DESCRIPTION. Microscopic Comment The morphologic features are most consistent with metastatic colorectal adenocarcinoma. Immunohistochemistry will be performed and reported as an addendum. Called to Dr. Burr Medico on 11/14/15. (JDP:ds 11/14/15) ADDENDUM: Immunohistochemistry is performed at the request of Dr. Burr Medico and the tumor is strongly positive with CDX-2 and cytokeratin 20 and negative with thyroid transcription factor-1 and Napsin-A. The immunophenotype is consistent with metastatic colorectal adenocarcinoma. The paraffin block will be submitted for Foundation One testing at the request of Dr. Burr Medico. (JDP:kh 11/14/15) Martin Leblanc   RADIOGRAPHIC STUDIES: I have personally reviewed the radiological images as listed and agreed with the findings in the report.  CT abdomen And pelvis with contrast 11/06/2015 IMPRESSION: 1. There is intercostal herniation of the lung parenchyma in right chest wall laterally measures about 6 cm without evidence of acute complication. Progression in size of nodular mass in right lower lobe pleural based measures 4.8 cm suspicious for metastatic disease. 2. There is thickening of distal esophageal wall. Neoplastic process cannot be excluded. 3. Innumerable  nodular lesions are noted throughout the liver suspicious for metastatic disease. 4. There is focal thickening of distal gastric wall axial image 32 highly suspicious for a gastric mass. 5. There is abdominal and pelvic ascites. Nodular thickening of the omentum highly suspicious for metastatic disease and omental caking. Irregular thickening of cecal wall suspicious for a mass please see axial image 53 6. No distal colonic obstruction. 7. Enlarged prostate gland with indentation of urinary bladder base. 8. Degenerative changes thoracolumbar spine.  CT chest with and without contrast 11/08/2015 IMPRESSION: 1. Pulmonary embolus noted within both main pulmonary arteries, extending into all lobes of both lungs. Positive for  acute PE with CT evidence of right heart strain (RV/LV Ratio = 1.1) consistent with at least submassive (intermediate risk) PE. The presence of right heart strain has been associated with an increased risk of morbidity and mortality. Please activate Code PE by paging [phone]. 2. 4.9 cm mass at the right lower lobe. Scattered additional pulmonary nodules noted bilaterally, most prominent at the right lower lobe. On correlation with prior studies, this may reflect a bronchogenic primary malignancy with associated metastases. 3. Innumerable metastases noted throughout the liver. 4. Prominent subcarinal node, measuring 1.4 cm in short axis common may reflect metastatic disease. 5. Diffuse coronary artery calcifications seen. 6. Moderate ascites at the upper abdomen. 7. Irregular wall thickening again noted at the hiatal hernia. This could reflect underlying mass, given clinical concern, or simply intraluminal contents. However, it appears to be new from July, whereas the right lower lobe lung mass has gradually increased in size, and thus primary malignancy at the hiatal hernia is considered less likely.   ASSESSMENT & PLAN:  82 year old gentleman with  recently diagnosed right colon cancer, presented with progressive dyspnea, fatigue, and abdominal bloating. CT scan showed diffuse liver, peritoneum metastasis, ascites and a large right lower lobe lung mass.  1. Metastatic colon cancer to liver, peritoneum and lungs 2. Iron deficient anemia 3. Ascites, likely malignant 4. Deconditioning 5. Hypertension 6. Diabetes 7. Submassive PE 11/08/2015  Recommendations -I again reviewed his CT scan findings, and the peritoneum mass biopsy results, which confirmed metastatic colon cancer. -We reviewed the incurable nature of his cancer, and overall poor prognosis due to the extensive disease, his advanced age and multiple comorbidities -We discussed treatment options for metastatic lung cancer, including chemotherapy, immunotherapy, target therapy such as EGFR inhibitor. -His colon mass biopsy was tested for MMR, which was normal. His tumor is likely MSI stable, he is not a candidate for immunotherapy. -Due to his poor performance status, advanced age, and a medical comorbidities, I do not think he is a candidate for chemotherapy, although patient is eager to get anticancer treatment. I explained to him repeatedly that the risk of chemotherapy is outweighed the benefit of chemotherapy -I recommend him to focus on his symptom management. I'll arrange his paracentesis, if it becomes more frequent, such as weekly, then he will need a Pleurx placement. -His appetite has improved with Marinol, we'll continue and titrated dose if needed -He will continue Lovenox for his PE, he still on oxygen. -I did request a Foundation one genomic testing, to see if his tumor contains any targetable mutations. The results still pending. -If no target therapy available, I would recommend hospice. Patient appeared and still in denial, her daughters are realistic and receptive. -I plan to see him back in 3 weeks for follow-up and review of Foundation one test result. He knows to  call my office if he has any new concerns. -I'll arrange paracentesis in a week, and more if needed. His daughter will call us.   All questions were answered. The patient knows to call the clinic with any problems, questions or concerns. No barriers to learning was detected. I spent 30 minutes counseling the patient face to face. The total time spent in the appointment was 40 minutes and more than 50% was on counseling and review of test results     Truitt Merle, MD 11/22/15

## 2015-11-22 NOTE — Telephone Encounter (Signed)
Lm for Martin Leblanc and informed her of below message.  Nothing further needed.

## 2015-11-22 NOTE — Telephone Encounter (Signed)
Yes this is OK 

## 2015-11-24 ENCOUNTER — Telehealth: Payer: Self-pay | Admitting: *Deleted

## 2015-11-24 NOTE — Telephone Encounter (Signed)
Oncology Nurse Navigator Documentation  Oncology Nurse Navigator Flowsheets 11/24/2015  Navigator Location [facility]  Referral date to RadOnc/MedOnc -  Navigator Encounter Type Telephone  Telephone Incoming Call;Outgoing Call;Patient Update--daughter called to report his [employer] will no longer cover him being at [facility]. Suggest discharge home, but he can't be left alone at home. Asking if he is Hospice appropriate? Would Dr. Burr Medico or Dr. Forde Dandy be the one to refer him?  Abnormal Finding Date -  Confirmed Diagnosis Date -  Patient Visit Type -  Treatment Phase -  Barriers/Navigation Needs -  Interventions Other-per Dr. Burr Medico, he is hospice appropriate. Left VM for daughter w//this information and that Hospice will not provide care 24/7. He may be able to be admitted to Hospice home [facility]) or may need to be seen by Hospice in county of his residence in [place]. Could request referral of [facility] to see him in SNF, then help transition him to his permanent hospice care from there. Dr. Burr Medico or Dr. Forde Dandy would be appropriate.  Coordination of Care -  Acuity -  Time Spent with Patient -

## 2015-11-27 ENCOUNTER — Telehealth: Payer: Self-pay | Admitting: *Deleted

## 2015-11-27 NOTE — Telephone Encounter (Signed)
"  I received a referral and need demographics.  Please fax the patient's address, phone number to [phone]."

## 2015-11-27 NOTE — Telephone Encounter (Signed)
Called [facility] lab to follow up on status of his MSI and MMR testing request. Was informed there was insufficient tissue. They will fax report w/this documentation.

## 2015-11-27 NOTE — Telephone Encounter (Signed)
Spoke with Yvette @ [facility], and made referral to Hospice for pt.  Informed Dewaine Oats that Dr. Burr Medico will be the attending.  Asked Dewaine Oats to activate hospice standing orders as per protocol.  Yvette voiced understanding.

## 2015-11-27 NOTE — Telephone Encounter (Signed)
Was informed by Dewaine Oats at [facility] that due to pt's location,  [facility] was not able to provide services for pt.   Spoke with [facility], and made referral for hospice.   Faxed all pertinent info, and notes to Juliann Pulse to complete referral process.   Informed Juliann Pulse that Dr. Burr Medico will be the attending;  Hospice physicians to help manage symptoms for pt.   Juliann Pulse voiced understanding. Kathy[facility]   Phone     [phone]    ;     Fax      [phone].

## 2015-11-28 ENCOUNTER — Ambulatory Visit (HOSPITAL_COMMUNITY): Payer: Medicare Other

## 2015-11-28 ENCOUNTER — Telehealth: Payer: Self-pay | Admitting: *Deleted

## 2015-11-28 NOTE — Telephone Encounter (Signed)
Call from daughter, Ilona Sorrel asking what is her father's life expectancy without any treatment? Have decided to keep him at [facility] for now thinking he is too frail for assisted living-has already fallen twice. Still on Lovenox-will he be able to transition to something else? Reports [facility] and [facility] are working together.

## 2015-11-29 ENCOUNTER — Ambulatory Visit (HOSPITAL_COMMUNITY)
Admission: RE | Admit: 2015-11-29 | Discharge: 2015-11-29 | Disposition: A | Discharge status: Home / Self Care | Payer: Medicare Other | Source: Ambulatory Visit | Attending: Hematology | Admitting: Hematology

## 2015-11-29 ENCOUNTER — Ambulatory Visit: Payer: Medicare Other | Admitting: Emergency Medicine

## 2015-11-29 ENCOUNTER — Ambulatory Visit (HOSPITAL_COMMUNITY): Payer: Medicare Other

## 2015-11-29 DIAGNOSIS — C799 Secondary malignant neoplasm of unspecified site: Secondary | ICD-10-CM | POA: Insufficient documentation

## 2015-11-29 NOTE — Procedures (Signed)
Ultrasound-guided therapeutic paracentesis performed yielding 3.8 liters of amber fluid. No immediate complications.

## 2015-11-30 ENCOUNTER — Other Ambulatory Visit (HOSPITAL_COMMUNITY): Payer: Self-pay | Admitting: Internal Medicine

## 2015-11-30 ENCOUNTER — Telehealth: Payer: Self-pay | Admitting: *Deleted

## 2015-11-30 DIAGNOSIS — R188 Other ascites: Secondary | ICD-10-CM

## 2015-11-30 NOTE — Telephone Encounter (Signed)
Daughter called to report 3.8 liters of fluid were removed from abdomen on Wednesday via paracentesis. Asked if Dr. Burr Medico will write orders for it to be done weekly? Daughter called back later to report seeing Dr. Inda Merlin at [facility] and he wrote for weekly paracentesis.

## 2015-12-01 ENCOUNTER — Telehealth: Payer: Self-pay | Admitting: *Deleted

## 2015-12-01 NOTE — Telephone Encounter (Signed)
Voice mail from daughter, Marlowe Sax that his paracentesis is on 11/29 at [facility]. They did not have opening on 11/30 for the paracentesis. Asking if his 11/30 visit with Dr. Burr Medico could be moved to 11/29 to just make one trip in to [place]? Will forward request to MD to approve change.

## 2015-12-04 ENCOUNTER — Telehealth: Payer: Self-pay | Admitting: *Deleted

## 2015-12-04 NOTE — Telephone Encounter (Signed)
Oncology Nurse Navigator Documentation  Oncology Nurse Navigator Flowsheets 12/04/2015  Navigator Location [facility]  Referral date to RadOnc/MedOnc -  Navigator Encounter Type Telephone  Telephone Appt Confirmation/Clarification  Abnormal Finding Date -  Confirmed Diagnosis Date -  Patient Visit Type -  Treatment Phase Other--supportive care  Barriers/Navigation Needs Coordination of Care  Interventions Coordination of Care  Coordination of Care Appts--rescheduled appointments of 11/30 to 11/29 per MD order. Daughter will relay this to her sister and to [facility].  Acuity -  Time Spent with Patient 15

## 2015-12-06 ENCOUNTER — Encounter (HOSPITAL_COMMUNITY): Payer: Self-pay

## 2015-12-06 ENCOUNTER — Ambulatory Visit (HOSPITAL_COMMUNITY)
Admission: RE | Admit: 2015-12-06 | Discharge: 2015-12-06 | Disposition: A | Discharge status: Home / Self Care | Source: Ambulatory Visit | Attending: Internal Medicine | Admitting: Internal Medicine

## 2015-12-06 DIAGNOSIS — R188 Other ascites: Secondary | ICD-10-CM | POA: Diagnosis not present

## 2015-12-06 NOTE — Procedures (Signed)
Ultrasound-guided  therapeutic paracentesis performed yielding 3.2 liters of amber fluid. No immediate complications.  

## 2015-12-13 ENCOUNTER — Ambulatory Visit (HOSPITAL_COMMUNITY)
Admission: RE | Admit: 2015-12-13 | Discharge: 2015-12-13 | Disposition: A | Discharge status: Home / Self Care | Payer: Medicare Other | Source: Ambulatory Visit | Attending: Internal Medicine | Admitting: Internal Medicine

## 2015-12-13 ENCOUNTER — Encounter (HOSPITAL_COMMUNITY): Payer: Self-pay

## 2015-12-13 DIAGNOSIS — R188 Other ascites: Secondary | ICD-10-CM

## 2015-12-15 ENCOUNTER — Telehealth: Payer: Self-pay

## 2015-12-15 ENCOUNTER — Other Ambulatory Visit: Payer: Self-pay | Admitting: General Surgery

## 2015-12-15 ENCOUNTER — Ambulatory Visit (HOSPITAL_COMMUNITY)
Admission: RE | Admit: 2015-12-15 | Discharge: 2015-12-15 | Disposition: A | Discharge status: Home / Self Care | Source: Ambulatory Visit | Attending: General Surgery | Admitting: General Surgery

## 2015-12-15 DIAGNOSIS — R188 Other ascites: Secondary | ICD-10-CM | POA: Diagnosis not present

## 2015-12-15 NOTE — Procedures (Signed)
Ultrasound-guided therapeutic paracentesis performed yielding 2.6 liters of yellow colored fluid. No immediate complications.  Karla Vines E 2:50 PM 12/15/2015

## 2015-12-15 NOTE — Telephone Encounter (Signed)
Daughter called. Pt is getting a paracentesis today at 1300 at [facility]. The pt is willing to get a PleurX catheter. Daughter is aware Dr Burr Medico is out of office today. She is aware a permanent catheter may be ordered today if this note is received or next week. Daughter will also reach out to hospice MD or Dr Inda Merlin for order.  Dr Inda Merlin is considering changing the coumadin b/c his INR is unstable on warfarin.

## 2015-12-18 ENCOUNTER — Telehealth: Payer: Self-pay | Admitting: *Deleted

## 2015-12-18 ENCOUNTER — Other Ambulatory Visit: Payer: Self-pay | Admitting: Hematology

## 2015-12-18 DIAGNOSIS — C189 Malignant neoplasm of colon, unspecified: Secondary | ICD-10-CM

## 2015-12-18 DIAGNOSIS — C787 Secondary malignant neoplasm of liver and intrahepatic bile duct: Principal | ICD-10-CM

## 2015-12-18 NOTE — Telephone Encounter (Signed)
I have ordered pleurx placement for 11/29 or 12/1, but not sure if IR can accommodate. Please call IR and update pt's daughter.   thanks  Truitt Merle MD

## 2015-12-18 NOTE — Telephone Encounter (Signed)
Received vm call from pt stating that pt is at [place] has discussed with pt & pt agrees to have peritoneal cath & would like to have this set up with 11/29 paracentesis or for Friday.  She states that pt's INR was up & coumadin was switched to ASA.  He has appt for lab/ MD/ para 12/20/15.  Message to Dr Burr Medico.

## 2015-12-19 ENCOUNTER — Telehealth: Payer: Self-pay | Admitting: *Deleted

## 2015-12-19 ENCOUNTER — Telehealth: Payer: Self-pay

## 2015-12-19 NOTE — Telephone Encounter (Signed)
S/w IR and ASA 325 or less is OK for paracentesis and PleurX drain placement. S/w Patti about Dr Burr Medico response and ASA. She is OK with this. Wednesday appts cancelled. [place] informed.

## 2015-12-19 NOTE — Telephone Encounter (Signed)
Pt has enrolled to hospice, OK to cancel his lab and f/u on 11/29 with me, if he is not coming for paracentesis tomorrow. I will be happy to call Precious Bard later today if needed. Please ask IR when to hold ASA before procedure on Friday. He is on ASA for PE, by his PCP.   Truitt Merle MD

## 2015-12-19 NOTE — Telephone Encounter (Signed)
Voice mail from daughter to follow up on status of moving his paracentesis w/cath placement to tomorrow. Forwarded message to collaborative nurse to contact daughter w/status.

## 2015-12-19 NOTE — Telephone Encounter (Signed)
S/w patti caviness about schedule. That IR was scheduled for Friday for pleurx cath placement and paracentesis. He has appt for lab and md tomorrow, Wednesday. She needs to discuss his taking ASA 325 daily and when to stop this.  Discussed NPO after 7 am, going to registration at [facility], prep in medical day, procedure in IR. Precious Bard stated she thought it may be better to cancel wed paracentesis so adequate amount of fluid would be in abd cavity for PleurX placement on Friday.

## 2015-12-20 ENCOUNTER — Other Ambulatory Visit: Payer: Medicare Other

## 2015-12-20 ENCOUNTER — Ambulatory Visit (HOSPITAL_COMMUNITY): Payer: Medicare Other

## 2015-12-20 ENCOUNTER — Other Ambulatory Visit: Payer: Self-pay | Admitting: General Surgery

## 2015-12-20 ENCOUNTER — Ambulatory Visit: Payer: Medicare Other | Admitting: Hematology

## 2015-12-21 ENCOUNTER — Other Ambulatory Visit: Payer: Medicare Other

## 2015-12-21 ENCOUNTER — Ambulatory Visit: Payer: Medicare Other | Admitting: Hematology

## 2015-12-21 ENCOUNTER — Other Ambulatory Visit: Payer: Self-pay | Admitting: Radiology

## 2015-12-22 ENCOUNTER — Ambulatory Visit (HOSPITAL_COMMUNITY)
Admission: RE | Admit: 2015-12-22 | Discharge: 2015-12-22 | Disposition: A | Discharge status: Home / Self Care | Source: Ambulatory Visit | Attending: Hematology | Admitting: Hematology

## 2015-12-22 ENCOUNTER — Encounter (HOSPITAL_COMMUNITY): Payer: Self-pay

## 2015-12-22 ENCOUNTER — Other Ambulatory Visit: Payer: Self-pay | Admitting: Hematology

## 2015-12-22 DIAGNOSIS — Z9981 Dependence on supplemental oxygen: Secondary | ICD-10-CM | POA: Diagnosis not present

## 2015-12-22 DIAGNOSIS — C189 Malignant neoplasm of colon, unspecified: Secondary | ICD-10-CM | POA: Diagnosis not present

## 2015-12-22 DIAGNOSIS — Z87891 Personal history of nicotine dependence: Secondary | ICD-10-CM | POA: Insufficient documentation

## 2015-12-22 DIAGNOSIS — Z85820 Personal history of malignant melanoma of skin: Secondary | ICD-10-CM | POA: Diagnosis not present

## 2015-12-22 DIAGNOSIS — C787 Secondary malignant neoplasm of liver and intrahepatic bile duct: Principal | ICD-10-CM

## 2015-12-22 DIAGNOSIS — I10 Essential (primary) hypertension: Secondary | ICD-10-CM | POA: Insufficient documentation

## 2015-12-22 DIAGNOSIS — R180 Malignant ascites: Secondary | ICD-10-CM | POA: Insufficient documentation

## 2015-12-22 DIAGNOSIS — E119 Type 2 diabetes mellitus without complications: Secondary | ICD-10-CM | POA: Diagnosis not present

## 2015-12-22 DIAGNOSIS — K219 Gastro-esophageal reflux disease without esophagitis: Secondary | ICD-10-CM | POA: Diagnosis not present

## 2015-12-22 HISTORY — PX: IR GENERIC HISTORICAL: IMG1180011

## 2015-12-22 LAB — CBC
HEMATOCRIT: 29.4 % — AB (ref 39.0–52.0)
Hemoglobin: 9.4 g/dL — ABNORMAL LOW (ref 13.0–17.0)
MCH: 24.6 pg — ABNORMAL LOW (ref 26.0–34.0)
MCHC: 32 g/dL (ref 30.0–36.0)
MCV: 77 fL — AB (ref 78.0–100.0)
Platelets: 574 10*3/uL — ABNORMAL HIGH (ref 150–400)
RBC: 3.82 MIL/uL — ABNORMAL LOW (ref 4.22–5.81)
RDW: 21.5 % — ABNORMAL HIGH (ref 11.5–15.5)
WBC: 15.1 10*3/uL — ABNORMAL HIGH (ref 4.0–10.5)

## 2015-12-22 LAB — GLUCOSE, CAPILLARY: GLUCOSE-CAPILLARY: 163 mg/dL — AB (ref 65–99)

## 2015-12-22 LAB — PROTIME-INR
INR: 1.34
Prothrombin Time: 16.7 seconds — ABNORMAL HIGH (ref 11.4–15.2)

## 2015-12-22 LAB — APTT: aPTT: 32 seconds (ref 24–36)

## 2015-12-22 MED ORDER — MIDAZOLAM HCL 2 MG/2ML IJ SOLN
INTRAMUSCULAR | Status: AC
  Filled 2015-12-22: qty 4

## 2015-12-22 MED ORDER — FENTANYL CITRATE (PF) 100 MCG/2ML IJ SOLN
INTRAMUSCULAR | Status: AC
  Filled 2015-12-22: qty 4

## 2015-12-22 MED ORDER — SODIUM CHLORIDE 0.9 % IV SOLN
INTRAVENOUS | Status: DC
  Administered 2015-12-22: 12:00:00 via INTRAVENOUS

## 2015-12-22 MED ORDER — ONDANSETRON HCL 4 MG/2ML IJ SOLN
4.0000 mg | Freq: Once | INTRAMUSCULAR | Status: AC
  Administered 2015-12-22: 4 mg via INTRAVENOUS
  Filled 2015-12-22: qty 2

## 2015-12-22 MED ORDER — LIDOCAINE HCL 1 % IJ SOLN
INTRAMUSCULAR | Status: AC
  Filled 2015-12-22: qty 20

## 2015-12-22 MED ORDER — CEFAZOLIN SODIUM-DEXTROSE 2-4 GM/100ML-% IV SOLN
2.0000 g | INTRAVENOUS | Status: AC
  Administered 2015-12-22: 2 g via INTRAVENOUS
  Filled 2015-12-22: qty 100

## 2015-12-22 MED ORDER — FENTANYL CITRATE (PF) 100 MCG/2ML IJ SOLN
INTRAMUSCULAR | Status: AC | PRN
  Administered 2015-12-22: 50 ug via INTRAVENOUS

## 2015-12-22 MED ORDER — LIDOCAINE HCL 1 % IJ SOLN
INTRAMUSCULAR | Status: AC | PRN
  Administered 2015-12-22: 15 mL

## 2015-12-22 MED ORDER — MIDAZOLAM HCL 2 MG/2ML IJ SOLN
INTRAMUSCULAR | Status: AC | PRN
  Administered 2015-12-22: 1 mg via INTRAVENOUS
  Administered 2015-12-22: 0.5 mg via INTRAVENOUS

## 2015-12-22 NOTE — Sedation Documentation (Signed)
Patient is resting comfortably. 

## 2015-12-22 NOTE — Sedation Documentation (Signed)
Patient is comfortable.

## 2015-12-22 NOTE — Discharge Instructions (Addendum)
Ascites Drainage Catheter Placement, Care After Refer to this sheet in the next few weeks. These instructions provide you with information about caring for yourself after your procedure. Your health care provider may also give you more specific instructions. Your treatment has been planned according to current medical practices, but problems sometimes occur. Call your health care provider if you have any problems or questions after your procedure. What can I expect after the procedure? After the procedure, it is common to have:  Pain, soreness, and bruising near the area where the catheter was placed.  Drowsiness. This may last for several hours.  Trouble remembering things. The medicine that was given to help you relax during the procedure (sedative) may take several hours to fully wear off. Follow these instructions at home:  Rest, but do not spend the whole day in bed. Walk around to stay active.  Weigh yourself every day. A change in weight may mean that too much fluid is building up in your body. Ask how much change in your weight would give you a reason to call or visit your health care provider.  Take medicines only as directed by your health care provider.  Keep all follow-up visits as directed by your health care provider. This is important.  If you are asked to drain fluid at home, follow instructions from your health care provider about how to do this.  Follow any dietary instructions from your health care provider. You may need to:  Limit foods and liquids that increase or decrease how much fluid your body holds onto.  Limit how much salt (sodium) you eat.  Eat a high-protein diet.  Avoid alcohol.  There are many different ways to close and cover an incision, including stitches (sutures), skin glue, and adhesive strips. Follow your health care providers instructions about:  Incision care.  Bandage (dressing) changes and removal.  Incision closure removal.  Check  your incision area every day for signs of infection. Watch for:  Redness, swelling, or pain.  Fluid, blood, or pus. Contact a health care provider if:  You have skin irritation, redness, or pain where the catheter was inserted (insertion site).  You have trouble draining fluid.  You have trouble caring for your catheter.  You have abdominal pain, bloating, or cramping. Get help right away if:  You develop a fever or chills.  You have increasing redness or increasing pain around the insertion site.  You have blood or pus coming from the insertion site.  Fluid that drains out of the catheter becomes cloudy or has a bad smell.  You have abdominal pain or cramping that worsens or does not go away. This information is not intended to replace advice given to you by your health care provider. Make sure you discuss any questions you have with your health care provider. Document Released: 12/27/2010 Document Revised: 09/14/2015 Document Reviewed: 01/03/2014 Elsevier Interactive Patient Education  2017 [employer]. Paracentesis, Care After Introduction Refer to this sheet in the next few weeks. These instructions provide you with information about caring for yourself after your procedure. Your health care provider may also give you more specific instructions. Your treatment has been planned according to current medical practices, but problems sometimes occur. Call your health care provider if you have any problems or questions after your procedure. What can I expect after the procedure? After your procedure, it is common to have a small amount of clear fluid coming from the puncture site. Follow these instructions at home:  Return to your normal activities as told by your health care provider. Ask your health care provider what activities are safe for you.  Take over-the-counter and prescription medicines only as told by your health care provider.  Do not take baths, swim, or use a hot  tub until your health care provider approves.  Follow instructions from your health care provider about:  How to take care of your puncture site.  When and how you should change your bandage (dressing).  When you should remove your dressing.  Check your puncture area every day signs of infection. Watch for:  Redness, swelling, or pain.  Fluid, blood, or pus.  Keep all follow-up visits as told by your health care provider. This is important. Contact a health care provider if:  You have redness, swelling, or pain at your puncture site.  You start to have more clear fluid coming from your puncture site.  You have blood or pus coming from your puncture site.  You have chills.  You have a fever. Get help right away if:  You develop chest pain or shortness of breath.  You develop increasing pain, discomfort, or swelling in your abdomen.  You feel dizzy or light-headed or you pass out. This information is not intended to replace advice given to you by your health care provider. Make sure you discuss any questions you have with your health care provider. Document Released: 05/24/2014 Document Revised: 06/15/2015 Document Reviewed: 03/22/2014  2017 Else[employer] Ascites Drainage Catheter Placement, Care After Refer to this sheet in the next few weeks. These instructions provide you with information about caring for yourself after your procedure. Your health care provider may also give you more specific instructions. Your treatment has been planned according to current medical practices, but problems sometimes occur. Call your health care provider if you have any problems or questions after your procedure. What can I expect after the procedure? After the procedure, it is common to have:  Pain, soreness, and bruising near the area where the catheter was placed.  Drowsiness. This may last for several hours.  Trouble remembering things. The medicine that was given to help you relax  during the procedure (sedative) may take several hours to fully wear off. Follow these instructions at home:  Rest, but do not spend the whole day in bed. Walk around to stay active.  Weigh yourself every day. A change in weight may mean that too much fluid is building up in your body. Ask how much change in your weight would give you a reason to call or visit your health care provider.  Take medicines only as directed by your health care provider.  Keep all follow-up visits as directed by your health care provider. This is important.  If you are asked to drain fluid at home, follow instructions from your health care provider about how to do this.  Follow any dietary instructions from your health care provider. You may need to:  Limit foods and liquids that increase or decrease how much fluid your body holds onto.  Limit how much salt (sodium) you eat.  Eat a high-protein diet.  Avoid alcohol.  There are many different ways to close and cover an incision, including stitches (sutures), skin glue, and adhesive strips. Follow your health care providers instructions about:  Incision care.  Bandage (dressing) changes and removal.  Incision closure removal.  Check your incision area every day for signs of infection. Watch for:  Redness, swelling, or pain.  Fluid, blood,  or pus. Contact a health care provider if:  You have skin irritation, redness, or pain where the catheter was inserted (insertion site).  You have trouble draining fluid.  You have trouble caring for your catheter.  You have abdominal pain, bloating, or cramping. Get help right away if:  You develop a fever or chills.  You have increasing redness or increasing pain around the insertion site.  You have blood or pus coming from the insertion site.  Fluid that drains out of the catheter becomes cloudy or has a bad smell.  You have abdominal pain or cramping that worsens or does not go away. This  information is not intended to replace advice given to you by your health care provider. Make sure you discuss any questions you have with your health care provider. Document Released: 12/27/2010 Document Revised: 09/14/2015 Document Reviewed: 01/03/2014 Elsevier Interactive Patient Education  2017 [place]. Moderate Conscious Sedation, Adult, Care After These instructions provide you with information about caring for yourself after your procedure. Your health care provider may also give you more specific instructions. Your treatment has been planned according to current medical practices, but problems sometimes occur. Call your health care provider if you have any problems or questions after your procedure. What can I expect after the procedure? After your procedure, it is common:  To feel sleepy for several hours.  To feel clumsy and have poor balance for several hours.  To have poor judgment for several hours.  To vomit if you eat too soon. Follow these instructions at home: For at least 24 hours after the procedure:   Do not:  Participate in activities where you could fall or become injured.  Drive.  Use heavy machinery.  Drink alcohol.  Take sleeping pills or medicines that cause drowsiness.  Make important decisions or sign legal documents.  Take care of children on your own.  Rest. Eating and drinking  Follow the diet recommended by your health care provider.  If you vomit:  Drink water, juice, or soup when you can drink without vomiting.  Make sure you have little or no nausea before eating solid foods. General instructions  Have a responsible adult stay with you until you are awake and alert.  Take over-the-counter and prescription medicines only as told by your health care provider.  If you smoke, do not smoke without supervision.  Keep all follow-up visits as told by your health care provider. This is important. Contact a health care provider  if:  You keep feeling nauseous or you keep vomiting.  You feel light-headed.  You develop a rash.  You have a fever. Get help right away if:  You have trouble breathing. This information is not intended to replace advice given to you by your health care provider. Make sure you discuss any questions you have with your health care provider. Document Released: 10/28/2012 Document Revised: 06/12/2015 Document Reviewed: 04/29/2015 Elsevier Interactive Patient Education  2017 [employer].

## 2015-12-22 NOTE — H&P (Signed)
Chief Complaint: recurrent malignant ascites  Referring Physician:Dr. Truitt Merle  Supervising Physician: Daryll Brod  Patient Status: [facility] - Out-pt  HPI: Martin Leblanc is an 82 y.o. male with a history of metastatic colon cancer with recurrent abdominal ascites.  He comes in every Wednesday for the last couple of months for paracenteses.  He is elderly and becoming more difficult to transport to and from his facility.  A request has been made for a PleurX catheter to be placed to help with easier management of his ascites.  He admits to some chronic shortness of breath, requiring continuous O2 therapy.  He also admits to some nausea and acid reflux.  He has no other new complaints today.  Past Medical History:  Past Medical History:  Diagnosis Date  . Diabetes mellitus without complication (HCC)    Insulin use  . Edema extremities    right greater than left feet.  Marland Kitchen GERD (gastroesophageal reflux disease)   . Hypertension   . Malignant melanoma ([place])    s/p treatment 25 yrs ago-left eye  . Shortness of breath dyspnea    recent had hospital visit 7'17 [facility]- slightly improved.  . Sight impaired    left eye- hx melanoma/radiation    Past Surgical History:  Past Surgical History:  Procedure Laterality Date  . CYSTOSCOPY W/ RETROGRADES Left 09/18/2015   Procedure: CYSTOSCOPY WITH RETROGRADE PYELOGRAM;  Surgeon: Raynelle Bring, MD;  Location: [facility] ORS;  Service: Urology;  Laterality: Left;  . CYSTOSCOPY WITH BIOPSY N/A 09/18/2015   Procedure: CYSTOSCOPY WITH BLADDER BIOPSY;  Surgeon: Raynelle Bring, MD;  Location: [facility] OR[facility];  Service: Urology;  Laterality: N/A;  . EYE SURGERY Left    laser eye surgery, after radiation for melanoma tx  . INGUINAL HERNIA REPAIR     early 2000s  . LUNG SURGERY      Family History:  Family History  Problem Relation Age of Onset  . Diabetes Maternal Grandmother   . Emphysema Father     Social History:  reports that he quit smoking about 42 years ago. His  smoking use included Cigarettes. He has a 10.00 pack-year smoking history. He has never used smokeless tobacco. He reports that he does not drink alcohol or use drugs.  Allergies: No Known Allergies  Medications: Medications reviewed in epic  Please HPI for pertinent positives, otherwise complete 10 system ROS negative, except throat pain and hiccups.  Mallampati Score: MD Evaluation Airway: WNL Heart: WNL Abdomen: WNL Chest/ Lungs: WNL ASA  Classification: 3 Mallampati/Airway Score: One  Physical Exam: BP 119/69 (BP Location: Right Arm)   Pulse (!) 110   Temp 98 F (36.7 C) (Oral)   Resp 18   SpO2 99%  There is no height or weight on file to calculate BMI. General: pleasant, elderly, debilitated white male who is laying in bed in NAD HEENT: head is normocephalic, atraumatic.  Sclera are noninjected.  PERRL.  Ears and nose without any masses or lesions.  Mouth is pink, but he has white plaques in his throat consistent with candida. Heart: regular, rate, and rhythm.  Normal s1,s2. No obvious murmurs, gallops, or rubs noted.  Palpable radial pulses bilaterally Lungs: CTAB, no wheezes, rhonchi, or rales noted.  Respiratory effort nonlabored Abd: soft, NT, some distention with minimal fluid wave, +BS, no masses or organomegaly Psych: A&Ox3 with an appropriate affect.   Labs: Pending  Imaging: No results found.  Assessment/Plan 1. Metastatic colon cancer with recurrent abdominal ascites -we will plan  to proceed with PleurX catheter placement in his abdomen today as long as he has enough ascites today to proceed.  We skipped his paracentesis on Wednesday of this week in hopes we can proceed today. -vitals reviewed, labs are pending -Risks and Benefits discussed with the patient including bleeding, infection, damage to adjacent structures, bowel perforation, and sepsis. All of the patient's questions were answered, patient is agreeable to proceed. Consent signed and in  chart.    Thank you for this interesting consult.  I greatly enjoyed meeting ALEXA PANDYA and look forward to participating in their care.  A copy of this report was sent to the requesting provider on this date.  Electronically Signed: Henreitta Cea 12/22/2015, 12:14 PM   I spent a total of  30 Minutes   in face to face in clinical consultation, greater than 50% of which was counseling/coordinating care for malignant ascites, recurrent

## 2015-12-22 NOTE — Progress Notes (Signed)
Report called to [place] at [facility].

## 2015-12-22 NOTE — Procedures (Signed)
Malignant ascites, abdominal distention, colon cancer  S/p RLQ ABDOMINAL TUNNELED DRAIN(PLEURX)  1.1 L PARA PERFORMED  NO COMP STABLE FULL REPORT IN PACS

## 2015-12-26 ENCOUNTER — Other Ambulatory Visit (HOSPITAL_COMMUNITY): Payer: Medicare Other

## 2015-12-27 ENCOUNTER — Ambulatory Visit (HOSPITAL_COMMUNITY): Payer: Medicare Other

## 2016-01-02 ENCOUNTER — Telehealth: Payer: Self-pay | Admitting: *Deleted

## 2016-01-22 NOTE — Telephone Encounter (Signed)
Received call from [facility] stating Hospice called them stating Dr Jaynee Eagles needed to sign patient's death certificate. Advised the gentleman there is no record in patient's EMR of ever having seen Dr Jaynee Eagles. He verbalized understanding.

## 2016-01-22 DEATH — deceased

## 2018-06-30 IMAGING — DX DG CHEST 1V PORT
1 series · 1 of 1 positions shown · non-contrast
Comparison: Portable exam 6555 hours compared to 07/24/2015

CLINICAL DATA: Shortness of breath worsening today, history of
cancer of the lung, colon and liver, diabetes mellitus,
hypertension, melanoma

EXAM:
PORTABLE CHEST 1 VIEW

[chest ap]
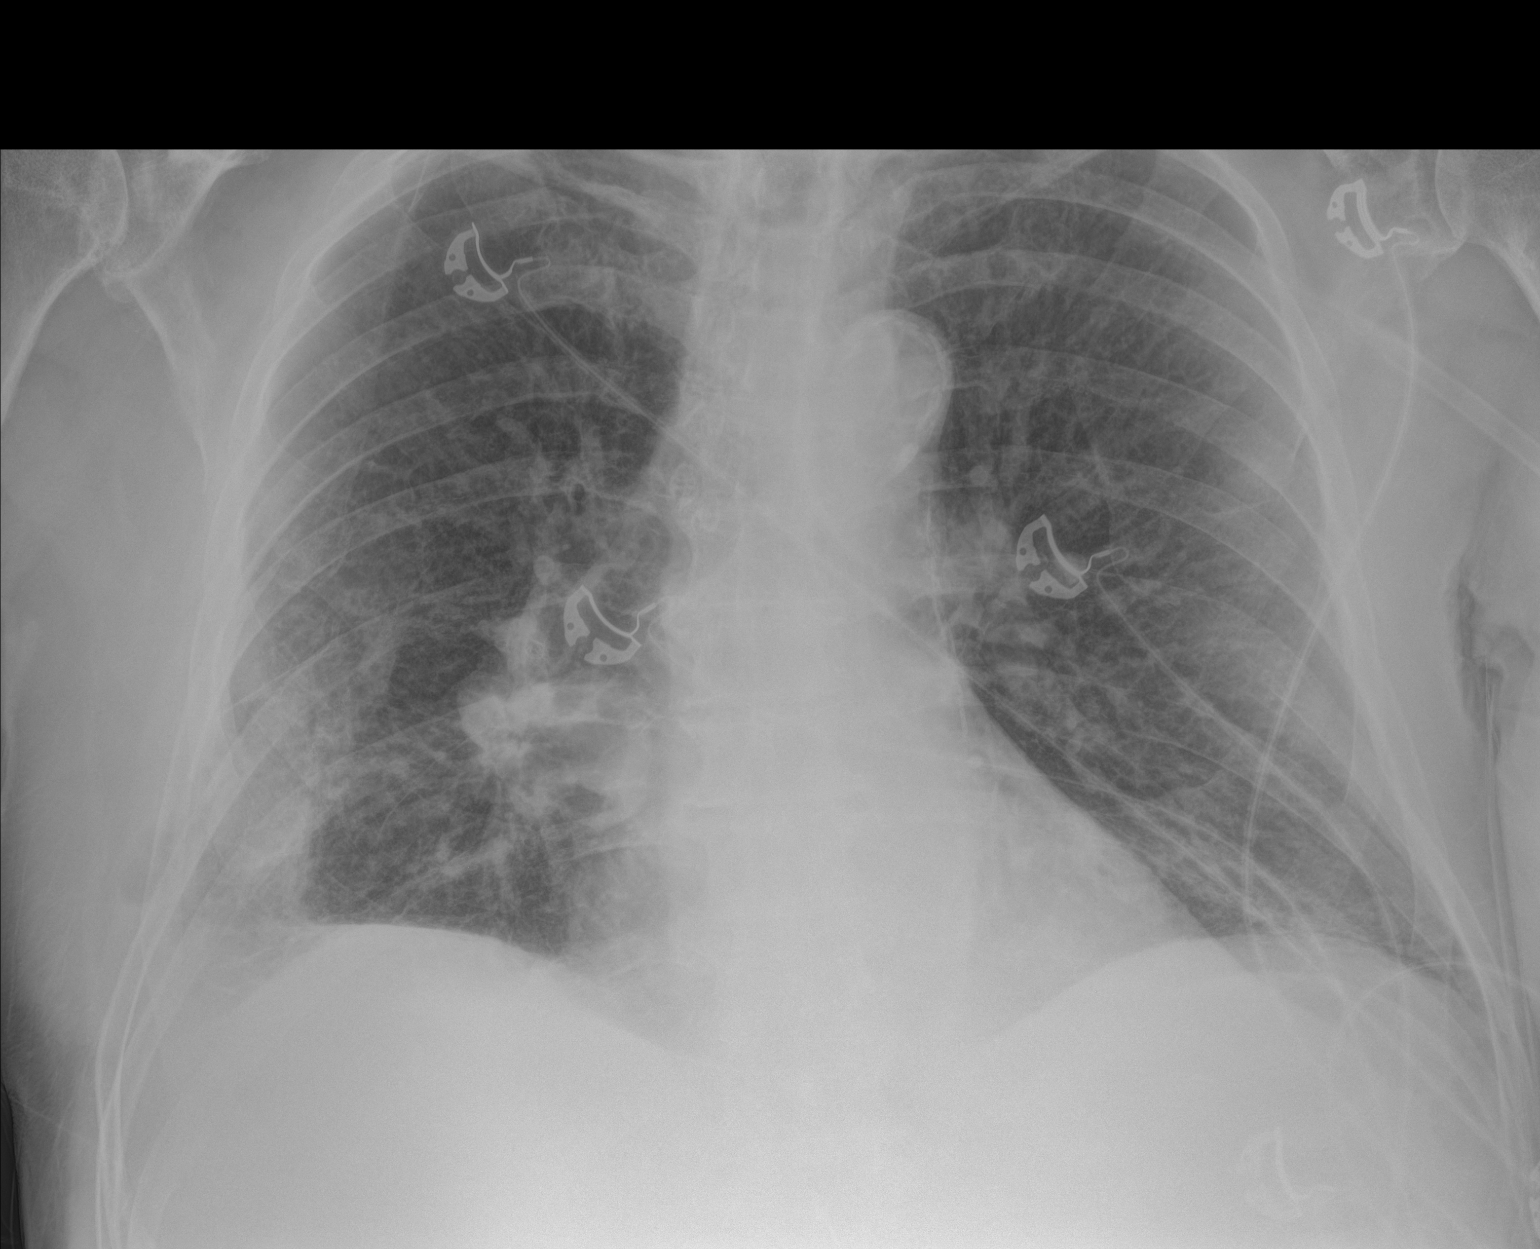

[1 of 1 positions shown; findings below may reference images not displayed]

FINDINGS: Normal heart size, mediastinal contours, and pulmonary vascularity.

Atherosclerotic calcification aorta.

Again identified peripheral opacity at the lower RIGHT chest
question atelectasis versus scarring unchanged.

Small focus of gas at the inferior RIGHT chest projects beyond the
costal margin, unchanged since prior chest radiograph but not
localized on CT.

RIGHT infrahilar opacity corresponding to rounded opacity on prior
CT again seen.

Remaining lungs clear.

No pleural effusion or pneumothorax.
IMPRESSION: Chronic opacity at the lateral lower RIGHT chest may represent
scarring or chronic atelectasis, less likely chronic infiltrate.

Rounded RIGHT infrahilar opacity appears unchanged since prior CT,
was of fluid attenuation on prior CT question loculated fluid versus
necrotic nodule.

Aortic atherosclerosis.

Remainder of exam unremarkable.

## 2018-07-02 IMAGING — MR MR HEAD WO/W CM
10 of 13 series · 32 of 48 positions shown · IV contrast (Yes)
Comparison: Chest CTA 11/08/2015.

CLINICAL DATA: 86-year-old male with metastatic melanoma. Acute
pulmonary emboli. Left ocular melanoma status post brachytherapy.
Staging. Subsequent encounter.

EXAM:
MRI HEAD WITHOUT AND WITH CONTRAST
TECHNIQUE: Multiplanar, multiecho pulse sequences of the brain and surrounding
structures were obtained without and with intravenous contrast.
CONTRAST:  20mL MULTIHANCE GADOBENATE DIMEGLUMINE 529 MG/ML IV SOLN

[Series 3: T1 · sagittal · 5.0mm · 0.47mm/px · 1 of 24 slices shown]
[im 1/24]
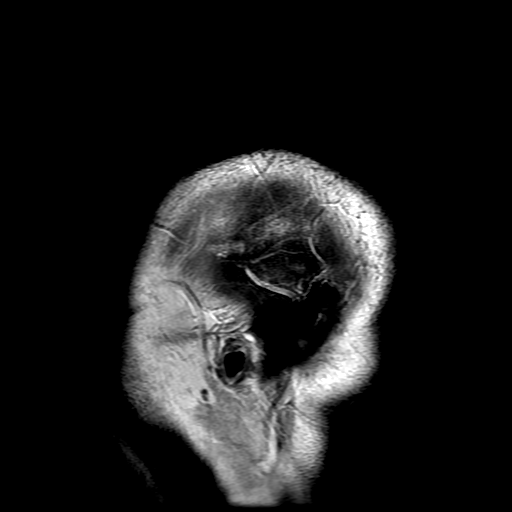

[Series 4: DWI · axial · 3.0mm · 1.09mm/px · z∈[-58,+91]mm · 8 of 102 slices shown (1 of 4)]
[im 1/102]
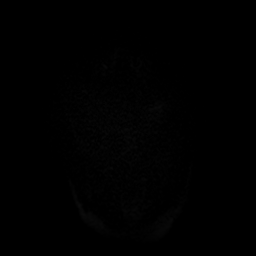
[im 12/102]
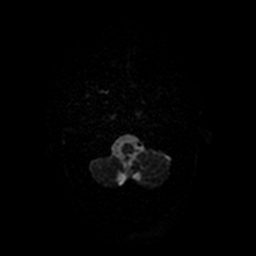
[im 34/102]
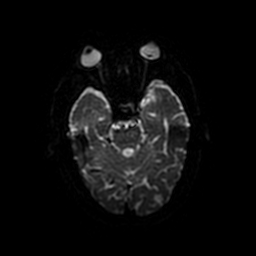
[im 45/102]
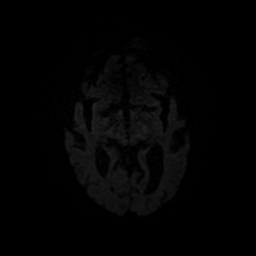
[im 57/102]
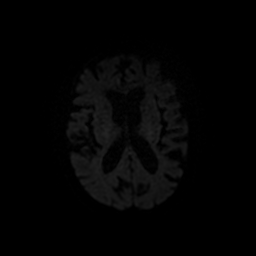
[im 68/102]
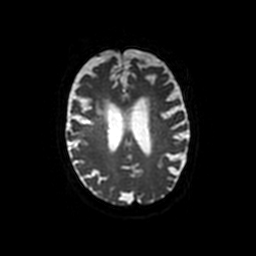
[im 90/102]
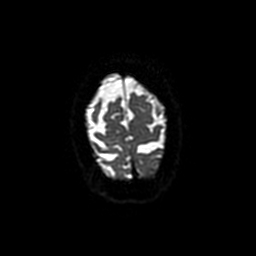
[im 102/102]
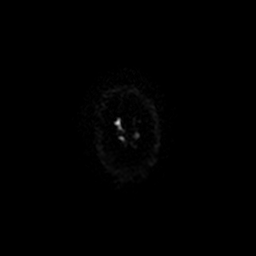

[Series 5: DWI · coronal · 5.0mm · 1.09mm/px · 6 of 68 slices shown (2 of 4)]
[im 1/68]
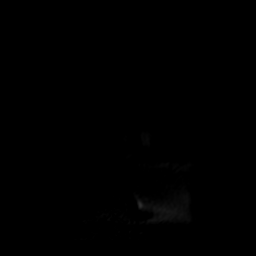
[im 14/68]
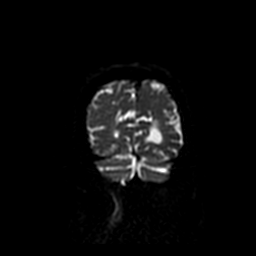
[im 27/68]
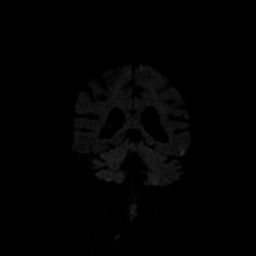
[im 41/68]
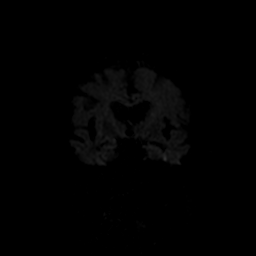
[im 54/68]
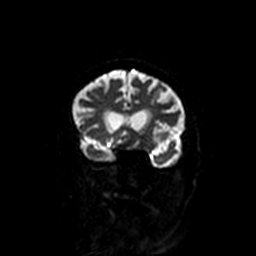
[im 68/68]
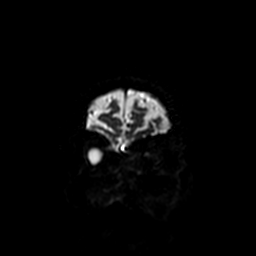

[Series 6: T2 · axial · 5.0mm · 0.43mm/px · z∈[-63,+85]mm · 2 of 26 slices shown]
[im 1/26]
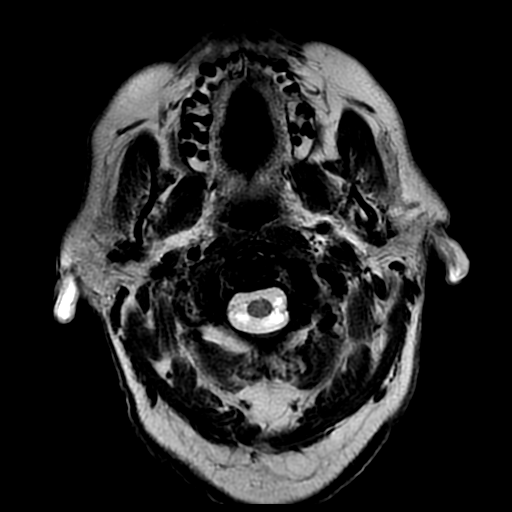
[im 26/26]
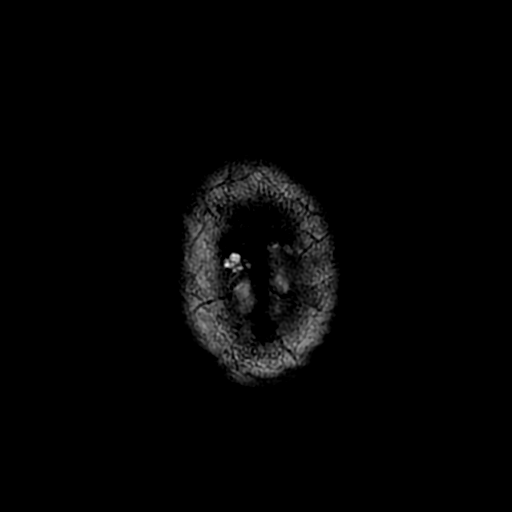

[Series 7: FLAIR · axial · 5.0mm · 0.43mm/px · z∈[-63,+85]mm · 2 of 26 slices shown]
[im 1/26]
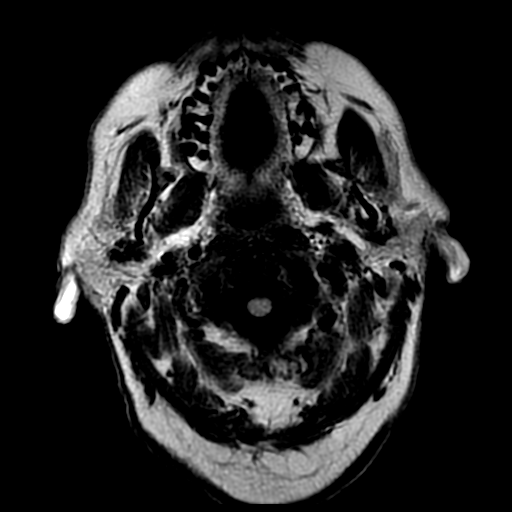
[im 26/26]
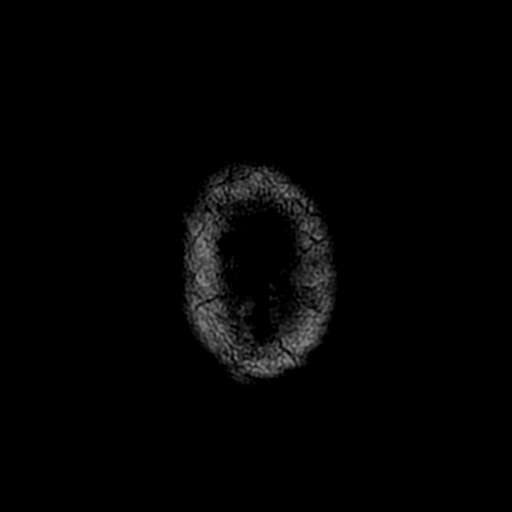

[Series 10: T2 post-contrast · coronal · 5.0mm · 0.47mm/px · 2 of 24 slices shown]
[im 1/24]
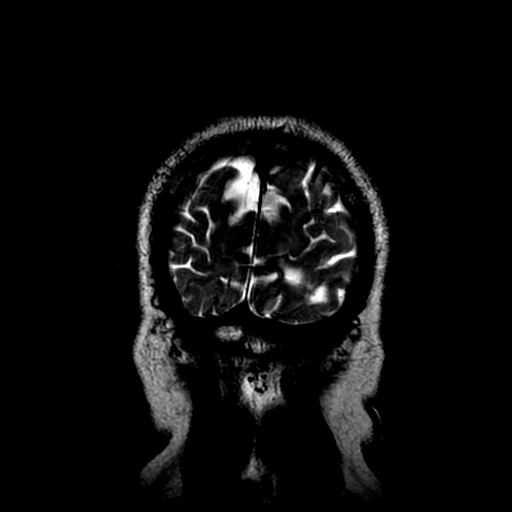
[im 24/24]
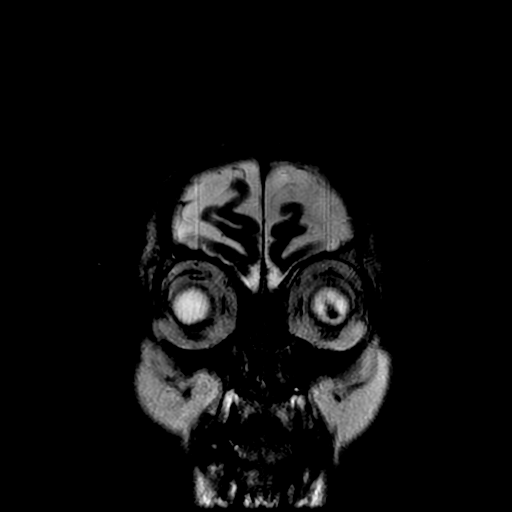

[Series 12: T1 post-contrast · coronal · 5.0mm · 0.47mm/px · 2 of 24 slices shown (1 of 2)]
[im 1/24]
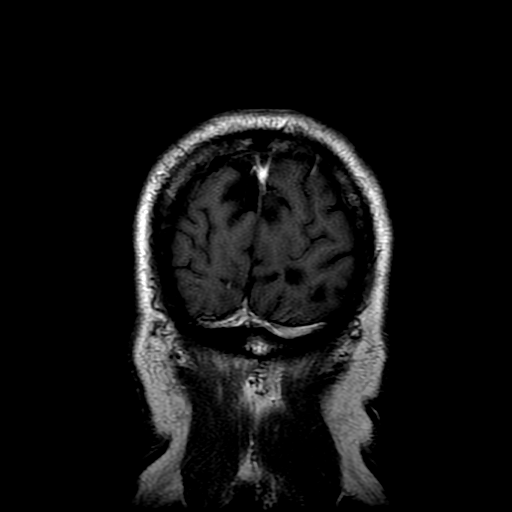
[im 24/24]
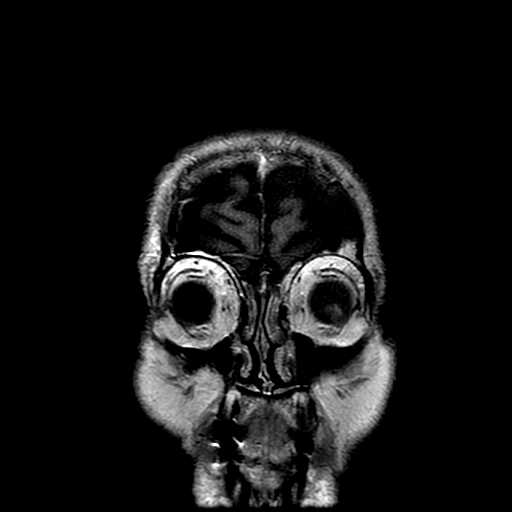

[Series 13: T1 post-contrast · sagittal · 5.0mm · 0.47mm/px · 2 of 24 slices shown (2 of 2)]
[im 1/24]
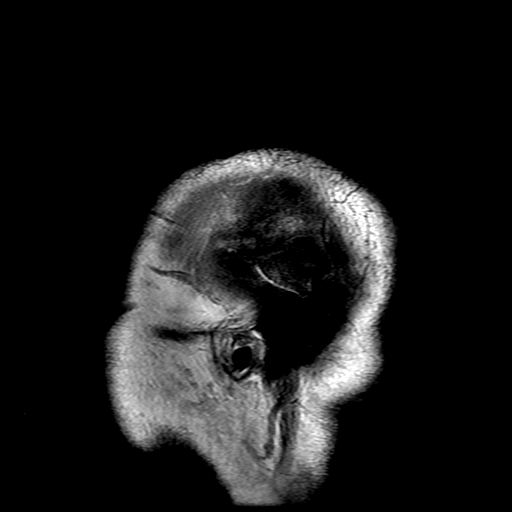
[im 24/24]
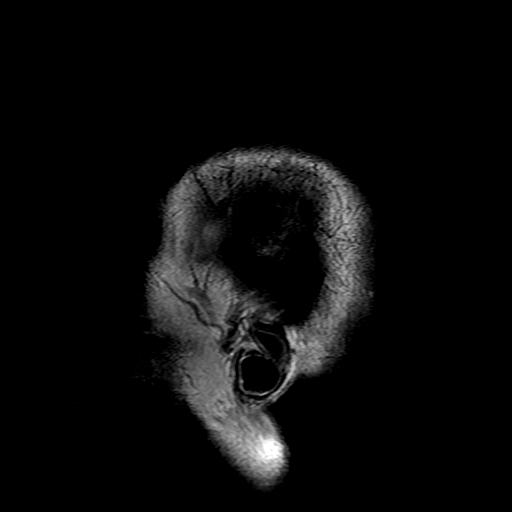

[Series 400: DWI · axial · 3.0mm · 1.09mm/px · z∈[-58,+91]mm · 4 of 51 slices shown (3 of 4)]
[im 1/51]
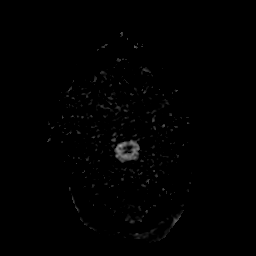
[im 17/51]
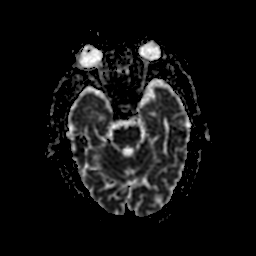
[im 34/51]
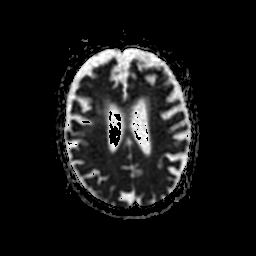
[im 51/51]
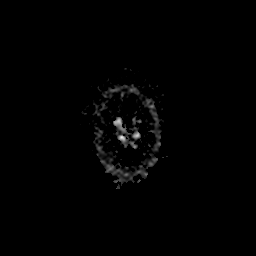

[Series 500: DWI · coronal · 5.0mm · 1.09mm/px · 3 of 34 slices shown (4 of 4)]
[im 1/34]
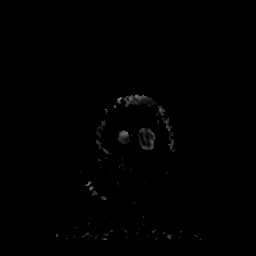
[im 17/34]
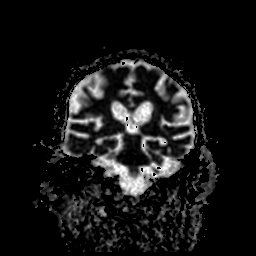
[im 34/34]
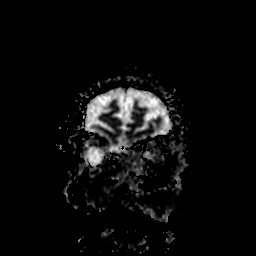

[32 of 48 positions shown; findings below may reference images not displayed]

FINDINGS: Brain: Evidence of intravenous iron or other diamagnetic
intravascular substance simulating the appearance of contrast even
on the noncontrast portion of the exam, and degrading T2* imaging.

Still, there is no evidence of intracranial mass or abnormal
enhancement. No intracranial mass effect. No evidence of dural
thickening.

Right anterior superior frontal gyrus cortical encephalomalacia with
mild white matter gliosis. Small areas of cortical encephalomalacia
also in the anterior left superior frontal gyrus (series 7, image
20) and in the left posterior occipital lobe (series 6, image 9).
Elsewhere minimal to mild for age nonspecific cerebral white matter
T2 and FLAIR hyperintensity. Deep gray matter nuclei and brainstem
are within normal limits. There are probably several tiny chronic
lacunar infarcts in both cerebellar hemispheres.

No restricted diffusion to suggest acute infarction. No midline
shift, mass effect, ventriculomegaly, extra-axial collection or
acute intracranial hemorrhage. Cervicomedullary junction and
pituitary are within normal limits.

Vascular: Major intracranial vascular flow voids are preserved,
dominant distal left vertebral artery.

Skull and upper cervical spine: Negative visualized cervical spinal
cord. There is heterogeneous bone marrow signal throughout the skull
and upper cervical spine, but no destructive osseous lesion
identified.

Sinuses/Orbits: Abnormal left globe with 10 mm decreased T2 signal
and increased T1 or enhancing signal (series 6, image 8). The other
left intraorbital soft tissues remain normal.

The right orbit appears normal. Mild paranasal sinus mucosal
thickening.

Other: Visible internal auditory structures appear normal. Mastoids
are clear. Negative scalp soft tissues.
IMPRESSION: 1. Mild artifact related to intravenous iron or other diet magnetic
intravascular substance.
However, the study is diagnostic with No metastatic disease to the
brain or acute intracranial abnormality.
2. A 10 mm left globe mass is compatible with the stated history of
left orbital melanoma involvement.
3. Chronic ischemic cerebral disease in the right greater than left
anterior MCA territories and left PCA territory.

## 2018-07-06 IMAGING — US US PARACENTESIS
1 series · 8 of 8 positions shown · non-contrast
Comparison: none

INDICATION: Metastatic adenocarcinoma. Recurrent abdominal ascites. Request
therapeutic paracentesis.

[Series 1: us paracentesis · 0.30mm/px · 8 of 8 slices shown]
[im 1/8]
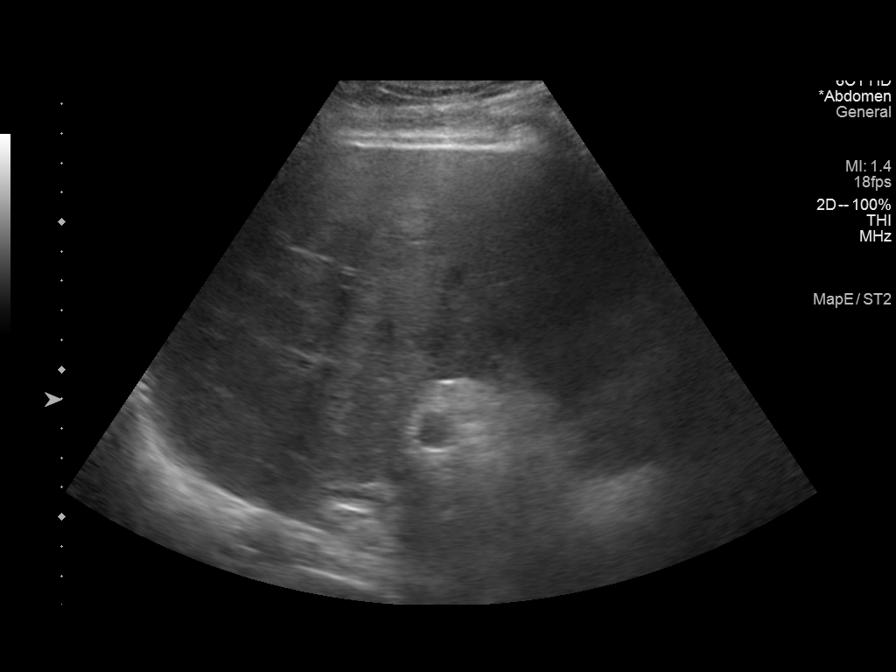
[im 2/8]
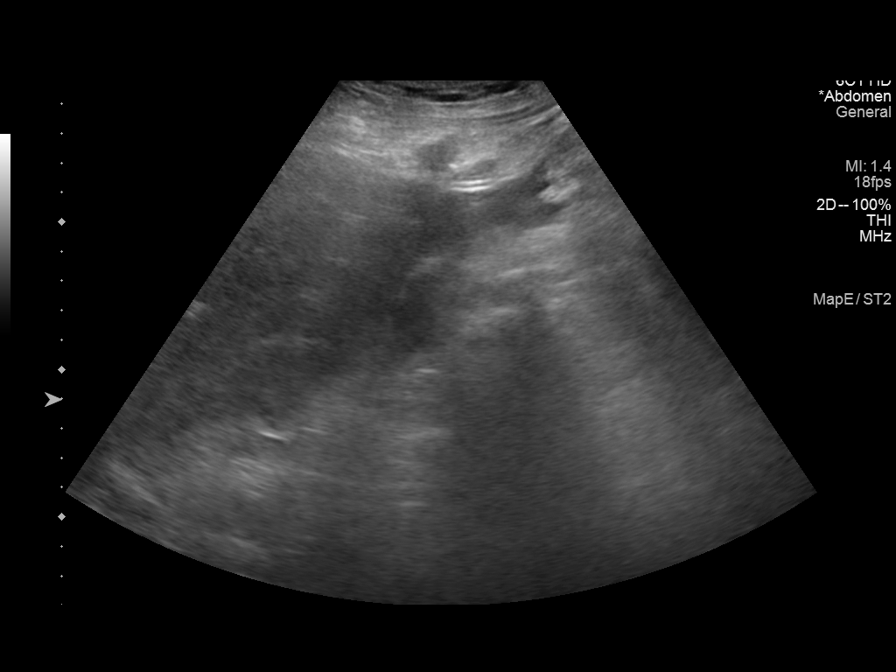
[im 3/8]
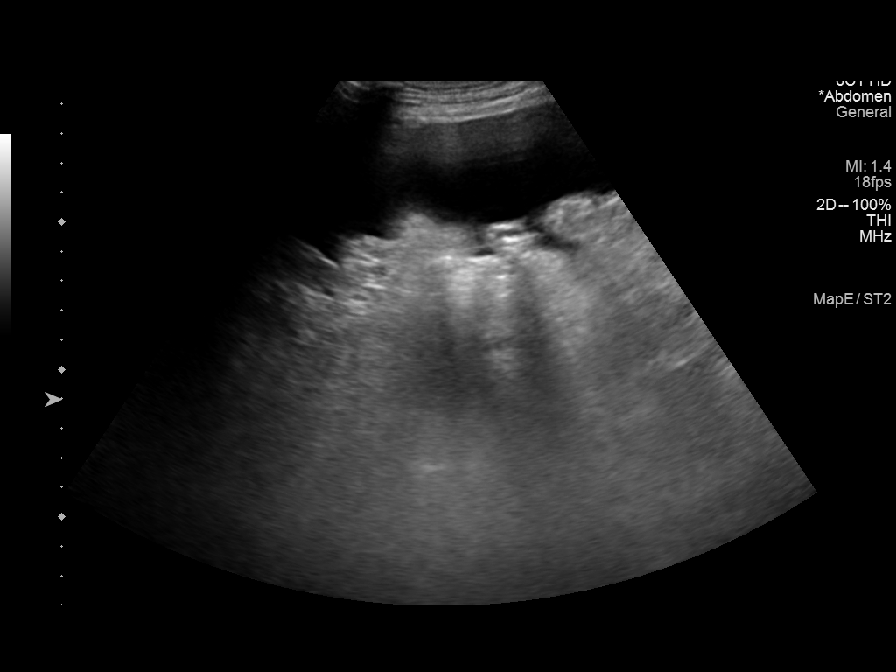
[im 4/8]
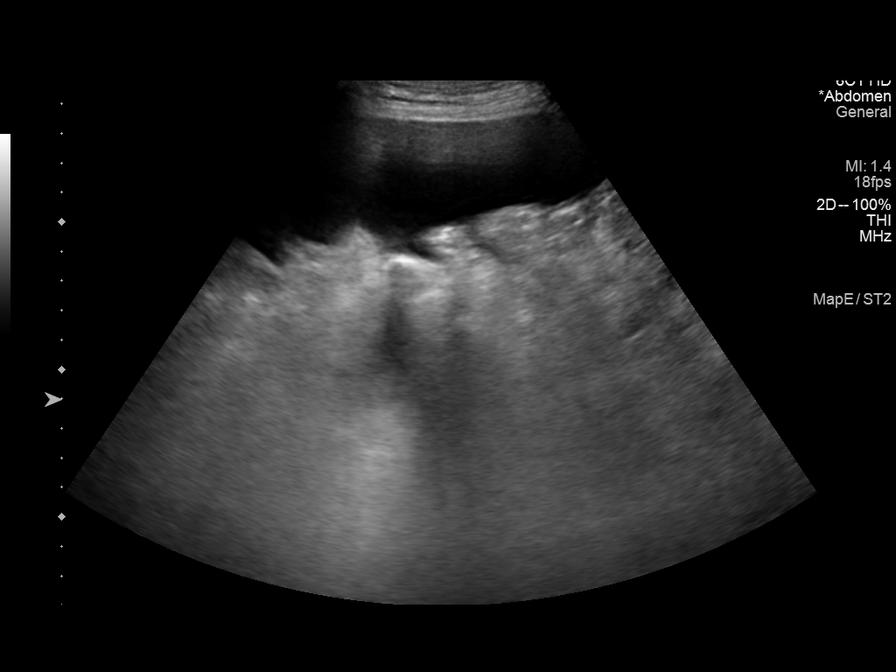
[im 5/8]
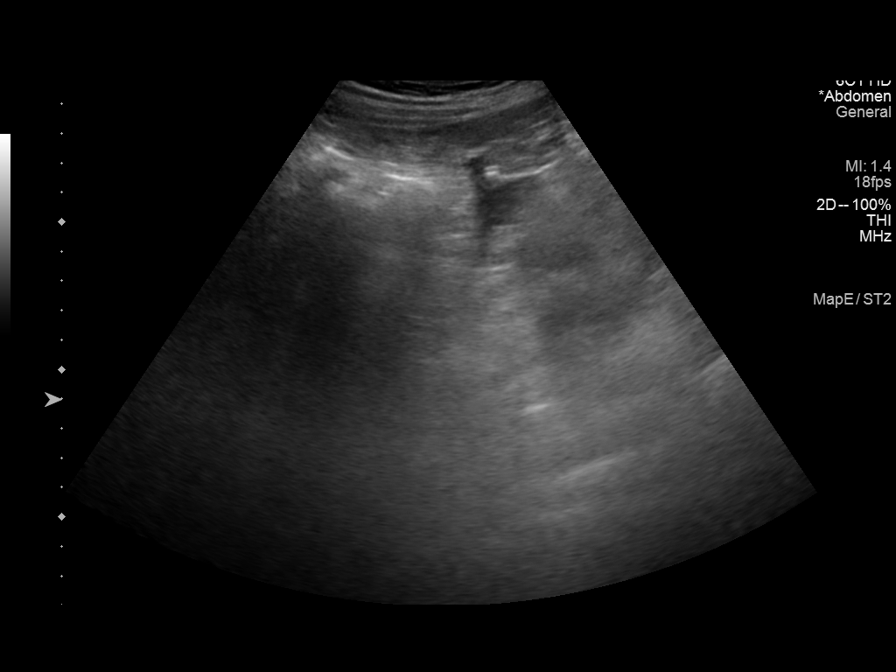
[im 6/8]
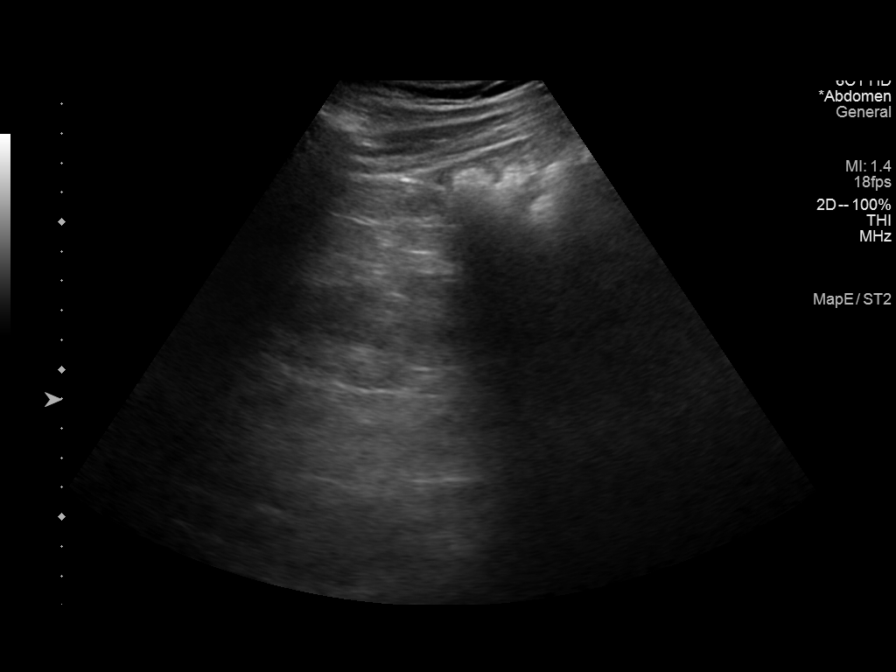
[im 7/8]
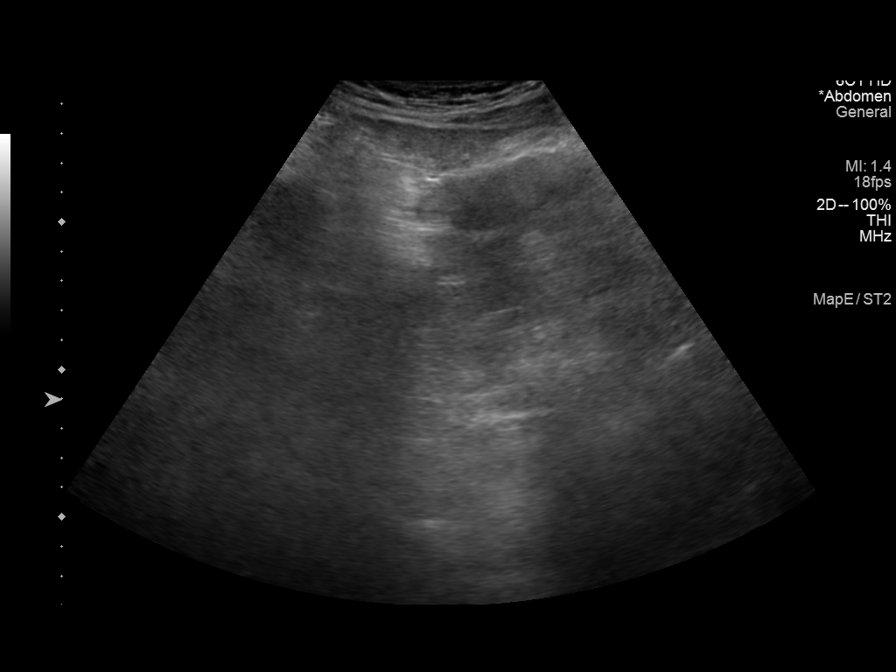
[im 8/8]
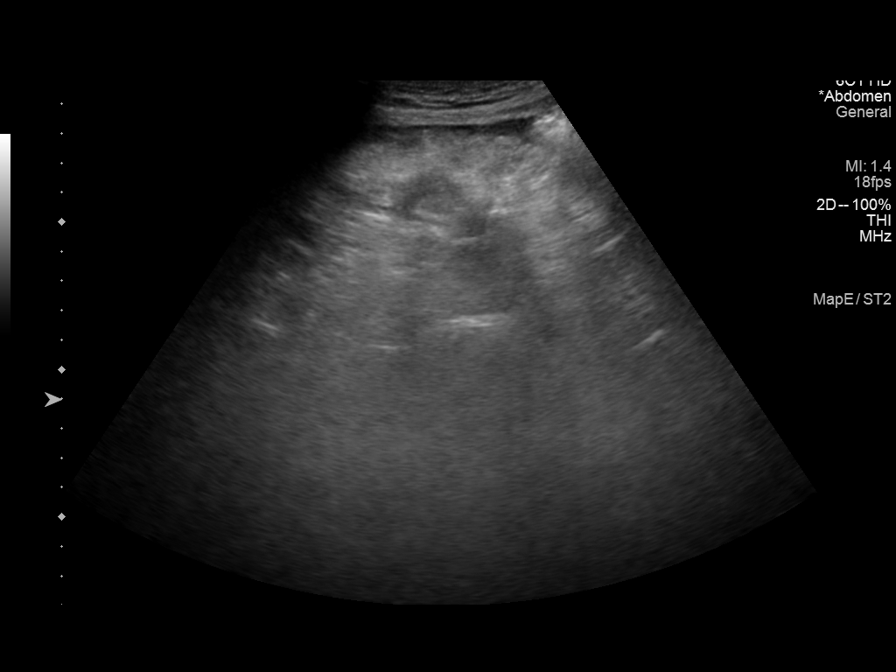

[8 of 8 positions shown; findings below may reference images not displayed]

EXAM:
ULTRASOUND GUIDED RIGHT LOWER QUADRANT PARACENTESIS

MEDICATIONS:
None.

COMPLICATIONS:
None immediate.

PROCEDURE:
Informed written consent was obtained from the patient after a
discussion of the risks, benefits and alternatives to treatment. A
timeout was performed prior to the initiation of the procedure.

Initial ultrasound scanning demonstrates a large amount of ascites
within the right lower abdominal quadrant. The right lower abdomen
was prepped and draped in the usual sterile fashion. 1% lidocaine
with epinephrine was used for local anesthesia.

Following this, a 19 gauge, 7-cm, Yueh catheter was introduced. An
ultrasound image was saved for documentation purposes. The
paracentesis was performed. The catheter was removed and a dressing
was applied. The patient tolerated the procedure well without
immediate post procedural complication.
FINDINGS: A total of approximately 2.5 L of blood-tinged fluid was removed.
IMPRESSION: Successful ultrasound-guided paracentesis yielding 2.5 liters of
peritoneal fluid.

## 2018-07-14 IMAGING — US US PARACENTESIS
1 series · 7 of 7 positions shown · non-contrast
Comparison: none

INDICATION: History of colorectal cancer with recurrent ascites. Request is made
for therapeutic paracentesis.

[Series 1: us paracentesis · 0.26mm/px · 7 of 7 slices shown]
[im 1/7]
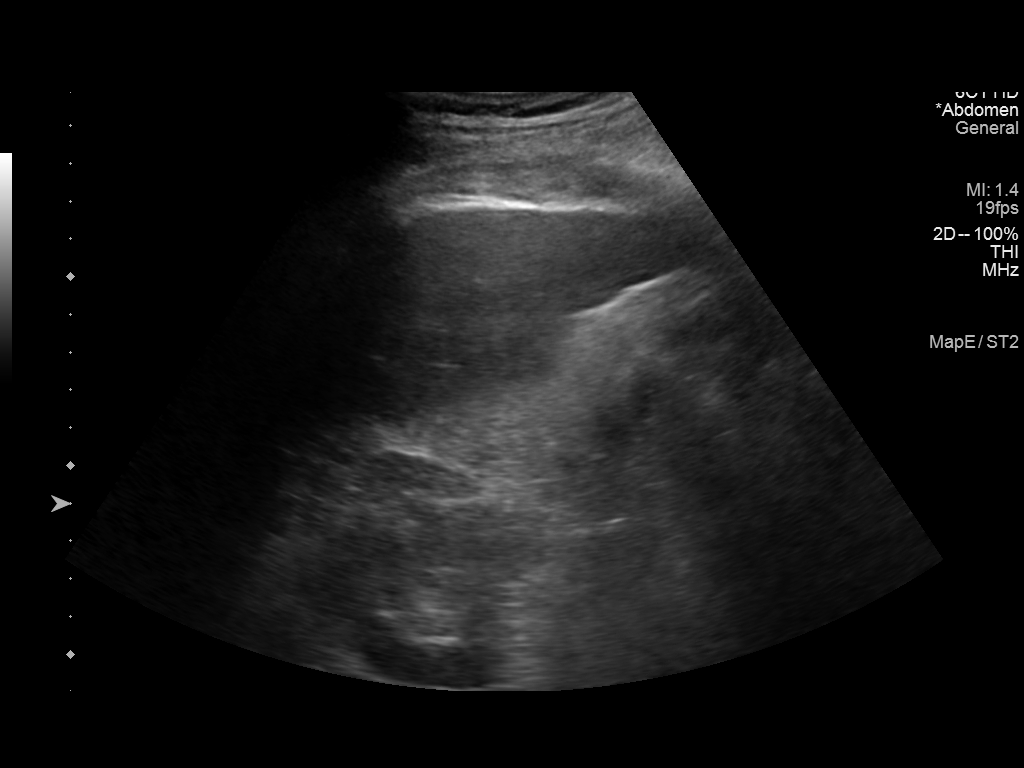
[im 2/7]
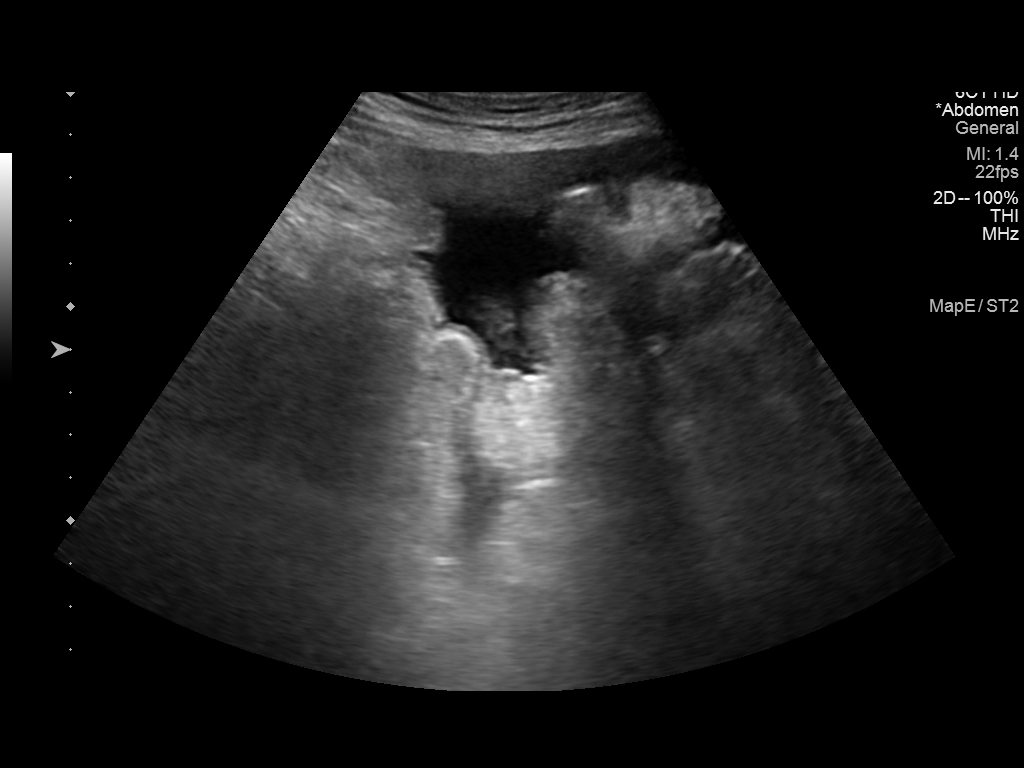
[im 3/7]
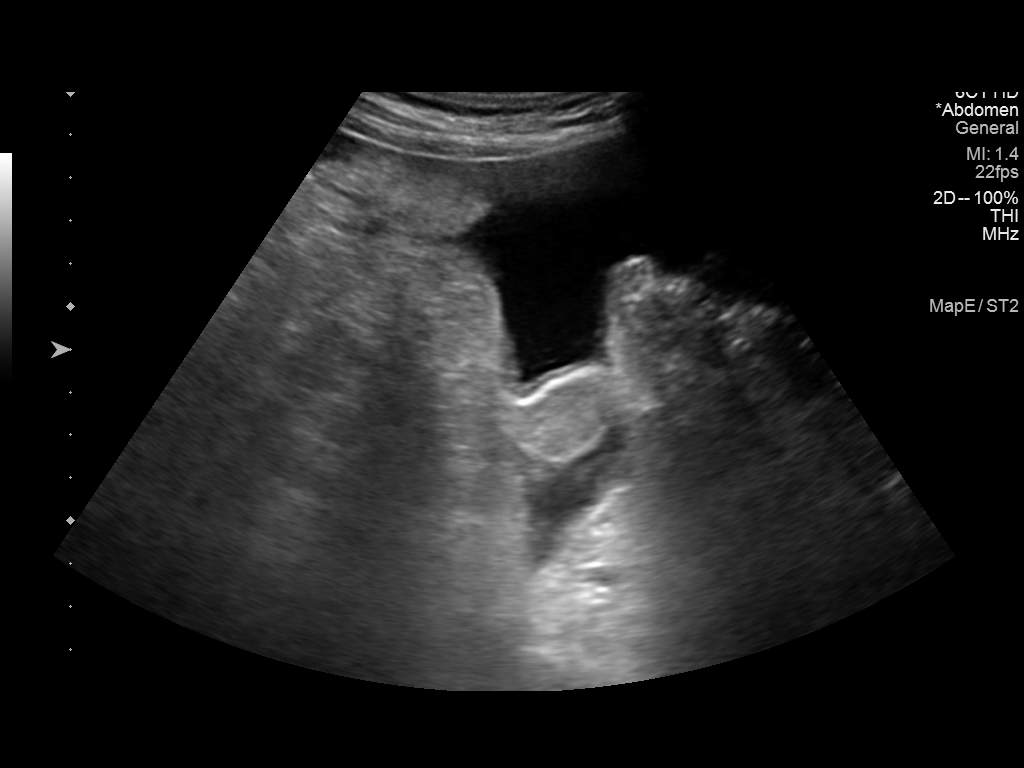
[im 4/7]
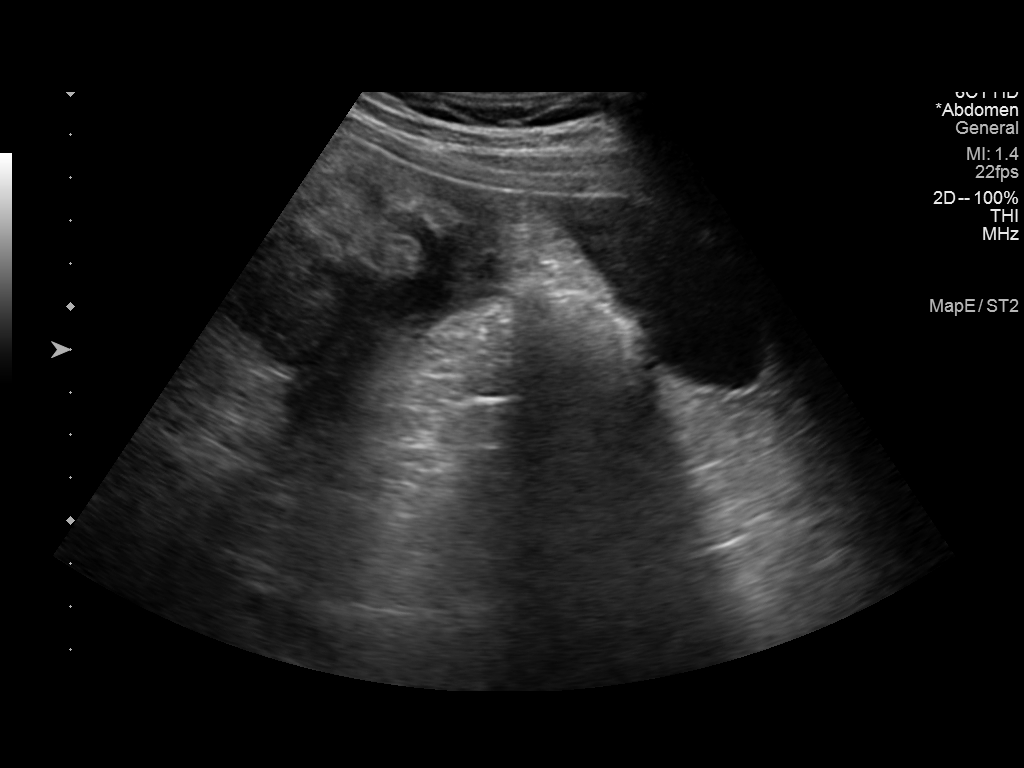
[im 5/7]
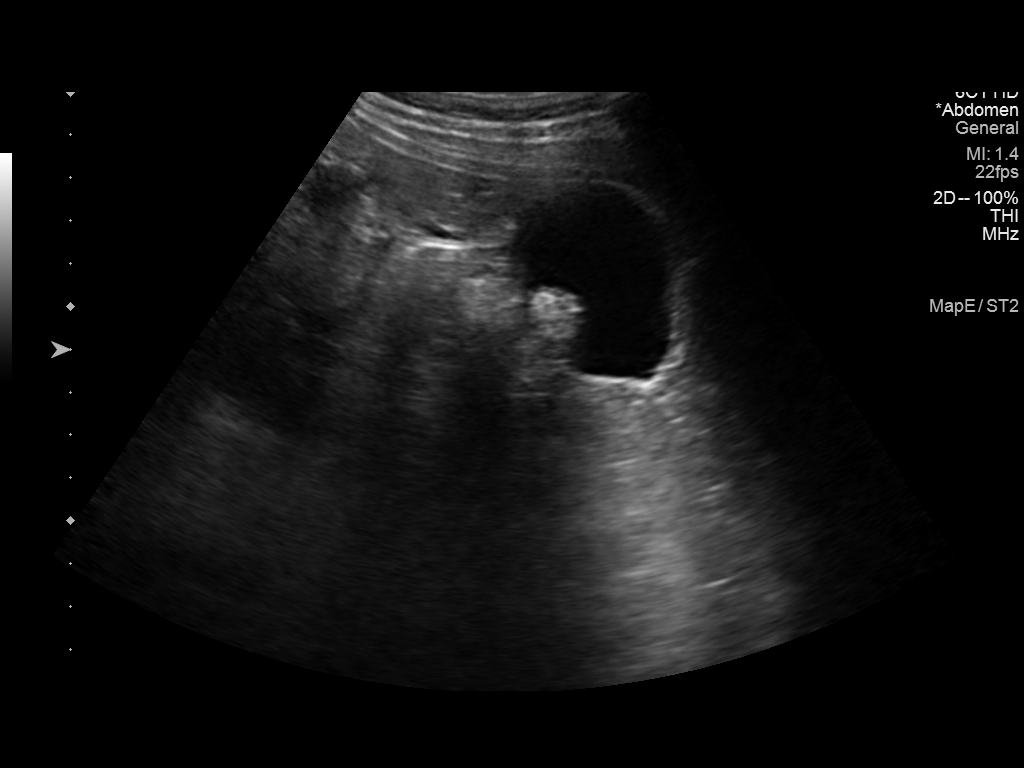
[im 6/7]
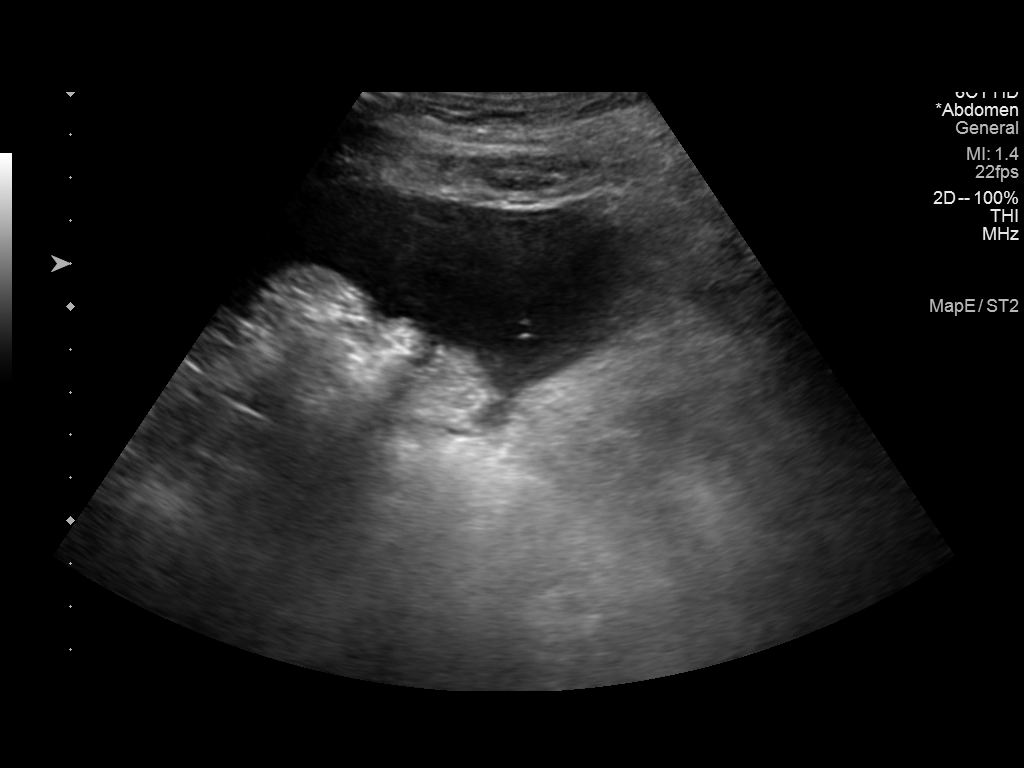
[im 7/7]
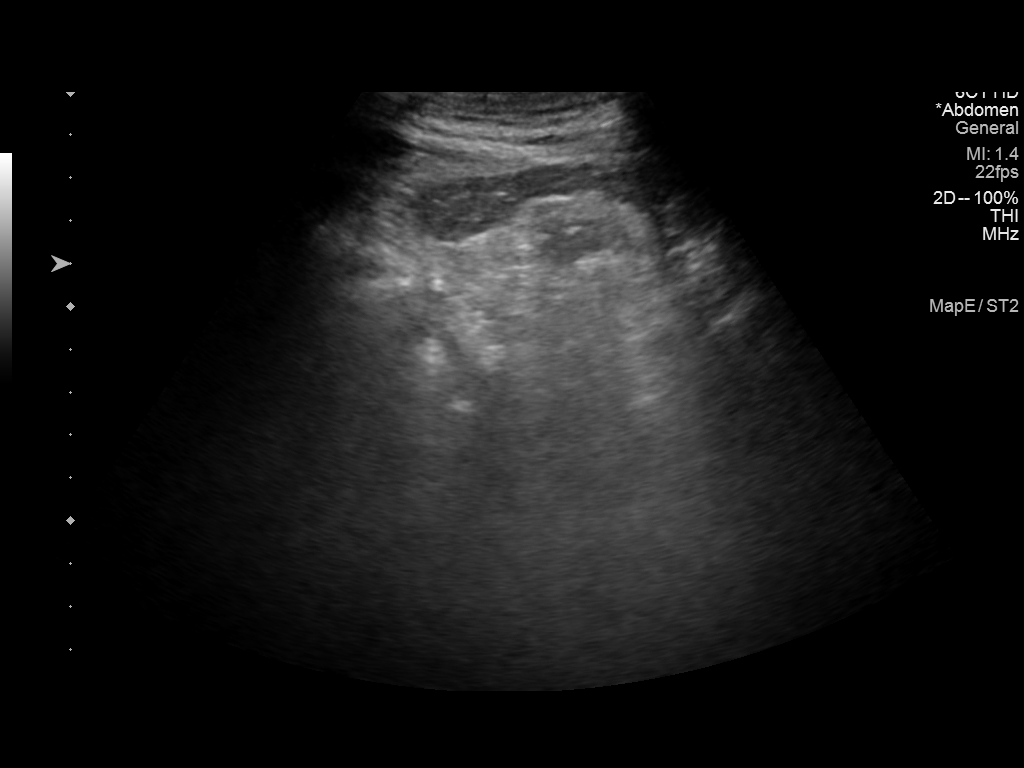

[7 of 7 positions shown; findings below may reference images not displayed]

EXAM:
ULTRASOUND GUIDED THERAPEUTIC PARACENTESIS

MEDICATIONS:
1% lidocaine.

COMPLICATIONS:
None immediate.

PROCEDURE:
Informed written consent was obtained from the patient after a
discussion of the risks, benefits and alternatives to treatment. A
timeout was performed prior to the initiation of the procedure.

Initial ultrasound scanning demonstrates a small amount of ascites
within the right lower abdominal quadrant. The right lower abdomen
was prepped and draped in the usual sterile fashion. 1% lidocaine
was used for local anesthesia.

Following this, a 19 gauge, 7-cm, Yueh catheter was introduced. An
ultrasound image was saved for documentation purposes. The
paracentesis was performed. The catheter was removed and a dressing
was applied. The patient tolerated the procedure well without
immediate post procedural complication.
FINDINGS: A total of approximately 2.8 L of amber fluid was removed.
IMPRESSION: Successful ultrasound-guided paracentesis yielding 2.8 liters of
peritoneal fluid.
